# Patient Record
Sex: Male | Born: 1970 | Race: Black or African American | Hispanic: No | Smoking: Never smoker
Health system: Southern US, Community
[De-identification: ages and names within clinical notes are randomized; demographics above are authoritative.]

## PROBLEM LIST (undated history)

## (undated) DIAGNOSIS — I10 Essential (primary) hypertension: Secondary | ICD-10-CM

## (undated) DIAGNOSIS — H538 Other visual disturbances: Secondary | ICD-10-CM

## (undated) DIAGNOSIS — R42 Dizziness and giddiness: Secondary | ICD-10-CM

## (undated) HISTORY — DX: Other visual disturbances: H53.8

## (undated) HISTORY — DX: Dizziness and giddiness: R42

---

## 2017-11-16 ENCOUNTER — Inpatient Hospital Stay (HOSPITAL_COMMUNITY)
Admission: EM | Admit: 2017-11-16 | Discharge: 2017-12-04 | DRG: 193 | Disposition: A | Discharge status: Home / Self Care | Payer: Self-pay | Attending: Family Medicine | Admitting: Family Medicine

## 2017-11-16 ENCOUNTER — Other Ambulatory Visit: Payer: Self-pay

## 2017-11-16 ENCOUNTER — Emergency Department (HOSPITAL_COMMUNITY): Payer: Self-pay

## 2017-11-16 ENCOUNTER — Encounter (HOSPITAL_COMMUNITY): Payer: Self-pay

## 2017-11-16 DIAGNOSIS — J9601 Acute respiratory failure with hypoxia: Secondary | ICD-10-CM

## 2017-11-16 DIAGNOSIS — J9690 Respiratory failure, unspecified, unspecified whether with hypoxia or hypercapnia: Secondary | ICD-10-CM

## 2017-11-16 DIAGNOSIS — I480 Paroxysmal atrial fibrillation: Secondary | ICD-10-CM | POA: Diagnosis present

## 2017-11-16 DIAGNOSIS — J90 Pleural effusion, not elsewhere classified: Secondary | ICD-10-CM

## 2017-11-16 DIAGNOSIS — R030 Elevated blood-pressure reading, without diagnosis of hypertension: Secondary | ICD-10-CM

## 2017-11-16 DIAGNOSIS — R748 Abnormal levels of other serum enzymes: Secondary | ICD-10-CM

## 2017-11-16 DIAGNOSIS — Z8249 Family history of ischemic heart disease and other diseases of the circulatory system: Secondary | ICD-10-CM

## 2017-11-16 DIAGNOSIS — I10 Essential (primary) hypertension: Secondary | ICD-10-CM

## 2017-11-16 DIAGNOSIS — R0600 Dyspnea, unspecified: Secondary | ICD-10-CM

## 2017-11-16 DIAGNOSIS — R079 Chest pain, unspecified: Secondary | ICD-10-CM

## 2017-11-16 DIAGNOSIS — Z9889 Other specified postprocedural states: Secondary | ICD-10-CM

## 2017-11-16 DIAGNOSIS — J189 Pneumonia, unspecified organism: Secondary | ICD-10-CM | POA: Diagnosis present

## 2017-11-16 DIAGNOSIS — F419 Anxiety disorder, unspecified: Secondary | ICD-10-CM | POA: Diagnosis present

## 2017-11-16 DIAGNOSIS — R740 Nonspecific elevation of levels of transaminase and lactic acid dehydrogenase [LDH]: Secondary | ICD-10-CM | POA: Diagnosis present

## 2017-11-16 DIAGNOSIS — R0602 Shortness of breath: Secondary | ICD-10-CM

## 2017-11-16 DIAGNOSIS — A1889 Tuberculosis of other sites: Secondary | ICD-10-CM

## 2017-11-16 DIAGNOSIS — I4892 Unspecified atrial flutter: Secondary | ICD-10-CM | POA: Diagnosis present

## 2017-11-16 DIAGNOSIS — I313 Pericardial effusion (noninflammatory): Secondary | ICD-10-CM | POA: Diagnosis present

## 2017-11-16 DIAGNOSIS — J180 Bronchopneumonia, unspecified organism: Principal | ICD-10-CM | POA: Diagnosis present

## 2017-11-16 DIAGNOSIS — A159 Respiratory tuberculosis unspecified: Secondary | ICD-10-CM | POA: Diagnosis present

## 2017-11-16 DIAGNOSIS — J45909 Unspecified asthma, uncomplicated: Secondary | ICD-10-CM | POA: Diagnosis present

## 2017-11-16 HISTORY — DX: Essential (primary) hypertension: I10

## 2017-11-16 LAB — HEPATIC FUNCTION PANEL
ALT: 33 U/L (ref 17–63)
AST: 20 U/L (ref 15–41)
Albumin: 2.5 g/dL — ABNORMAL LOW (ref 3.5–5.0)
Alkaline Phosphatase: 138 U/L — ABNORMAL HIGH (ref 38–126)
BILIRUBIN DIRECT: 0.2 mg/dL (ref 0.1–0.5)
Indirect Bilirubin: 0.6 mg/dL (ref 0.3–0.9)
Total Bilirubin: 0.8 mg/dL (ref 0.3–1.2)
Total Protein: 7.1 g/dL (ref 6.5–8.1)

## 2017-11-16 LAB — BASIC METABOLIC PANEL
ANION GAP: 11 (ref 5–15)
BUN: 10 mg/dL (ref 6–20)
CHLORIDE: 103 mmol/L (ref 101–111)
CO2: 23 mmol/L (ref 22–32)
Calcium: 8.5 mg/dL — ABNORMAL LOW (ref 8.9–10.3)
Creatinine, Ser: 1.17 mg/dL (ref 0.61–1.24)
Glucose, Bld: 108 mg/dL — ABNORMAL HIGH (ref 65–99)
POTASSIUM: 3.9 mmol/L (ref 3.5–5.1)
SODIUM: 137 mmol/L (ref 135–145)

## 2017-11-16 LAB — BRAIN NATRIURETIC PEPTIDE: B NATRIURETIC PEPTIDE 5: 99.3 pg/mL (ref 0.0–100.0)

## 2017-11-16 LAB — I-STAT TROPONIN, ED: Troponin i, poc: 0 ng/mL (ref 0.00–0.08)

## 2017-11-16 LAB — CBC
HEMATOCRIT: 36 % — AB (ref 39.0–52.0)
HEMOGLOBIN: 11.7 g/dL — AB (ref 13.0–17.0)
MCH: 25.6 pg — ABNORMAL LOW (ref 26.0–34.0)
MCHC: 32.5 g/dL (ref 30.0–36.0)
MCV: 78.8 fL (ref 78.0–100.0)
Platelets: 457 10*3/uL — ABNORMAL HIGH (ref 150–400)
RBC: 4.57 MIL/uL (ref 4.22–5.81)
RDW: 13.1 % (ref 11.5–15.5)
WBC: 12.8 10*3/uL — AB (ref 4.0–10.5)

## 2017-11-16 LAB — I-STAT CG4 LACTIC ACID, ED
Lactic Acid, Venous: 0.64 mmol/L (ref 0.5–1.9)
Lactic Acid, Venous: 0.91 mmol/L (ref 0.5–1.9)

## 2017-11-16 LAB — TSH: TSH: 1.481 u[IU]/mL (ref 0.350–4.500)

## 2017-11-16 MED ORDER — LEVOFLOXACIN IN D5W 750 MG/150ML IV SOLN
750.0000 mg | Freq: Once | INTRAVENOUS | Status: AC
  Administered 2017-11-16: 750 mg via INTRAVENOUS
  Filled 2017-11-16: qty 150

## 2017-11-16 MED ORDER — HYDRALAZINE HCL 20 MG/ML IJ SOLN
10.0000 mg | Freq: Once | INTRAMUSCULAR | Status: AC
  Administered 2017-11-16: 10 mg via INTRAVENOUS
  Filled 2017-11-16: qty 1

## 2017-11-16 MED ORDER — SODIUM CHLORIDE 0.9 % IV SOLN: 250.0000 mL | INTRAVENOUS | Status: DC | PRN

## 2017-11-16 MED ORDER — IOPAMIDOL (ISOVUE-300) INJECTION 61%
INTRAVENOUS | Status: AC
  Administered 2017-11-16: 100 mL
  Filled 2017-11-16: qty 100

## 2017-11-16 MED ORDER — ACETAMINOPHEN 325 MG PO TABS: 650.0000 mg | ORAL_TABLET | Freq: Four times a day (QID) | ORAL | Status: DC | PRN

## 2017-11-16 MED ORDER — ONDANSETRON HCL 4 MG/2ML IJ SOLN: 4.0000 mg | Freq: Four times a day (QID) | INTRAMUSCULAR | Status: DC | PRN

## 2017-11-16 MED ORDER — SODIUM CHLORIDE 0.9 % IV BOLUS (SEPSIS): 1000.0000 mL | Freq: Once | INTRAVENOUS | Status: DC

## 2017-11-16 MED ORDER — SODIUM CHLORIDE 0.9% FLUSH: 3.0000 mL | INTRAVENOUS | Status: DC | PRN

## 2017-11-16 MED ORDER — ACETAMINOPHEN 650 MG RE SUPP: 650.0000 mg | Freq: Four times a day (QID) | RECTAL | Status: DC | PRN

## 2017-11-16 MED ORDER — HYDROCODONE-ACETAMINOPHEN 5-325 MG PO TABS
1.0000 | ORAL_TABLET | ORAL | Status: DC | PRN
  Administered 2017-11-16 – 2017-11-17 (×2): 2 via ORAL
  Administered 2017-11-18: 1 via ORAL
  Administered 2017-11-18: 2 via ORAL
  Administered 2017-11-19: 1 via ORAL
  Administered 2017-11-19: 2 via ORAL
  Administered 2017-11-20 (×3): 1 via ORAL
  Administered 2017-11-21 (×3): 2 via ORAL
  Administered 2017-11-22: 1 via ORAL
  Administered 2017-11-22 – 2017-11-24 (×3): 2 via ORAL
  Filled 2017-11-16: qty 1
  Filled 2017-11-16 (×4): qty 2
  Filled 2017-11-16: qty 1
  Filled 2017-11-16 (×3): qty 2
  Filled 2017-11-16: qty 1
  Filled 2017-11-16 (×2): qty 2
  Filled 2017-11-16: qty 1
  Filled 2017-11-16 (×4): qty 2

## 2017-11-16 MED ORDER — ONDANSETRON HCL 4 MG PO TABS: 4.0000 mg | ORAL_TABLET | Freq: Four times a day (QID) | ORAL | Status: DC | PRN

## 2017-11-16 MED ORDER — SODIUM CHLORIDE 0.9% FLUSH
3.0000 mL | Freq: Two times a day (BID) | INTRAVENOUS | Status: DC
  Administered 2017-11-16 – 2017-12-03 (×32): 3 mL via INTRAVENOUS

## 2017-11-16 MED ORDER — MORPHINE SULFATE (PF) 4 MG/ML IV SOLN
4.0000 mg | Freq: Once | INTRAVENOUS | Status: AC
  Administered 2017-11-16: 4 mg via INTRAVENOUS
  Filled 2017-11-16: qty 1

## 2017-11-16 MED ORDER — LEVOFLOXACIN IN D5W 750 MG/150ML IV SOLN
750.0000 mg | INTRAVENOUS | Status: DC
  Administered 2017-11-17: 750 mg via INTRAVENOUS
  Filled 2017-11-16: qty 150

## 2017-11-16 NOTE — ED Triage Notes (Addendum)
Pt. Reports having chest pain and sob for 3 weeks.  The pain is in the center of his chest into his back.  Sharp pain, Pt. Also reports having cough and cold symptoms.  Pt. Is taking Goody Powders which relieves the pain. Pt. Is alert and oriented X4.Marland Kitchen. ECG and labs completed in Triage.  Pt. Also reports having pain with movement.

## 2017-11-16 NOTE — ED Notes (Signed)
Pt care assumed.  Verbal report obtained.  Pt is resting and appears comfortable.  Pt is requesting for something to eat.  Made pt aware that he is only ordered to have full liquid diet.  He requested water.  Pt reports R upper cp which he reports was the reason he came to the ED today.  Pt is A&Ox 4.

## 2017-11-16 NOTE — ED Notes (Signed)
PA at the bedside.

## 2017-11-16 NOTE — ED Notes (Signed)
Pt returned from CT °

## 2017-11-16 NOTE — H&P (Signed)
History and Physical    Scott Rowe RUE:[id number] DOB: 04/27/1971 DOA: 11/16/2017  PCP: Patient, No Pcp Per  Patient coming from: Home  Chief Complaint: Shortness of breath and cough  HPI: Scott Rowe is a 47 y.o. male with medical history significant of  hypertension comes in with 3 weeks of worsening shortness of breath and cough.  Patient reports that he has been coughing which has been productive and having a difficulty breathing with chest pain for 3 weeks now.  The pain is worse with deep inspiration.  He denies any fevers.  He has been having chills.  He denies any lower extremity edema.  He does not have a history of any congestive heart failure.  He does have hypertension.  He denies any nausea vomiting or diarrhea.  Patient found to have pneumonia and a left sided pleural effusion.  He is hypoxic with exertion.  He is referred for admission for community acquired pneumonia.  He has not seen a physician for this and is not been on antibiotics in over a year.  He has not had any recent hospitalizations.  Review of Systems: As per HPI otherwise 10 point review of systems negative.   Past Medical History:  Diagnosis Date  . Hypertension     History reviewed. No pertinent surgical history.   reports that  has never smoked. he has never used smokeless tobacco. He reports that he does not drink alcohol or use drugs.  No Known Allergies  No family history on file.  No premature coronary artery disease  Prior to Admission medications   Not on File  None  Physical Exam: Vitals:   11/16/17 1330 11/16/17 1345 11/16/17 1400 11/16/17 1445  BP: (!) 158/108 (!) 154/117 (!) 164/117 (!) 170/116  Pulse: (!) 108 (!) 106 (!) 108 (!) 102  Resp:      Temp:      TempSrc:      SpO2: 98% 98% 98% 96%  Weight:      Height:          Constitutional: NAD, calm, comfortable Vitals:   11/16/17 1330 11/16/17 1345 11/16/17 1400 11/16/17 1445  BP: (!) 158/108 (!) 154/117 (!) 164/117 (!) 170/116   Pulse: (!) 108 (!) 106 (!) 108 (!) 102  Resp:      Temp:      TempSrc:      SpO2: 98% 98% 98% 96%  Weight:      Height:       Eyes: PERRL, lids and conjunctivae normal ENMT: Mucous membranes are moist. Posterior pharynx clear of any exudate or lesions.Normal dentition.  Neck: normal, supple, no masses, no thyromegaly Respiratory: clear to auscultation  on the right decreased on the left to mid lung, no wheezing, no crackles. Normal respiratory effort. No accessory muscle use.  Cardiovascular: Regular rate and rhythm, no murmurs / rubs / gallops. No extremity edema. 2+ pedal pulses. No carotid bruits.  Abdomen: no tenderness, no masses palpated. No hepatosplenomegaly. Bowel sounds positive.  Musculoskeletal: no clubbing / cyanosis. No joint deformity upper and lower extremities. Good ROM, no contractures. Normal muscle tone.  Skin: no rashes, lesions, ulcers. No induration Neurologic: CN 2-12 grossly intact. Sensation intact, DTR normal. Strength 5/5 in all 4.  Psychiatric: Normal judgment and insight. Alert and oriented x 3. Normal mood.    Labs on Admission: I have personally reviewed following labs and imaging studies  CBC: Recent Labs  Lab 11/16/17 1108  WBC 12.8*  HGB 11.7*  HCT  36.0*  MCV 78.8  PLT 457*   Basic Metabolic Panel: Recent Labs  Lab 11/16/17 1108  NA 137  K 3.9  CL 103  CO2 23  GLUCOSE 108*  BUN 10  CREATININE 1.17  CALCIUM 8.5*   GFR: Estimated Creatinine Clearance: 86 mL/min (by C-G formula based on SCr of 1.17 mg/dL). Liver Function Tests: Recent Labs  Lab 11/16/17 1256  AST 20  ALT 33  ALKPHOS 138*  BILITOT 0.8  PROT 7.1  ALBUMIN 2.5*   No results for input(s): LIPASE, AMYLASE in the last 168 hours. No results for input(s): AMMONIA in the last 168 hours. Coagulation Profile: No results for input(s): INR, PROTIME in the last 168 hours. Cardiac Enzymes: No results for input(s): CKTOTAL, CKMB, CKMBINDEX, TROPONINI in the last 168  hours. BNP (last 3 results) No results for input(s): PROBNP in the last 8760 hours. HbA1C: No results for input(s): HGBA1C in the last 72 hours. CBG: No results for input(s): GLUCAP in the last 168 hours. Lipid Profile: No results for input(s): CHOL, HDL, LDLCALC, TRIG, CHOLHDL, LDLDIRECT in the last 72 hours. Thyroid Function Tests: No results for input(s): TSH, T4TOTAL, FREET4, T3FREE, THYROIDAB in the last 72 hours. Anemia Panel: No results for input(s): VITAMINB12, FOLATE, FERRITIN, TIBC, IRON, RETICCTPCT in the last 72 hours. Urine analysis: No results found for: COLORURINE, APPEARANCEUR, LABSPEC, PHURINE, GLUCOSEU, HGBUR, BILIRUBINUR, KETONESUR, PROTEINUR, UROBILINOGEN, NITRITE, LEUKOCYTESUR Sepsis Labs: !!!!!!!!!!!!!!!!!!!!!!!!!!!!!!!!!!!!!!!!!!!! @LABRCNTIP (procalcitonin:4,lacticidven:4) )No results found for this or any previous visit (from the past 240 hour(s)).   Radiological Exams on Admission: Dg Chest 2 View  Result Date: 11/16/2017 CLINICAL DATA:  Chest pain and shortness of breath for several weeks EXAM: CHEST  2 VIEW COMPARISON:  None. FINDINGS: Cardiac shadow is mildly enlarged. Small right-sided pleural effusion is seen. A large left-sided pleural effusion is noted with likely underlying infiltrate. No pneumothorax is seen. No bony abnormality is noted. IMPRESSION: Large left pleural effusion with likely underlying infiltrate. Minimal blunting of the right costophrenic angle is noted as well. Electronically Signed   By: Alcide Clever M.D.   On: 11/16/2017 11:55   Ct Chest W Contrast  Result Date: 11/16/2017 CLINICAL DATA:  RIGHT upper quadrant RIGHT-side chest pain with shortness of breath for 2 weeks, severe chest pain when lying flat, unable to raise RIGHT arm above head, shortness of breath for 2 weeks, history hypertension EXAM: CT CHEST, ABDOMEN, AND PELVIS WITH CONTRAST TECHNIQUE: Multidetector CT imaging of the chest, abdomen and pelvis was performed following the  standard protocol during bolus administration of intravenous contrast. Sagittal and coronal MPR images reconstructed from axial data set. Patient was unable to raise his RIGHT arm above his head, resulting in beam hardening artifacts at the chest and abdomen. CONTRAST:  ISOVUE-300 IOPAMIDOL (ISOVUE-300) INJECTION 61% IV. No oral contrast administered. COMPARISON:  None FINDINGS: CT CHEST FINDINGS Cardiovascular: Thoracic vascular structures grossly patent on nondedicated exam. Small pericardial effusion. Mediastinum/Nodes: Normal sized RIGHT cardiophrenic angle lymph nodes. Enlarged azygo-esophageal recess node 16 mm short axis image 32. Minimally enlarged RIGHT paratracheal node 11 mm short axis image 26. No definite esophageal abnormalities. Base of cervical region unremarkable. Lungs/Pleura: Large LEFT pleural effusion with subtotal atelectasis of LEFT lower lobe and partial atelectasis of LEFT upper lobe in lingula. Small RIGHT pleural effusion with minimal compressive atelectasis of adjacent posterior RIGHT lower lobe base. Scattered respiratory motion artifacts. No definite infiltrate or pneumothorax. Musculoskeletal: No acute osseous abnormalities. CT ABDOMEN PELVIS FINDINGS Hepatobiliary: Gallbladder and liver normal appearance  Pancreas: Normal appearance Spleen: Normal appearance Adrenals/Urinary Tract: Adrenal glands, kidneys, ureters, and bladder normal appearance Stomach/Bowel: Normal appearing retrocecal appendix. Distal stomach incompletely distended. Stomach and bowel loops otherwise normal appearance for technique Vascular/Lymphatic: Vascular structures normal appearance. No adenopathy. Reproductive: Minimal prostatic enlargement. Other: No free air, free fluid, hernia, or inflammatory process Musculoskeletal: No acute osseous abnormalities. IMPRESSION: Large LEFT pleural effusion with significant atelectasis of the LEFT lung. Small RIGHT pleural effusion with minimal atelectasis of the  adjacent RIGHT lower lobe. Nonspecific mildly enlarged mediastinal lymph nodes. No acute intra-abdominal or intrapelvic abnormalities. No acute osseous abnormalities. Electronically Signed   By: Ulyses Southward M.D.   On: 11/16/2017 13:53   Ct Abdomen Pelvis W Contrast  Result Date: 11/16/2017 CLINICAL DATA:  RIGHT upper quadrant RIGHT-side chest pain with shortness of breath for 2 weeks, severe chest pain when lying flat, unable to raise RIGHT arm above head, shortness of breath for 2 weeks, history hypertension EXAM: CT CHEST, ABDOMEN, AND PELVIS WITH CONTRAST TECHNIQUE: Multidetector CT imaging of the chest, abdomen and pelvis was performed following the standard protocol during bolus administration of intravenous contrast. Sagittal and coronal MPR images reconstructed from axial data set. Patient was unable to raise his RIGHT arm above his head, resulting in beam hardening artifacts at the chest and abdomen. CONTRAST:  ISOVUE-300 IOPAMIDOL (ISOVUE-300) INJECTION 61% IV. No oral contrast administered. COMPARISON:  None FINDINGS: CT CHEST FINDINGS Cardiovascular: Thoracic vascular structures grossly patent on nondedicated exam. Small pericardial effusion. Mediastinum/Nodes: Normal sized RIGHT cardiophrenic angle lymph nodes. Enlarged azygo-esophageal recess node 16 mm short axis image 32. Minimally enlarged RIGHT paratracheal node 11 mm short axis image 26. No definite esophageal abnormalities. Base of cervical region unremarkable. Lungs/Pleura: Large LEFT pleural effusion with subtotal atelectasis of LEFT lower lobe and partial atelectasis of LEFT upper lobe in lingula. Small RIGHT pleural effusion with minimal compressive atelectasis of adjacent posterior RIGHT lower lobe base. Scattered respiratory motion artifacts. No definite infiltrate or pneumothorax. Musculoskeletal: No acute osseous abnormalities. CT ABDOMEN PELVIS FINDINGS Hepatobiliary: Gallbladder and liver normal appearance Pancreas: Normal  appearance Spleen: Normal appearance Adrenals/Urinary Tract: Adrenal glands, kidneys, ureters, and bladder normal appearance Stomach/Bowel: Normal appearing retrocecal appendix. Distal stomach incompletely distended. Stomach and bowel loops otherwise normal appearance for technique Vascular/Lymphatic: Vascular structures normal appearance. No adenopathy. Reproductive: Minimal prostatic enlargement. Other: No free air, free fluid, hernia, or inflammatory process Musculoskeletal: No acute osseous abnormalities. IMPRESSION: Large LEFT pleural effusion with significant atelectasis of the LEFT lung. Small RIGHT pleural effusion with minimal atelectasis of the adjacent RIGHT lower lobe. Nonspecific mildly enlarged mediastinal lymph nodes. No acute intra-abdominal or intrapelvic abnormalities. No acute osseous abnormalities. Electronically Signed   By: Ulyses Southward M.D.   On: 11/16/2017 13:53    EKG: Independently reviewed.  Sinus tachycardia T wave inversion in lateral leads Case discussed with EDP Chest x-ray reviewed with bilateral pleural effusions larger on the right  Assessment/Plan 47 year old male with bilateral pneumonia with associated large left pleural effusion with a small right pleural effusion Principal Problem:   PNA (pneumonia)-placed on IV Levaquin.  Place on telemetry bed.  Check BNP.  Obtain blood and sputum cultures.  Supplemental oxygen as needed.  Depending on how patient responds to antibiotics he may need a thoracentesis.  He is currently stable.  Check HIV.  Active Problems:   Acute respiratory failure with hypoxia (HCC)-due to pneumonia.  Highly suspect this is pneumonia.  However cannot be certain not congestive heart failure will obtain a  cardiac echo to ascertain systolic function.   Hypertension-noted not on any home meds for this.  Monitor while in the hospital.  We will not start anything at this time.   Pleural effusion-as above    DVT prophylaxis: SCDs Code Status:  Full Family Communication: Significant other Disposition Plan: Per day team Consults called: None Admission status: Admission   Pauletta Pickney A MD [facility]  If 7PM-7AM, please contact night-coverage [email or web address] Password [facility]  11/16/2017, 3:15 PM

## 2017-11-16 NOTE — ED Provider Notes (Signed)
[facility] EMERGENCY DEPARTMENT Provider Note   CSN: [id number] Arrival date & time: 11/16/17  1057     History   Chief Complaint Chief Complaint  Patient presents with  . Chest Pain  . Shortness of Breath    HPI Scott Rowe is a 47 y.o. male.  The history is provided by the patient and medical records. No language interpreter was used.  Chest Pain   Associated symptoms include cough and shortness of breath. Pertinent negatives include no abdominal pain, no fever, no nausea, no palpitations and no vomiting.  Shortness of Breath  Associated symptoms include cough and chest pain. Pertinent negatives include no fever, no vomiting, no abdominal pain and no leg swelling.   Scott Rowe is a 47 y.o. male  with a PMH of HTN who presents to the Emergency Department complaining of progressively worsening shortness of breath x 3 weeks. Associated with chest pain and dry cough. He feels as if he is going to get mucus up, but cough is not productive. Chest pain worse with deep breaths. Shortness of breath gets worse when he lies down. He has not been able to sleep because shortness of breath gets severe when lying down. Denies fever / chills. No known sick contacts. He started taking emergen-c about two weeks ago. When he started this, he felt as if his stomach was getting tight. Over the last two weeks, he has felt as if his stomach is getting "hard like a rock" which is new for him. He denies abdominal pain, nausea, vomiting, diarrhea or constipation. He also has been taking Goody's powder for chest pain and felt like this helped with the pain.   Past Medical History:  Diagnosis Date  . Hypertension     There are no active problems to display for this patient.   History reviewed. No pertinent surgical history.     Home Medications    Prior to Admission medications   Not on File    Family History No family history on file.  Social History Social History    Tobacco Use  . Smoking status: Never Smoker  . Smokeless tobacco: Never Used  Substance Use Topics  . Alcohol use: No    Frequency: Never  . Drug use: No     Allergies   Patient has no known allergies.   Review of Systems Review of Systems  Constitutional: Negative for fever.  Respiratory: Positive for cough and shortness of breath.   Cardiovascular: Positive for chest pain. Negative for palpitations and leg swelling.  Gastrointestinal: Positive for abdominal distention. Negative for abdominal pain, blood in stool, constipation, diarrhea, nausea and vomiting.  All other systems reviewed and are negative.    Physical Exam Updated Vital Signs BP (!) 164/117   Pulse (!) 108   Temp 97.8 F (36.6 C) (Oral)   Resp 18   Ht 5\' 7"  (1.702 m)   Wt 91.6 kg (202 lb)   SpO2 98%   BMI 31.64 kg/m   Physical Exam  Constitutional: He is oriented to person, place, and time. He appears well-developed and well-nourished. No distress.  HENT:  Head: Normocephalic and atraumatic.  Cardiovascular: Normal heart sounds.  No murmur heard. Tachycardic but regular.  Pulmonary/Chest: Effort normal. No respiratory distress.  Breath sounds diminished at left base. No wheezing. Speaking in full sentences without difficulty on RA.  Abdominal: Soft. Bowel sounds are normal. There is no tenderness.  Musculoskeletal:  No lower extremity edema.  Neurological: He is  alert and oriented to person, place, and time.  Skin: Skin is warm and dry.  Nursing note and vitals reviewed.    ED Treatments / Results  Labs (all labs ordered are listed, but only abnormal results are displayed) Labs Reviewed  BASIC METABOLIC PANEL - Abnormal; Notable for the following components:      Result Value   Glucose, Bld 108 (*)    Calcium 8.5 (*)    All other components within normal limits  CBC - Abnormal; Notable for the following components:   WBC 12.8 (*)    Hemoglobin 11.7 (*)    HCT 36.0 (*)    MCH 25.6  (*)    Platelets 457 (*)    All other components within normal limits  HEPATIC FUNCTION PANEL - Abnormal; Notable for the following components:   Albumin 2.5 (*)    Alkaline Phosphatase 138 (*)    All other components within normal limits  CULTURE, BLOOD (ROUTINE X 2)  CULTURE, BLOOD (ROUTINE X 2)  BRAIN NATRIURETIC PEPTIDE  I-STAT TROPONIN, ED  I-STAT CG4 LACTIC ACID, ED  I-STAT CG4 LACTIC ACID, ED    EKG  EKG Interpretation  Date/Time:  Monday November 16 2017 11:02:40 EST Ventricular Rate:  113 PR Interval:  144 QRS Duration: 72 QT Interval:  312 QTC Calculation: 427 R Axis:   44 Text Interpretation:  Sinus tachycardia T wave abnormality, consider inferior ischemia Abnormal ECG No old tracing to compare Confirmed by Pricilla Loveless [phone]) on 11/16/2017 12:46:00 PM       Radiology Dg Chest 2 View  Result Date: 11/16/2017 CLINICAL DATA:  Chest pain and shortness of breath for several weeks EXAM: CHEST  2 VIEW COMPARISON:  None. FINDINGS: Cardiac shadow is mildly enlarged. Small right-sided pleural effusion is seen. A large left-sided pleural effusion is noted with likely underlying infiltrate. No pneumothorax is seen. No bony abnormality is noted. IMPRESSION: Large left pleural effusion with likely underlying infiltrate. Minimal blunting of the right costophrenic angle is noted as well. Electronically Signed   By: Alcide Clever M.D.   On: 11/16/2017 11:55   Ct Chest W Contrast  Result Date: 11/16/2017 CLINICAL DATA:  RIGHT upper quadrant RIGHT-side chest pain with shortness of breath for 2 weeks, severe chest pain when lying flat, unable to raise RIGHT arm above head, shortness of breath for 2 weeks, history hypertension EXAM: CT CHEST, ABDOMEN, AND PELVIS WITH CONTRAST TECHNIQUE: Multidetector CT imaging of the chest, abdomen and pelvis was performed following the standard protocol during bolus administration of intravenous contrast. Sagittal and coronal MPR images reconstructed  from axial data set. Patient was unable to raise his RIGHT arm above his head, resulting in beam hardening artifacts at the chest and abdomen. CONTRAST:  ISOVUE-300 IOPAMIDOL (ISOVUE-300) INJECTION 61% IV. No oral contrast administered. COMPARISON:  None FINDINGS: CT CHEST FINDINGS Cardiovascular: Thoracic vascular structures grossly patent on nondedicated exam. Small pericardial effusion. Mediastinum/Nodes: Normal sized RIGHT cardiophrenic angle lymph nodes. Enlarged azygo-esophageal recess node 16 mm short axis image 32. Minimally enlarged RIGHT paratracheal node 11 mm short axis image 26. No definite esophageal abnormalities. Base of cervical region unremarkable. Lungs/Pleura: Large LEFT pleural effusion with subtotal atelectasis of LEFT lower lobe and partial atelectasis of LEFT upper lobe in lingula. Small RIGHT pleural effusion with minimal compressive atelectasis of adjacent posterior RIGHT lower lobe base. Scattered respiratory motion artifacts. No definite infiltrate or pneumothorax. Musculoskeletal: No acute osseous abnormalities. CT ABDOMEN PELVIS FINDINGS Hepatobiliary: Gallbladder and liver normal appearance  Pancreas: Normal appearance Spleen: Normal appearance Adrenals/Urinary Tract: Adrenal glands, kidneys, ureters, and bladder normal appearance Stomach/Bowel: Normal appearing retrocecal appendix. Distal stomach incompletely distended. Stomach and bowel loops otherwise normal appearance for technique Vascular/Lymphatic: Vascular structures normal appearance. No adenopathy. Reproductive: Minimal prostatic enlargement. Other: No free air, free fluid, hernia, or inflammatory process Musculoskeletal: No acute osseous abnormalities. IMPRESSION: Large LEFT pleural effusion with significant atelectasis of the LEFT lung. Small RIGHT pleural effusion with minimal atelectasis of the adjacent RIGHT lower lobe. Nonspecific mildly enlarged mediastinal lymph nodes. No acute intra-abdominal or intrapelvic  abnormalities. No acute osseous abnormalities. Electronically Signed   By: Ulyses SouthwardMark  Boles M.D.   On: 11/16/2017 13:53   Ct Abdomen Pelvis W Contrast  Result Date: 11/16/2017 CLINICAL DATA:  RIGHT upper quadrant RIGHT-side chest pain with shortness of breath for 2 weeks, severe chest pain when lying flat, unable to raise RIGHT arm above head, shortness of breath for 2 weeks, history hypertension EXAM: CT CHEST, ABDOMEN, AND PELVIS WITH CONTRAST TECHNIQUE: Multidetector CT imaging of the chest, abdomen and pelvis was performed following the standard protocol during bolus administration of intravenous contrast. Sagittal and coronal MPR images reconstructed from axial data set. Patient was unable to raise his RIGHT arm above his head, resulting in beam hardening artifacts at the chest and abdomen. CONTRAST:  100mL ISOVUE-300 IOPAMIDOL (ISOVUE-300) INJECTION 61% IV. No oral contrast administered. COMPARISON:  None FINDINGS: CT CHEST FINDINGS Cardiovascular: Thoracic vascular structures grossly patent on nondedicated exam. Small pericardial effusion. Mediastinum/Nodes: Normal sized RIGHT cardiophrenic angle lymph nodes. Enlarged azygo-esophageal recess node 16 mm short axis image 32. Minimally enlarged RIGHT paratracheal node 11 mm short axis image 26. No definite esophageal abnormalities. Base of cervical region unremarkable. Lungs/Pleura: Large LEFT pleural effusion with subtotal atelectasis of LEFT lower lobe and partial atelectasis of LEFT upper lobe in lingula. Small RIGHT pleural effusion with minimal compressive atelectasis of adjacent posterior RIGHT lower lobe base. Scattered respiratory motion artifacts. No definite infiltrate or pneumothorax. Musculoskeletal: No acute osseous abnormalities. CT ABDOMEN PELVIS FINDINGS Hepatobiliary: Gallbladder and liver normal appearance Pancreas: Normal appearance Spleen: Normal appearance Adrenals/Urinary Tract: Adrenal glands, kidneys, ureters, and bladder normal appearance  Stomach/Bowel: Normal appearing retrocecal appendix. Distal stomach incompletely distended. Stomach and bowel loops otherwise normal appearance for technique Vascular/Lymphatic: Vascular structures normal appearance. No adenopathy. Reproductive: Minimal prostatic enlargement. Other: No free air, free fluid, hernia, or inflammatory process Musculoskeletal: No acute osseous abnormalities. IMPRESSION: Large LEFT pleural effusion with significant atelectasis of the LEFT lung. Small RIGHT pleural effusion with minimal atelectasis of the adjacent RIGHT lower lobe. Nonspecific mildly enlarged mediastinal lymph nodes. No acute intra-abdominal or intrapelvic abnormalities. No acute osseous abnormalities. Electronically Signed   By: Ulyses SouthwardMark  Boles M.D.   On: 11/16/2017 13:53    Procedures Procedures (including critical care time)  Medications Ordered in ED Medications  levofloxacin (LEVAQUIN) IVPB 750 mg (750 mg Intravenous New Bag/Given 11/16/17 1433)  iopamidol (ISOVUE-300) 61 % injection (100 mLs  Contrast Given 11/16/17 1330)  morphine 4 MG/ML injection 4 mg (4 mg Intravenous Given 11/16/17 1433)     Initial Impression / Assessment and Plan / ED Course  I have reviewed the triage vital signs and the nursing notes.  Pertinent labs & imaging results that were available during my care of the patient were reviewed by me and considered in my medical decision making (see chart for details).    Scott Rowe is a 47 y.o. male who presents to ED for progressively worsening shortness of breath  x 3 weeks. CXR shows large pleural effusion with likely underlying infiltrate and minimal blunting of the right costophrenic angle as well. He was also complaining of abdominal fullness over the last two weeks. CT chest/abd/pelvis obtained. No acute abdominal process noted. CT further clarifies large leuft pleural effusion with significant atelectasis and small right pleural effusion as well. Mild leukocytosis. Lactic normal. Did  obtain blood cultures and started on ABX. Hospitalist consulted who will admit.   Patient discussed with Dr. Criss Alvine who agrees with treatment plan.    Final Clinical Impressions(s) / ED Diagnoses   Final diagnoses:  Pleural effusion  Shortness of breath  Elevated blood pressure reading    ED Discharge Orders    None       Ward, Chase Picket, PA-C 11/16/17 1446    Pricilla Loveless, MD 11/17/17 [phone]

## 2017-11-16 NOTE — ED Notes (Signed)
Pt taken to CT.

## 2017-11-16 NOTE — ED Notes (Signed)
Attending hospitalist paged re pt's elevated BP.

## 2017-11-17 ENCOUNTER — Other Ambulatory Visit (HOSPITAL_COMMUNITY): Payer: Self-pay

## 2017-11-17 LAB — BASIC METABOLIC PANEL
Anion gap: 15 (ref 5–15)
BUN: 10 mg/dL (ref 6–20)
CHLORIDE: 101 mmol/L (ref 101–111)
CO2: 20 mmol/L — ABNORMAL LOW (ref 22–32)
Calcium: 8.5 mg/dL — ABNORMAL LOW (ref 8.9–10.3)
Creatinine, Ser: 0.92 mg/dL (ref 0.61–1.24)
GFR calc Af Amer: >60 mL/min (ref >60)
GFR calc non Af Amer: >60 mL/min (ref >60)
Glucose, Bld: 99 mg/dL (ref 65–99)
POTASSIUM: 4 mmol/L (ref 3.5–5.1)
SODIUM: 136 mmol/L (ref 135–145)

## 2017-11-17 LAB — CBC
HEMATOCRIT: 36.4 % — AB (ref 39.0–52.0)
Hemoglobin: 11.8 g/dL — ABNORMAL LOW (ref 13.0–17.0)
MCH: 25.4 pg — ABNORMAL LOW (ref 26.0–34.0)
MCHC: 32.4 g/dL (ref 30.0–36.0)
MCV: 78.3 fL (ref 78.0–100.0)
Platelets: 439 10*3/uL — ABNORMAL HIGH (ref 150–400)
RBC: 4.65 MIL/uL (ref 4.22–5.81)
RDW: 13.2 % (ref 11.5–15.5)
WBC: 14.1 10*3/uL — ABNORMAL HIGH (ref 4.0–10.5)

## 2017-11-17 LAB — HIV ANTIBODY (ROUTINE TESTING W REFLEX): HIV Screen 4th Generation wRfx: NONREACTIVE

## 2017-11-17 MED ORDER — LEVOFLOXACIN 500 MG PO TABS
500.0000 mg | ORAL_TABLET | Freq: Every day | ORAL | Status: DC
  Administered 2017-11-18 – 2017-11-21 (×4): 500 mg via ORAL
  Filled 2017-11-17 (×5): qty 1

## 2017-11-17 MED ORDER — METOPROLOL TARTRATE 5 MG/5ML IV SOLN
5.0000 mg | Freq: Once | INTRAVENOUS | Status: AC
  Administered 2017-11-17: 5 mg via INTRAVENOUS
  Filled 2017-11-17: qty 5

## 2017-11-17 MED ORDER — METOPROLOL TARTRATE 25 MG PO TABS
25.0000 mg | ORAL_TABLET | Freq: Two times a day (BID) | ORAL | Status: DC
  Administered 2017-11-17 – 2017-11-19 (×4): 25 mg via ORAL
  Filled 2017-11-17 (×4): qty 1

## 2017-11-17 MED ORDER — LABETALOL HCL 5 MG/ML IV SOLN
20.0000 mg | Freq: Once | INTRAVENOUS | Status: AC
  Administered 2017-11-17: 20 mg via INTRAVENOUS
  Filled 2017-11-17: qty 4

## 2017-11-17 MED ORDER — HYDRALAZINE HCL 20 MG/ML IJ SOLN
10.0000 mg | INTRAMUSCULAR | Status: DC | PRN
  Administered 2017-11-18 – 2017-11-19 (×3): 10 mg via INTRAVENOUS
  Filled 2017-11-17 (×4): qty 1

## 2017-11-17 NOTE — Progress Notes (Signed)
PROGRESS NOTE  Scott Rowe  WUJ:[id number] DOB: [birth year] DOA: 11/16/2017 PCP: None Brief Narrative: Joseh Sjogren is a 47 y.o. male with a history of HTN not on medications and asthma who presented to the ED with dyspnea, chest pain and productive cough. CT chest demonstrated L > R pleural effusions with compressive atelectasis vs. infiltrate with nonspecific enlarged LNs. He was hypoxic, started on levaquin and admitted with work up ordered to rule out CHF.   Assessment & Plan: Principal Problem:   PNA (pneumonia) Active Problems:   Acute respiratory failure with hypoxia (HCC)   Hypertension   Pleural effusion  Acute hypoxic respiratory failure: due to pneumonia, atelectasis, pleural effusions. BNP wnl.  - Continue levaquin, transition to po.  - Monitor blood and sputum cultures - If not improving/coming off oxygen, may need diagnostic and therapeutic thoracentesis.  - Echocardiogram ordered  HTN:  - Start metoprolol and monitor - IV hydralazine prn severe range HTN.   DVT prophylaxis: SCDs Code Status: Full Family Communication: None at bedside Disposition Plan: May DC home if stable on po abx.   Consultants:   None  Procedures:   Echocardiogram ordered  Antimicrobials:  Levaquin 1/21 >>    Subjective: Breathing better since admission, but still short of breath with minimal exertion which is new for him. Has been having cough, though less since admission. No current chest pain.   Objective: Vitals:   11/17/17 0455 11/17/17 0538 11/17/17 1205 11/17/17 1424  BP: (!) 146/104 (!) 155/109 (!) 157/103 (!) 157/99  Pulse: (!) 105 (!) 104  (!) 103  Resp: 18   19  Temp: 97.9 F (36.6 C)   98.9 F (37.2 C)  TempSrc: Oral   Oral  SpO2: 97%   99%  Weight:      Height:        Intake/Output Summary (Last 24 hours) at 11/17/2017 1539 Last data filed at 11/17/2017 1424 Gross per 24 hour  Intake 920 ml  Output 350 ml  Net 570 ml   Filed Weights   11/16/17 1110 11/16/17  2236  Weight: 91.6 kg (202 lb) 91.6 kg (201 lb 15.1 oz)    Gen: 47 y.o. male in no distress Pulm: Tachypneic on 2L. Diminished bilaterally, L > R.  CV: Regular tachycardia. No murmur, rub, or gallop. No JVD, no pedal edema. GI: Abdomen soft, non-tender, non-distended, with normoactive bowel sounds. No organomegaly or masses felt. Ext: Warm, no deformities Skin: No rashes, lesions no ulcers Neuro: Alert and oriented. No focal neurological deficits. Psych: Judgement and insight appear normal. Mood & affect appropriate.   Data Reviewed: I have personally reviewed following labs and imaging studies  CBC: Recent Labs  Lab 11/16/17 1108 11/17/17 0428  WBC 12.8* 14.1*  HGB 11.7* 11.8*  HCT 36.0* 36.4*  MCV 78.8 78.3  PLT 457* 439*   Basic Metabolic Panel: Recent Labs  Lab 11/16/17 1108 11/17/17 0428  NA 137 136  K 3.9 4.0  CL 103 101  CO2 23 20*  GLUCOSE 108* 99  BUN 10 10  CREATININE 1.17 0.92  CALCIUM 8.5* 8.5*   GFR: Estimated Creatinine Clearance: 111.4 mL/min (by C-G formula based on SCr of 0.92 mg/dL). Liver Function Tests: Recent Labs  Lab 11/16/17 1256  AST 20  ALT 33  ALKPHOS 138*  BILITOT 0.8  PROT 7.1  ALBUMIN 2.5*   No results for input(s): LIPASE, AMYLASE in the last 168 hours. No results for input(s): AMMONIA in the last 168 hours. Coagulation Profile:  No results for input(s): INR, PROTIME in the last 168 hours. Cardiac Enzymes: No results for input(s): CKTOTAL, CKMB, CKMBINDEX, TROPONINI in the last 168 hours. BNP (last 3 results) No results for input(s): PROBNP in the last 8760 hours. HbA1C: No results for input(s): HGBA1C in the last 72 hours. CBG: No results for input(s): GLUCAP in the last 168 hours. Lipid Profile: No results for input(s): CHOL, HDL, LDLCALC, TRIG, CHOLHDL, LDLDIRECT in the last 72 hours. Thyroid Function Tests: Recent Labs    11/16/17 1803  TSH 1.481   Anemia Panel: No results for input(s): VITAMINB12, FOLATE,  FERRITIN, TIBC, IRON, RETICCTPCT in the last 72 hours. Urine analysis: No results found for: COLORURINE, APPEARANCEUR, LABSPEC, PHURINE, GLUCOSEU, HGBUR, BILIRUBINUR, KETONESUR, PROTEINUR, UROBILINOGEN, NITRITE, LEUKOCYTESUR Recent Results (from the past 240 hour(s))  Culture, blood (routine x 2)     Status: None (Preliminary result)   Collection Time: 11/16/17  1:25 PM  Result Value Ref Range Status   Specimen Description BLOOD RIGHT ANTECUBITAL  Final   Special Requests   Final    BOTTLES DRAWN AEROBIC AND ANAEROBIC Blood Culture adequate volume   Culture NO GROWTH < 24 HOURS  Final   Report Status PENDING  Incomplete  Culture, blood (routine x 2)     Status: None (Preliminary result)   Collection Time: 11/16/17  3:09 PM  Result Value Ref Range Status   Specimen Description BLOOD LEFT ANTECUBITAL  Final   Special Requests   Final    BOTTLES DRAWN AEROBIC AND ANAEROBIC Blood Culture adequate volume   Culture NO GROWTH < 24 HOURS  Final   Report Status PENDING  Incomplete      Radiology Studies: Dg Chest 2 View  Result Date: 11/16/2017 CLINICAL DATA:  Chest pain and shortness of breath for several weeks EXAM: CHEST  2 VIEW COMPARISON:  None. FINDINGS: Cardiac shadow is mildly enlarged. Small right-sided pleural effusion is seen. A large left-sided pleural effusion is noted with likely underlying infiltrate. No pneumothorax is seen. No bony abnormality is noted. IMPRESSION: Large left pleural effusion with likely underlying infiltrate. Minimal blunting of the right costophrenic angle is noted as well. Electronically Signed   By: Alcide Clever M.D.   On: 11/16/2017 11:55   Ct Chest W Contrast  Result Date: 11/16/2017 CLINICAL DATA:  RIGHT upper quadrant RIGHT-side chest pain with shortness of breath for 2 weeks, severe chest pain when lying flat, unable to raise RIGHT arm above head, shortness of breath for 2 weeks, history hypertension EXAM: CT CHEST, ABDOMEN, AND PELVIS WITH CONTRAST  TECHNIQUE: Multidetector CT imaging of the chest, abdomen and pelvis was performed following the standard protocol during bolus administration of intravenous contrast. Sagittal and coronal MPR images reconstructed from axial data set. Patient was unable to raise his RIGHT arm above his head, resulting in beam hardening artifacts at the chest and abdomen. CONTRAST:  ISOVUE-300 IOPAMIDOL (ISOVUE-300) INJECTION 61% IV. No oral contrast administered. COMPARISON:  None FINDINGS: CT CHEST FINDINGS Cardiovascular: Thoracic vascular structures grossly patent on nondedicated exam. Small pericardial effusion. Mediastinum/Nodes: Normal sized RIGHT cardiophrenic angle lymph nodes. Enlarged azygo-esophageal recess node 16 mm short axis image 32. Minimally enlarged RIGHT paratracheal node 11 mm short axis image 26. No definite esophageal abnormalities. Base of cervical region unremarkable. Lungs/Pleura: Large LEFT pleural effusion with subtotal atelectasis of LEFT lower lobe and partial atelectasis of LEFT upper lobe in lingula. Small RIGHT pleural effusion with minimal compressive atelectasis of adjacent posterior RIGHT lower lobe base. Scattered  respiratory motion artifacts. No definite infiltrate or pneumothorax. Musculoskeletal: No acute osseous abnormalities. CT ABDOMEN PELVIS FINDINGS Hepatobiliary: Gallbladder and liver normal appearance Pancreas: Normal appearance Spleen: Normal appearance Adrenals/Urinary Tract: Adrenal glands, kidneys, ureters, and bladder normal appearance Stomach/Bowel: Normal appearing retrocecal appendix. Distal stomach incompletely distended. Stomach and bowel loops otherwise normal appearance for technique Vascular/Lymphatic: Vascular structures normal appearance. No adenopathy. Reproductive: Minimal prostatic enlargement. Other: No free air, free fluid, hernia, or inflammatory process Musculoskeletal: No acute osseous abnormalities. IMPRESSION: Large LEFT pleural effusion with significant  atelectasis of the LEFT lung. Small RIGHT pleural effusion with minimal atelectasis of the adjacent RIGHT lower lobe. Nonspecific mildly enlarged mediastinal lymph nodes. No acute intra-abdominal or intrapelvic abnormalities. No acute osseous abnormalities. Electronically Signed   By: Ulyses SouthwardMark  Boles M.D.   On: 11/16/2017 13:53   Ct Abdomen Pelvis W Contrast  Result Date: 11/16/2017 CLINICAL DATA:  RIGHT upper quadrant RIGHT-side chest pain with shortness of breath for 2 weeks, severe chest pain when lying flat, unable to raise RIGHT arm above head, shortness of breath for 2 weeks, history hypertension EXAM: CT CHEST, ABDOMEN, AND PELVIS WITH CONTRAST TECHNIQUE: Multidetector CT imaging of the chest, abdomen and pelvis was performed following the standard protocol during bolus administration of intravenous contrast. Sagittal and coronal MPR images reconstructed from axial data set. Patient was unable to raise his RIGHT arm above his head, resulting in beam hardening artifacts at the chest and abdomen. CONTRAST:  100mL ISOVUE-300 IOPAMIDOL (ISOVUE-300) INJECTION 61% IV. No oral contrast administered. COMPARISON:  None FINDINGS: CT CHEST FINDINGS Cardiovascular: Thoracic vascular structures grossly patent on nondedicated exam. Small pericardial effusion. Mediastinum/Nodes: Normal sized RIGHT cardiophrenic angle lymph nodes. Enlarged azygo-esophageal recess node 16 mm short axis image 32. Minimally enlarged RIGHT paratracheal node 11 mm short axis image 26. No definite esophageal abnormalities. Base of cervical region unremarkable. Lungs/Pleura: Large LEFT pleural effusion with subtotal atelectasis of LEFT lower lobe and partial atelectasis of LEFT upper lobe in lingula. Small RIGHT pleural effusion with minimal compressive atelectasis of adjacent posterior RIGHT lower lobe base. Scattered respiratory motion artifacts. No definite infiltrate or pneumothorax. Musculoskeletal: No acute osseous abnormalities. CT ABDOMEN  PELVIS FINDINGS Hepatobiliary: Gallbladder and liver normal appearance Pancreas: Normal appearance Spleen: Normal appearance Adrenals/Urinary Tract: Adrenal glands, kidneys, ureters, and bladder normal appearance Stomach/Bowel: Normal appearing retrocecal appendix. Distal stomach incompletely distended. Stomach and bowel loops otherwise normal appearance for technique Vascular/Lymphatic: Vascular structures normal appearance. No adenopathy. Reproductive: Minimal prostatic enlargement. Other: No free air, free fluid, hernia, or inflammatory process Musculoskeletal: No acute osseous abnormalities. IMPRESSION: Large LEFT pleural effusion with significant atelectasis of the LEFT lung. Small RIGHT pleural effusion with minimal atelectasis of the adjacent RIGHT lower lobe. Nonspecific mildly enlarged mediastinal lymph nodes. No acute intra-abdominal or intrapelvic abnormalities. No acute osseous abnormalities. Electronically Signed   By: Ulyses SouthwardMark  Boles M.D.   On: 11/16/2017 13:53    Scheduled Meds: . sodium chloride flush  3 mL Intravenous Q12H   Continuous Infusions: . sodium chloride    . levofloxacin (LEVAQUIN) IV 750 mg (11/17/17 1532)     LOS: 1 day   Time spent: 25 minutes.  [name] Geremiah Fussell, MD [facility] Pager [phone]  If 7PM-7AM, please contact night-coverage [email or web address] Password [email or web address] 11/17/2017, 3:39 PM

## 2017-11-17 NOTE — Plan of Care (Signed)
Progressing

## 2017-11-17 NOTE — Progress Notes (Signed)
Patient received from ED via bed. Vital signs are temp 98.1, pulse 119, BP 162/109, resp 20. Patient is alert and oriented. Skin assessment done with another nurse found intact. Patient given medicine for high BP per MD order. Patient is on telemetry. Patient given instruction about call bell, phone and unit routine.

## 2017-11-18 ENCOUNTER — Inpatient Hospital Stay (HOSPITAL_COMMUNITY): Payer: Self-pay

## 2017-11-18 ENCOUNTER — Encounter (HOSPITAL_COMMUNITY): Payer: Self-pay | Admitting: General Surgery

## 2017-11-18 DIAGNOSIS — R079 Chest pain, unspecified: Secondary | ICD-10-CM

## 2017-11-18 HISTORY — PX: IR THORACENTESIS ASP PLEURAL SPACE W/IMG GUIDE: IMG5380

## 2017-11-18 LAB — GRAM STAIN

## 2017-11-18 LAB — BODY FLUID CELL COUNT WITH DIFFERENTIAL
Lymphs, Fluid: 69 %
Monocyte-Macrophage-Serous Fluid: 21 % — ABNORMAL LOW (ref 50–90)
NEUTROPHIL FLUID: 10 % (ref 0–25)
Total Nucleated Cell Count, Fluid: 645 cu mm (ref 0–1000)

## 2017-11-18 LAB — CBC
HCT: 38.2 % — ABNORMAL LOW (ref 39.0–52.0)
Hemoglobin: 12.3 g/dL — ABNORMAL LOW (ref 13.0–17.0)
MCH: 25.1 pg — ABNORMAL LOW (ref 26.0–34.0)
MCHC: 32.2 g/dL (ref 30.0–36.0)
MCV: 78 fL (ref 78.0–100.0)
PLATELETS: 455 10*3/uL — AB (ref 150–400)
RBC: 4.9 MIL/uL (ref 4.22–5.81)
RDW: 13 % (ref 11.5–15.5)
WBC: 18.2 10*3/uL — AB (ref 4.0–10.5)

## 2017-11-18 LAB — ECHOCARDIOGRAM COMPLETE
Height: 68 in
Weight: 3231.06 oz

## 2017-11-18 LAB — PROTEIN, PLEURAL OR PERITONEAL FLUID: TOTAL PROTEIN, FLUID: 4.8 g/dL

## 2017-11-18 LAB — LACTATE DEHYDROGENASE, PLEURAL OR PERITONEAL FLUID: LD FL: 237 U/L — AB (ref 3–23)

## 2017-11-18 LAB — LACTATE DEHYDROGENASE: LDH: 141 U/L (ref 98–192)

## 2017-11-18 LAB — TROPONIN I: Troponin I: <0.03 ng/mL (ref <0.03)

## 2017-11-18 MED ORDER — MENTHOL 3 MG MT LOZG: 1.0000 | LOZENGE | OROMUCOSAL | Status: DC | PRN

## 2017-11-18 MED ORDER — IOPAMIDOL (ISOVUE-370) INJECTION 76%
INTRAVENOUS | Status: AC
  Administered 2017-11-18: 50 mL
  Filled 2017-11-18: qty 50

## 2017-11-18 MED ORDER — LIDOCAINE HCL (PF) 1 % IJ SOLN
INTRAMUSCULAR | Status: DC | PRN
  Administered 2017-11-18: 10 mL

## 2017-11-18 MED ORDER — LIDOCAINE 2% (20 MG/ML) 5 ML SYRINGE
INTRAMUSCULAR | Status: AC
  Filled 2017-11-18: qty 10

## 2017-11-18 NOTE — Progress Notes (Signed)
PROGRESS NOTE  Scott Rowe  ZOX:[id number] DOB: 24-Sep-1971 DOA: 11/16/2017 PCP: None Brief Narrative: Scott Rowe is a 47 y.o. male with a history of HTN not on medications and asthma who presented to the ED with dyspnea, chest pain and productive cough. CT chest demonstrated L > R pleural effusions with compressive atelectasis vs. infiltrate with nonspecific enlarged LNs. He was hypoxic, started on levaquin and admitted with work up ordered to rule out CHF.   Assessment & Plan: Principal Problem:   PNA (pneumonia) Active Problems:   Acute respiratory failure with hypoxia (HCC)   Hypertension   Pleural effusion  Acute hypoxic respiratory failure: due to pneumonia, atelectasis, pleural effusions. BNP wnl. Echo with EF 65-70%, normal wall motion and diastolic function. Normal RV. IVC normal caliber.  - Continue levaquin - Monitor blood and sputum cultures - Thoracentesis 1/23, will repeat CT chest with angio now that fluid is evacuated and to r/o PE.  HTN:  - Started metoprolol, BPs improved. - Continue IV hydralazine prn severe range HTN.   DVT prophylaxis: SCDs Code Status: Full Family Communication: None at bedside Disposition Plan: Uncertain, pending further work up.   Consultants:   None  Procedures:   Echocardiogram 11/18/2017: - Left ventricle: The cavity size was normal. Wall thickness was   increased in a pattern of mild LVH. Systolic function was   vigorous. The estimated ejection fraction was in the range of 65%   to 70%. Wall motion was normal; there were no regional wall   motion abnormalities. Left ventricular diastolic function   parameters were normal. - Pericardium, extracardiac: A trivial pericardial effusion was   identified posterior to the heart. There was no evidence of   hemodynamic compromise.  U/S-guided left thoracentesis 1.4L amber fluid 11/18/2017  Antimicrobials:  Levaquin 1/21 >>    Subjective: Breathing actually worse today. Chest pain is  , but still short of breath with minimal exertion which is new for him. Has been having cough, though less since admission. No current chest pain.   Objective: Vitals:   11/18/17 0827 11/18/17 0850 11/18/17 1100 11/18/17 1332  BP:  (!) 141/84 127/79 119/81  Pulse: (!) 119 (!) 121  (!) 106  Resp:  20  19  Temp:  98.4 F (36.9 C)  98.8 F (37.1 C)  TempSrc:  Oral  Oral  SpO2:  92%  96%  Weight:      Height:        Intake/Output Summary (Last 24 hours) at 11/18/2017 1638 Last data filed at 11/18/2017 1302 Gross per 24 hour  Intake 940 ml  Output 400 ml  Net 540 ml   Filed Weights   11/16/17 1110 11/16/17 2236  Weight: 91.6 kg (202 lb) 91.6 kg (201 lb 15.1 oz)    Gen: 47 y.o. male in no distress Pulm: Tachypneic, slightly labored on 2L. Diminished bilaterally CV: Regular tachycardia. No murmur, rub, or gallop. No JVD, no pedal edema. GI: Abdomen soft, non-tender, non-distended, with normoactive bowel sounds. No organomegaly or masses felt. Ext: Warm, no deformities Skin: No rashes, lesions no ulcers Neuro: Alert and oriented. No focal neurological deficits. Psych: Judgement and insight appear normal. Mood & affect appropriate.   Data Reviewed: I have personally reviewed following labs and imaging studies  CBC: Recent Labs  Lab 11/16/17 1108 11/17/17 0428 11/18/17 0247  WBC 12.8* 14.1* 18.2*  HGB 11.7* 11.8* 12.3*  HCT 36.0* 36.4* 38.2*  MCV 78.8 78.3 78.0  PLT 457* 439* 455*   Basic  Metabolic Panel: Recent Labs  Lab 11/16/17 1108 11/17/17 0428  NA 137 136  K 3.9 4.0  CL 103 101  CO2 23 20*  GLUCOSE 108* 99  BUN 10 10  CREATININE 1.17 0.92  CALCIUM 8.5* 8.5*   GFR: Estimated Creatinine Clearance: 111.4 mL/min (by C-G formula based on SCr of 0.92 mg/dL). Liver Function Tests: Recent Labs  Lab 11/16/17 1256  AST 20  ALT 33  ALKPHOS 138*  BILITOT 0.8  PROT 7.1  ALBUMIN 2.5*   No results for input(s): LIPASE, AMYLASE in the last 168 hours. No  results for input(s): AMMONIA in the last 168 hours. Coagulation Profile: No results for input(s): INR, PROTIME in the last 168 hours. Cardiac Enzymes: No results for input(s): CKTOTAL, CKMB, CKMBINDEX, TROPONINI in the last 168 hours. BNP (last 3 results) No results for input(s): PROBNP in the last 8760 hours. HbA1C: No results for input(s): HGBA1C in the last 72 hours. CBG: No results for input(s): GLUCAP in the last 168 hours. Lipid Profile: No results for input(s): CHOL, HDL, LDLCALC, TRIG, CHOLHDL, LDLDIRECT in the last 72 hours. Thyroid Function Tests: Recent Labs    11/16/17 1803  TSH 1.481   Anemia Panel: No results for input(s): VITAMINB12, FOLATE, FERRITIN, TIBC, IRON, RETICCTPCT in the last 72 hours. Urine analysis: No results found for: COLORURINE, APPEARANCEUR, LABSPEC, PHURINE, GLUCOSEU, HGBUR, BILIRUBINUR, KETONESUR, PROTEINUR, UROBILINOGEN, NITRITE, LEUKOCYTESUR Recent Results (from the past 240 hour(s))  Culture, blood (routine x 2)     Status: None (Preliminary result)   Collection Time: 11/16/17  1:25 PM  Result Value Ref Range Status   Specimen Description BLOOD RIGHT ANTECUBITAL  Final   Special Requests   Final    BOTTLES DRAWN AEROBIC AND ANAEROBIC Blood Culture adequate volume   Culture NO GROWTH 2 DAYS  Final   Report Status PENDING  Incomplete  Culture, blood (routine x 2)     Status: None (Preliminary result)   Collection Time: 11/16/17  3:09 PM  Result Value Ref Range Status   Specimen Description BLOOD LEFT ANTECUBITAL  Final   Special Requests   Final    BOTTLES DRAWN AEROBIC AND ANAEROBIC Blood Culture adequate volume   Culture NO GROWTH 2 DAYS  Final   Report Status PENDING  Incomplete  Gram stain     Status: None   Collection Time: 11/18/17 12:48 PM  Result Value Ref Range Status   Specimen Description PLEURAL LEFT  Final   Special Requests NONE  Final   Gram Stain   Final    FEW WBC PRESENT, PREDOMINANTLY MONONUCLEAR NO ORGANISMS SEEN     Report Status 11/18/2017 FINAL  Final      Radiology Studies: Dg Chest 1 View  Result Date: 11/18/2017 CLINICAL DATA:  Post left-sided thoracentesis. History of hypertension. EXAM: CHEST 1 VIEW COMPARISON:  11/16/2017; chest CT-11/16/2010 FINDINGS: Interval reduction/near resolution of residual trace left-sided effusion post thoracentesis. No pneumothorax. Unchanged trace right-sided pleural effusion. Improved aeration of the left lower lung with persistent left mid and basilar heterogeneous/consolidative opacities. Pulmonary vasculature remains indistinct with cephalization of flow. No acute osseus abnormalities. IMPRESSION: 1. Interval reduction/near resolution of left-sided effusion post thoracentesis. No pneumothorax. 2. Improved aeration of left lower lung with similar findings of suspected pulmonary edema and bibasilar atelectasis. Electronically Signed   By: Simonne Come M.D.   On: 11/18/2017 12:58   Ir Thoracentesis Asp Pleural Space W/img Guide  Result Date: 11/18/2017 INDICATION: Pneumonia with large left pleural effusion. Request is  made for diagnostic and therapeutic thoracentesis. EXAM: ULTRASOUND GUIDED DIAGNOSTIC AND THERAPEUTIC THORACENTESIS MEDICATIONS: 2% lidocaine COMPLICATIONS: None immediate. PROCEDURE: An ultrasound guided thoracentesis was thoroughly discussed with the patient and questions answered. The benefits, risks, alternatives and complications were also discussed. The patient understands and wishes to proceed with the procedure. Written consent was obtained. Ultrasound was performed to localize and mark an adequate pocket of fluid in the left chest. The area was then prepped and draped in the normal sterile fashion. 2% lidocaine was used for local anesthesia. Under ultrasound guidance a Safe-T-Centesis catheter was introduced. Thoracentesis was performed. The catheter was removed and a dressing applied. FINDINGS: A total of approximately 1.4 L of amber fluid was removed.  Samples were sent to the laboratory as requested by the clinical team. IMPRESSION: Successful ultrasound guided left thoracentesis yielding 1.4 L of pleural fluid. Read by: Barnetta ChapelKelly Osborne, PA-C Electronically Signed   By: Malachy MoanHeath  McCullough M.D.   On: 11/18/2017 14:05    Scheduled Meds: . levofloxacin  500 mg Oral Q1500  . lidocaine      . metoprolol tartrate  25 mg Oral BID  . sodium chloride flush  3 mL Intravenous Q12H   Continuous Infusions: . sodium chloride       LOS: 2 days   Time spent: 25 minutes.  [name] Grunz, MD [facility] Pager [phone]  If 7PM-7AM, please contact night-coverage [email or web address] Password [email or web address] 11/18/2017, 4:37 PM

## 2017-11-18 NOTE — Procedures (Addendum)
Ultrasound-guided diagnostic and therapeutic left thoracentesis performed yielding 1.4 liters of amber colored fluid. No immediate complications. Follow-up chest x-ray pending. Procedure terminated slightly early secondary to excessive coughing.     Letha CapeKelly E Kishawn Pickar 12:37 PM 11/18/2017

## 2017-11-18 NOTE — Progress Notes (Signed)
  Echocardiogram 2D Echocardiogram has been performed.  Roosvelt MaserLane, Hilmer Aliberti F 11/18/2017, 3:15 PM

## 2017-11-19 DIAGNOSIS — J90 Pleural effusion, not elsewhere classified: Secondary | ICD-10-CM

## 2017-11-19 DIAGNOSIS — J189 Pneumonia, unspecified organism: Secondary | ICD-10-CM

## 2017-11-19 LAB — BASIC METABOLIC PANEL
ANION GAP: 10 (ref 5–15)
BUN: 14 mg/dL (ref 6–20)
CHLORIDE: 103 mmol/L (ref 101–111)
CO2: 23 mmol/L (ref 22–32)
CREATININE: 0.99 mg/dL (ref 0.61–1.24)
Calcium: 8.3 mg/dL — ABNORMAL LOW (ref 8.9–10.3)
GFR calc non Af Amer: >60 mL/min (ref >60)
GLUCOSE: 101 mg/dL — AB (ref 65–99)
Potassium: 3.9 mmol/L (ref 3.5–5.1)
Sodium: 136 mmol/L (ref 135–145)

## 2017-11-19 LAB — CBC
HEMATOCRIT: 35.7 % — AB (ref 39.0–52.0)
HEMOGLOBIN: 11.3 g/dL — AB (ref 13.0–17.0)
MCH: 24.8 pg — ABNORMAL LOW (ref 26.0–34.0)
MCHC: 31.7 g/dL (ref 30.0–36.0)
MCV: 78.3 fL (ref 78.0–100.0)
Platelets: 413 10*3/uL — ABNORMAL HIGH (ref 150–400)
RBC: 4.56 MIL/uL (ref 4.22–5.81)
RDW: 13.6 % (ref 11.5–15.5)
WBC: 15.1 10*3/uL — ABNORMAL HIGH (ref 4.0–10.5)

## 2017-11-19 LAB — SEDIMENTATION RATE: Sed Rate: 70 mm/hr — ABNORMAL HIGH (ref 0–16)

## 2017-11-19 MED ORDER — METOPROLOL TARTRATE 50 MG PO TABS
50.0000 mg | ORAL_TABLET | Freq: Two times a day (BID) | ORAL | Status: DC
  Administered 2017-11-19 – 2017-11-21 (×4): 50 mg via ORAL
  Filled 2017-11-19 (×5): qty 1

## 2017-11-19 MED ORDER — METOPROLOL TARTRATE 50 MG PO TABS
50.0000 mg | ORAL_TABLET | Freq: Two times a day (BID) | ORAL | Status: DC
Start: 2017-11-19 — End: 2017-11-19

## 2017-11-19 NOTE — Progress Notes (Signed)
Patient arrived from 5W to 4E room 16.  Telemetry monitor applied and CCMD notified.  Patient oriented to unit and room to include call light and phone.  Will continue to monitor.

## 2017-11-19 NOTE — Progress Notes (Signed)
SATURATION QUALIFICATIONS: (This note is used to comply with regulatory documentation for home oxygen)  Patient Saturations on Room Air at Rest = 100%  Patient Saturations on Room Air while Ambulating = 99%  Patient Saturations on 0 Liters of oxygen while Ambulating =  Please briefly explain why patient needs home oxygen: O2 not required.

## 2017-11-19 NOTE — Care Management Note (Signed)
Case Management Note  Patient Details  Name: Donnie Mesabdul Kowalke MRN: [id number] Date of Birth: 10/26/1971  Subjective/Objective:       Admitted with PNA/bilateral pleural effusions. From home alone. Independent with ADL's PTA, no DME usage. Pt without insurance/ no PCP. Pt will probably benefit from Match Letter and a [facility] clinic appointment @ d/c, CM to f/u in obtaining for pt.          Action/Plan: Transition to home when medically stable. CM will continue to follow for transitional care needs  Expected Discharge Date:                  Expected Discharge Plan:  Home/Self Care  In-House Referral:     Discharge planning Services  CM Consult  Post Acute Care Choice:    Choice offered to:     DME Arranged:    DME Agency:     HH Arranged:    HH Agency:     Status of Service:  In process, will continue to follow  If discussed at Long Length of Stay Meetings, dates discussed:    Additional Comments:  Epifanio LeschesCole, Jennah Satchell Hudson, RN 11/19/2017, 2:43 PM

## 2017-11-19 NOTE — Progress Notes (Signed)
Order for airborne precautions on pt. There is not a negative pressure room on this unit. Secretary requested transfer to an airborne room. Airborne sign placed on pt's room door and door kept closed as much as possible. Will continue to assess.

## 2017-11-19 NOTE — Progress Notes (Signed)
Pt's HR rises into the 150's when he is up and ambulating. MD Jarvis NewcomerGrunz made aware. Metoprolol dose changed. Will continue to assess.

## 2017-11-19 NOTE — Progress Notes (Addendum)
PROGRESS NOTE  Scott Rowe  ZOX:[id number] DOB: [birth year] DOA: 11/16/2017 PCP: None Brief Narrative: Scott Rowe is a 47 y.o. male with a history of HTN not on medications and asthma who presented to the ED with dyspnea, chest pain and productive cough. CT chest demonstrated L > R pleural effusions with compressive atelectasis vs. infiltrate with nonspecific enlarged LNs. He was hypoxic, started on levaquin and supplemental oxygen. Left thoracentesis of 1.4L on 1/23 showed exudate and repeat CTA ruled out PE. No definite mass noted. Cultures have remained negative to date.   Assessment & Plan: Principal Problem:   PNA (pneumonia) Active Problems:   Acute respiratory failure with hypoxia (HCC)   Hypertension   Pleural effusion  Acute hypoxic respiratory failure: due to pneumonia, atelectasis, pleural effusions. BNP wnl. Echo with EF 65-70%, normal wall motion and diastolic function. Normal RV. IVC normal caliber.  - Wean oxygen as able today, pt still very symptomatically short of breath.  - Continue levaquin - Monitor blood and sputum cultures - Thoracentesis 1/23, will follow up cytology. D/w pulm, Dr. Molli Knock, who recommends bronch if cytology negative.   Atrial flutter: Noted after initial filing of progress note. Discussed with Dr. Sharyn Lull who will consult on the patient. Echocardiogram shows normal atria. TSH 1.481.  - Suspect with low CHADSVASc, could get by with heparin  - Pt was started on metoprolol during this hospitalization. Rate control agents per cardiology.   HTN:  - Started metoprolol, will increase given ongoing elevated BPs.  - Continue IV hydralazine prn severe range HTN.   DVT prophylaxis: SCDs Code Status: Full Family Communication: None at bedside Disposition Plan: Uncertain, pending further work up.   Consultants:   Pulmonology, Dr. Molli Knock, by phone ONLY on 1/24.   Procedures:   Echocardiogram 11/18/2017: - Left ventricle: The cavity size was normal. Wall  thickness was   increased in a pattern of mild LVH. Systolic function was   vigorous. The estimated ejection fraction was in the range of 65%   to 70%. Wall motion was normal; there were no regional wall   motion abnormalities. Left ventricular diastolic function   parameters were normal. - Pericardium, extracardiac: A trivial pericardial effusion was   identified posterior to the heart. There was no evidence of   hemodynamic compromise.  U/S-guided left thoracentesis 1.4L amber fluid 11/18/2017  Antimicrobials:  Levaquin 1/21 >>    Subjective: Still very short of breath with upper left and right chest pain worse with laying on those sides. Dyspnea but not chest pain is worse with exertion.   Objective: Vitals:   11/19/17 0549 11/19/17 0653 11/19/17 0906 11/19/17 1114  BP: (!) 152/101 (!) 142/89 (!) 141/102   Pulse: (!) 109 (!) 108 (!) 116   Resp: 18     Temp: 98.6 F (37 C)     TempSrc: Oral     SpO2: 98%   99%  Weight:      Height:        Intake/Output Summary (Last 24 hours) at 11/19/2017 1253 Last data filed at 11/19/2017 1108 Gross per 24 hour  Intake 460 ml  Output 600 ml  Net -140 ml   Filed Weights   11/16/17 1110 11/16/17 2236  Weight: 91.6 kg (202 lb) 91.6 kg (201 lb 15.1 oz)    Gen: 47 y.o. male in no distress Pulm: Tachypneic, decreased at bases.  CV: Regular tachycardia. No murmur, rub, or gallop. No JVD, no pedal edema. GI: Abdomen soft, non-tender, non-distended, with normoactive  bowel sounds. No organomegaly or masses felt. Ext: Warm, no deformities Skin: No rashes, lesions no ulcers Neuro: Alert and oriented. No focal neurological deficits. Psych: Judgement and insight appear normal. Mood & affect appropriate.   Data Reviewed: I have personally reviewed following labs and imaging studies  CBC: Recent Labs  Lab 11/16/17 1108 11/17/17 0428 11/18/17 0247 11/19/17 0503  WBC 12.8* 14.1* 18.2* 15.1*  HGB 11.7* 11.8* 12.3* 11.3*  HCT 36.0* 36.4*  38.2* 35.7*  MCV 78.8 78.3 78.0 78.3  PLT 457* 439* 455* 413*   Basic Metabolic Panel: Recent Labs  Lab 11/16/17 1108 11/17/17 0428 11/19/17 0503  NA 137 136 136  K 3.9 4.0 3.9  CL 103 101 103  CO2 23 20* 23  GLUCOSE 108* 99 101*  BUN 10 10 14   CREATININE 1.17 0.92 0.99  CALCIUM 8.5* 8.5* 8.3*   GFR: Estimated Creatinine Clearance: 103.6 mL/min (by C-G formula based on SCr of 0.99 mg/dL). Liver Function Tests: Recent Labs  Lab 11/16/17 1256  AST 20  ALT 33  ALKPHOS 138*  BILITOT 0.8  PROT 7.1  ALBUMIN 2.5*   No results for input(s): LIPASE, AMYLASE in the last 168 hours. No results for input(s): AMMONIA in the last 168 hours. Coagulation Profile: No results for input(s): INR, PROTIME in the last 168 hours. Cardiac Enzymes: Recent Labs  Lab 11/18/17 1409  TROPONINI <0.03   BNP (last 3 results) No results for input(s): PROBNP in the last 8760 hours. HbA1C: No results for input(s): HGBA1C in the last 72 hours. CBG: No results for input(s): GLUCAP in the last 168 hours. Lipid Profile: No results for input(s): CHOL, HDL, LDLCALC, TRIG, CHOLHDL, LDLDIRECT in the last 72 hours. Thyroid Function Tests: Recent Labs    11/16/17 1803  TSH 1.481   Anemia Panel: No results for input(s): VITAMINB12, FOLATE, FERRITIN, TIBC, IRON, RETICCTPCT in the last 72 hours. Urine analysis: No results found for: COLORURINE, APPEARANCEUR, LABSPEC, PHURINE, GLUCOSEU, HGBUR, BILIRUBINUR, KETONESUR, PROTEINUR, UROBILINOGEN, NITRITE, LEUKOCYTESUR Recent Results (from the past 240 hour(s))  Culture, blood (routine x 2)     Status: None (Preliminary result)   Collection Time: 11/16/17  1:25 PM  Result Value Ref Range Status   Specimen Description BLOOD RIGHT ANTECUBITAL  Final   Special Requests   Final    BOTTLES DRAWN AEROBIC AND ANAEROBIC Blood Culture adequate volume   Culture NO GROWTH 3 DAYS  Final   Report Status PENDING  Incomplete  Culture, blood (routine x 2)     Status:  None (Preliminary result)   Collection Time: 11/16/17  3:09 PM  Result Value Ref Range Status   Specimen Description BLOOD LEFT ANTECUBITAL  Final   Special Requests   Final    BOTTLES DRAWN AEROBIC AND ANAEROBIC Blood Culture adequate volume   Culture NO GROWTH 3 DAYS  Final   Report Status PENDING  Incomplete  Gram stain     Status: None   Collection Time: 11/18/17 12:48 PM  Result Value Ref Range Status   Specimen Description PLEURAL LEFT  Final   Special Requests NONE  Final   Gram Stain   Final    FEW WBC PRESENT, PREDOMINANTLY MONONUCLEAR NO ORGANISMS SEEN    Report Status 11/18/2017 FINAL  Final  Culture, body fluid-bottle     Status: None (Preliminary result)   Collection Time: 11/18/17 12:48 PM  Result Value Ref Range Status   Specimen Description PLEURAL LEFT  Final   Special Requests NONE  Final  Culture NO GROWTH < 24 HOURS  Final   Report Status PENDING  Incomplete      Radiology Studies: Dg Chest 1 View  Result Date: 11/18/2017 CLINICAL DATA:  Post left-sided thoracentesis. History of hypertension. EXAM: CHEST 1 VIEW COMPARISON:  11/16/2017; chest CT-11/16/2010 FINDINGS: Interval reduction/near resolution of residual trace left-sided effusion post thoracentesis. No pneumothorax. Unchanged trace right-sided pleural effusion. Improved aeration of the left lower lung with persistent left mid and basilar heterogeneous/consolidative opacities. Pulmonary vasculature remains indistinct with cephalization of flow. No acute osseus abnormalities. IMPRESSION: 1. Interval reduction/near resolution of left-sided effusion post thoracentesis. No pneumothorax. 2. Improved aeration of left lower lung with similar findings of suspected pulmonary edema and bibasilar atelectasis. Electronically Signed   By: Simonne ComeJohn  Watts M.D.   On: 11/18/2017 12:58   Ct Angio Chest Pe W Or Wo Contrast  Result Date: 11/18/2017 CLINICAL DATA:  Shortness of breath.  Recent thoracentesis. EXAM: CT ANGIOGRAPHY  CHEST WITH CONTRAST TECHNIQUE: Multidetector CT imaging of the chest was performed using the standard protocol during bolus administration of intravenous contrast. Multiplanar CT image reconstructions and MIPs were obtained to evaluate the vascular anatomy. CONTRAST:  50mL ISOVUE-370 IOPAMIDOL (ISOVUE-370) INJECTION 76% COMPARISON:  Chest radiograph 11/18/2017 FINDINGS: Cardiovascular: Contrast injection is sufficient to demonstrate satisfactory opacification of the pulmonary arteries to the segmental level. There is no pulmonary embolus. The main pulmonary artery is within normal limits for size. There is a normal 3-vessel arch branching pattern without evidence of acute aortic syndrome. There is noaortic atherosclerosis. Heart size is enlarged. Small pericardial effusion is unchanged. Mediastinum/Nodes: No mediastinal, hilar or axillary lymphadenopathy. The visualized thyroid and thoracic esophageal course are unremarkable. Lungs/Pleura: Ground-glass opacities throughout the lingula and left lower lobe with markedly decreased size of pleural effusion, which is now small. Unchanged medium-sized right pleural effusion. Mild right basilar atelectasis. No pneumothorax. Upper Abdomen: Contrast bolus timing is not optimized for evaluation of the abdominal organs. Within this limitation, the visualized organs of the upper abdomen are normal. Musculoskeletal: No chest wall abnormality. No acute or significant osseous findings. Review of the MIP images confirms the above findings. IMPRESSION: 1. No pulmonary embolus. 2. Greatly decreased size of left pleural effusion following thoracentesis with multifocal ground-glass opacity throughout the lingula and left lower lobe likely indicating re-expansion pulmonary edema. 3. No pneumothorax. 4. Small pericardial effusion, unchanged. Electronically Signed   By: Deatra RobinsonKevin  Herman M.D.   On: 11/18/2017 18:48   Ir Thoracentesis Asp Pleural Space W/img Guide  Result Date:  11/18/2017 INDICATION: Pneumonia with large left pleural effusion. Request is made for diagnostic and therapeutic thoracentesis. EXAM: ULTRASOUND GUIDED DIAGNOSTIC AND THERAPEUTIC THORACENTESIS MEDICATIONS: 2% lidocaine COMPLICATIONS: None immediate. PROCEDURE: An ultrasound guided thoracentesis was thoroughly discussed with the patient and questions answered. The benefits, risks, alternatives and complications were also discussed. The patient understands and wishes to proceed with the procedure. Written consent was obtained. Ultrasound was performed to localize and mark an adequate pocket of fluid in the left chest. The area was then prepped and draped in the normal sterile fashion. 2% lidocaine was used for local anesthesia. Under ultrasound guidance a Safe-T-Centesis catheter was introduced. Thoracentesis was performed. The catheter was removed and a dressing applied. FINDINGS: A total of approximately 1.4 L of amber fluid was removed. Samples were sent to the laboratory as requested by the clinical team. IMPRESSION: Successful ultrasound guided left thoracentesis yielding 1.4 L of pleural fluid. Read by: Barnetta ChapelKelly Osborne, PA-C Electronically Signed   By: Malachy MoanHeath  McCullough  M.D.   On: 11/18/2017 14:05    Scheduled Meds: . levofloxacin  500 mg Oral Q1500  . metoprolol tartrate  25 mg Oral BID  . sodium chloride flush  3 mL Intravenous Q12H   Continuous Infusions: . sodium chloride       LOS: 3 days   Time spent: 25 minutes.  Hazeline Junker, MD [facility] Pager [phone]  If 7PM-7AM, please contact night-coverage [email or web address] Password [facility] 11/19/2017, 12:53 PM

## 2017-11-19 NOTE — Consult Note (Signed)
Name: Scott Rowe MRN: [id number] DOB: 05-05-1971    ADMISSION DATE:  11/16/2017 CONSULTATION DATE:  11/19/17  REFERRING MD :  Dr. Bonner Puna / [facility]   CHIEF COMPLAINT:  Chest pain, SOB x3 weeks   HISTORY OF PRESENT ILLNESS: 47 y/o M admitted 1/21 with complaints of chest pain and shortness of breath x 4 weeks.    He reports he began having progressive shortness of breath that started about 4 weeks ago.  He was unable to lie flat or stand up straight.  He had to lean over for comfort.  He states he later developed chest pain that went through into his back and also had an associated dry cough.  He began using [employer] which helped with the pain but stated he "googled" it and read it can be bad for your kidneys.  He denied known fevers, chills, n/v.  He states he never has smoked.  Is originally from [place].  Has never had a PPD.  He denies falls / chest injury.  He denies night sweats, weight loss, hemoptysis, TB exposures (known).    ER evaluation revealed a CXR which demonstrated a large left pleural effusion.  The patient was admitted for evaluation of pleural effusion.  He was treated empirically for PNA with levaquin. HIV assessed and negative. ECHO 1/23 showed mild LVH, LVEF 65-70%, normal wall motion, trivial pericardial effusion.  The patient underwent left thoracentesis on 1/24 with 1.4L of amber pleural fluid removed.  Post thora, he was taken for a CTA of the chest which showed significant reduction of effusion, no mass, mild non-specific mediastinal lymphadenopathy and GGO on the left thought to be mild re-expansion pulmonary edema.     PCCM consulted for evaluation.   PAST MEDICAL HISTORY :   has a past medical history of Hypertension.   has a past surgical history that includes IR THORACENTESIS ASP PLEURAL SPACE W/IMG GUIDE (11/18/2017).  Prior to Admission medications   Medication Sig Start Date End Date Taking? Authorizing Provider  acetaminophen (TYLENOL) 500 MG tablet Take  500 mg by mouth every 6 (six) hours as needed for mild pain.   Yes [provider]  Aspirin-Acetaminophen-Caffeine (GOODY HEADACHE PO) Take 1 packet by mouth every 6 (six) hours as needed (pain).   Yes [provider]  vitamin C (ASCORBIC ACID) 500 MG tablet Take 500 mg by mouth daily.   Yes [provider]    No Known Allergies  FAMILY HISTORY:  family history is not on file.  SOCIAL HISTORY:  reports that  has never smoked. he has never used smokeless tobacco. He reports that he does not drink alcohol or use drugs.  REVIEW OF SYSTEMS:  POSITIVES IN BOLD Constitutional: Negative for fever, chills, weight loss, malaise/fatigue and diaphoresis.  HENT: Negative for hearing loss, ear pain, nosebleeds, congestion, sore throat, neck pain, tinnitus and ear discharge.   Eyes: Negative for blurred vision, double vision, photophobia, pain, discharge and redness.  Respiratory: Negative for cough, hemoptysis, sputum production, shortness of breath, wheezing and stridor.   Cardiovascular: Negative for chest pain, palpitations, orthopnea, claudication, leg swelling and PND.  Gastrointestinal: Negative for heartburn, nausea, vomiting, abdominal pain, diarrhea, constipation, blood in stool and melena.  Genitourinary: Negative for dysuria, urgency, frequency, hematuria and flank pain.  Musculoskeletal: Negative for myalgias, back pain, joint pain and falls.  Skin: Negative for itching and rash.  Neurological: Negative for dizziness, tingling, tremors, sensory change, speech change, focal weakness, seizures, loss of consciousness, weakness and  headaches.  Endo/Heme/Allergies: Negative for environmental allergies and polydipsia. Does not bruise/bleed easily.  SUBJECTIVE:   VITAL SIGNS: Temp:  [98.6 F (37 C)-99.6 F (37.6 C)] 98.6 F (37 C) (01/24 0549) Pulse Rate:  [106-116] 116 (01/24 0906) Resp:  [18-19] 18 (01/24 0549) BP: (119-152)/(81-102) 141/102 (01/24 0906) SpO2:   [96 %-99 %] 99 % (01/24 1114)  PHYSICAL EXAMINATION: General: young adult male in NAD, lying in bed HEENT: MM pink/moist, no jvd PSY: calm /pleasant  Neuro: AAOx4, speech clear  CV: s1s2 rrr, no m/r/g PULM: even/non-labored, lungs bilaterally clear anterior, diminished lower posterior  CH:ENID, non-tender, bsx4 active  Extremities: warm/dry, no edema  Skin: no rashes or lesions  Recent Labs  Lab 11/16/17 1108 11/17/17 0428 11/19/17 0503  NA 137 136 136  K 3.9 4.0 3.9  CL 103 101 103  CO2 23 20* 23  BUN 10 10 14   CREATININE 1.17 0.92 0.99  GLUCOSE 108* 99 101*    Recent Labs  Lab 11/17/17 0428 11/18/17 0247 11/19/17 0503  HGB 11.8* 12.3* 11.3*  HCT 36.4* 38.2* 35.7*  WBC 14.1* 18.2* 15.1*  PLT 439* 455* 413*    Dg Chest 1 View  Result Date: 11/18/2017 CLINICAL DATA:  Post left-sided thoracentesis. History of hypertension. EXAM: CHEST 1 VIEW COMPARISON:  11/16/2017; chest CT-11/16/2010 FINDINGS: Interval reduction/near resolution of residual trace left-sided effusion post thoracentesis. No pneumothorax. Unchanged trace right-sided pleural effusion. Improved aeration of the left lower lung with persistent left mid and basilar heterogeneous/consolidative opacities. Pulmonary vasculature remains indistinct with cephalization of flow. No acute osseus abnormalities. IMPRESSION: 1. Interval reduction/near resolution of left-sided effusion post thoracentesis. No pneumothorax. 2. Improved aeration of left lower lung with similar findings of suspected pulmonary edema and bibasilar atelectasis. Electronically Signed   By: Sandi Mariscal M.D.   On: 11/18/2017 12:58   Ct Angio Chest Pe W Or Wo Contrast  Result Date: 11/18/2017 CLINICAL DATA:  Shortness of breath.  Recent thoracentesis. EXAM: CT ANGIOGRAPHY CHEST WITH CONTRAST TECHNIQUE: Multidetector CT imaging of the chest was performed using the standard protocol during bolus administration of intravenous contrast. Multiplanar CT image  reconstructions and MIPs were obtained to evaluate the vascular anatomy. CONTRAST:  23m ISOVUE-370 IOPAMIDOL (ISOVUE-370) INJECTION 76% COMPARISON:  Chest radiograph 11/18/2017 FINDINGS: Cardiovascular: Contrast injection is sufficient to demonstrate satisfactory opacification of the pulmonary arteries to the segmental level. There is no pulmonary embolus. The main pulmonary artery is within normal limits for size. There is a normal 3-vessel arch branching pattern without evidence of acute aortic syndrome. There is noaortic atherosclerosis. Heart size is enlarged. Small pericardial effusion is unchanged. Mediastinum/Nodes: No mediastinal, hilar or axillary lymphadenopathy. The visualized thyroid and thoracic esophageal course are unremarkable. Lungs/Pleura: Ground-glass opacities throughout the lingula and left lower lobe with markedly decreased size of pleural effusion, which is now small. Unchanged medium-sized right pleural effusion. Mild right basilar atelectasis. No pneumothorax. Upper Abdomen: Contrast bolus timing is not optimized for evaluation of the abdominal organs. Within this limitation, the visualized organs of the upper abdomen are normal. Musculoskeletal: No chest wall abnormality. No acute or significant osseous findings. Review of the MIP images confirms the above findings. IMPRESSION: 1. No pulmonary embolus. 2. Greatly decreased size of left pleural effusion following thoracentesis with multifocal ground-glass opacity throughout the lingula and left lower lobe likely indicating re-expansion pulmonary edema. 3. No pneumothorax. 4. Small pericardial effusion, unchanged. Electronically Signed   By: KUlyses JarredM.D.   On: 11/18/2017 18:48   Ir Thoracentesis  Asp Pleural Space W/img Guide  Result Date: 11/18/2017 INDICATION: Pneumonia with large left pleural effusion. Request is made for diagnostic and therapeutic thoracentesis. EXAM: ULTRASOUND GUIDED DIAGNOSTIC AND THERAPEUTIC THORACENTESIS  MEDICATIONS: 2% lidocaine COMPLICATIONS: None immediate. PROCEDURE: An ultrasound guided thoracentesis was thoroughly discussed with the patient and questions answered. The benefits, risks, alternatives and complications were also discussed. The patient understands and wishes to proceed with the procedure. Written consent was obtained. Ultrasound was performed to localize and mark an adequate pocket of fluid in the left chest. The area was then prepped and draped in the normal sterile fashion. 2% lidocaine was used for local anesthesia. Under ultrasound guidance a Safe-T-Centesis catheter was introduced. Thoracentesis was performed. The catheter was removed and a dressing applied. FINDINGS: A total of approximately 1.4 L of amber fluid was removed. Samples were sent to the laboratory as requested by the clinical team. IMPRESSION: Successful ultrasound guided left thoracentesis yielding 1.4 L of pleural fluid. Read by: Saverio Danker, PA-C Electronically Signed   By: Jacqulynn Cadet M.D.   On: 11/18/2017 14:05    SIGNIFICANT EVENTS  1/21  Admit  1/23  Thora with 1.4L removed  1/24  PCCM consulted  STUDIES 1/21  CT Chest/ABD/Pelvis >> large left pleural effusion with significant atelectasis of the left lung, small right pleural effusion with minimal atelectasis, mildly enlarged mediastinal lymph nodes, no acute abdominal process.  1/23  CTA Chest >> negative for PE, decreased effusion on left, multifocal ground-glass opacity throughout the lingula, LLL likely indicating re-expansion pulmonary edema, no PTX, small pericardial effusion 1/23 Pleural Fluid >> LDH 237, T.Protein 4.8, WBC 645  ANTIBIOTICS Levaquin 1/21 >>   CULTURES  BCx2 1/21 >>  Pleural GS 1/23 >> Pleural Culture 1/23 >>   ASSESSMENT / PLAN:  Discussion: 47 y/o M admitted with cough & SOB.  Found to have a large left pleural effusion s/p thora 1/23 with 1.4L amber fluid removed.  DDx included parapneumonic process / empyema,  autoimmune process, TB, and malignancy.  ECHO as above.  Mild, non-specific LAN on CT, ? Re-expansion pulmonary edema.  Left > Right Exudative Pleural Effusion - exudatives/p thora with 1.4L removed 1/23  Possible PNA   Plan: Await pleural studies  Follow CXR  Continue empiric antibiotics for possible PNA Treat pleuritic pain > PRN norco Assess pleural fluid for adenosine deaminase  Assess ESR, RF, ANA, ANCA Airborne precautions  Assess quantiferon gold   Small Pericardial Effusion - no evidence of tamponade physiology   Plan: Monitor, no ST elevation on EKG   HTN   Plan: Per primary MD  Noe Gens, NP-C [facility] Pgr: [phone] or if no answer [phone] 11/19/2017, 11:58 AM  Attending Note:  47 year old male from [place] who presents with pleural effusion s/p thora that is exudative with mostly lymphs.  On exam, crackles on the left.  I reviewed chest CT myself, infiltrate noted on the left.  Discussed with PCCM-NP.  Pleural effusion:  DDx of cancer vs TB vs auto-immune  - ESR  - CRP  - CXR as needed  - ANA  - ADA  - F/U on cytology  Hypoxemia:  - Titrate O2 for sat of 88-92%  Pulmonary infiltrate:  - Auto-immune work up  - F/U imaging  PCCM will continue to follow  Patient seen and examined, agree with above note.  I dictated the care and orders written for this patient under my direction.  Rush Farmer, M.D. [facility]Critical  Care Medicine. Pager: [phone]. After hours pager: [phone].

## 2017-11-19 NOTE — Progress Notes (Signed)
Report called to Candise BowensJen, RN on 4E. All questions answered. Pt transferred to 4E16.

## 2017-11-19 NOTE — Consult Note (Signed)
Reason for Consult: Paroxysmal A. fib flutter with RVR Referring Physician: [facility] hospitalist  Scott Rowe is an 47 y.o. male.  HPI: Patient is 47 year old [place] male with past medical history significant for hypertension was admitted on 11/16/2017 because of progressive increasing shortness of breath associated with pleuritic chest pain for last 3-4 weeks and was noted to have large left pleural effusion subsequently required thoracentesis with improvement in his symptoms patient today had episode of A. fib/flutter with rapid ventricular response spontaneously converted back into sinus rhythm. Lopressor dose has been increased. Patient had CT of the chest which showed no evidence of pulmonary embolism which showed mild mediastinal lymphadenopathy. Patient states he has traveled to [place] last year state there for 2 months. Patient denies any alcohol abuse. Denies any palpitations. States overall feeling better since admission. Patient denies any fever or night sweats weight loss.  Past Medical History:  Diagnosis Date  . Hypertension     Past Surgical History:  Procedure Laterality Date  . IR THORACENTESIS ASP PLEURAL SPACE W/IMG GUIDE  11/18/2017    No family history on file.  Social History:  reports that  has never smoked. he has never used smokeless tobacco. He reports that he does not drink alcohol or use drugs.  Allergies: No Known Allergies  Medications: I have reviewed the patient's current medications.  Results for orders placed or performed during the hospital encounter of 11/16/17 (from the past 48 hour(s))  CBC     Status: Abnormal   Collection Time: 11/18/17  2:47 AM  Result Value Ref Range   WBC 18.2 (H) 4.0 - 10.5 K/uL   RBC 4.90 4.22 - 5.81 MIL/uL   Hemoglobin 12.3 (L) 13.0 - 17.0 g/dL   HCT 38.2 (L) 39.0 - 52.0 %   MCV 78.0 78.0 - 100.0 fL   MCH 25.1 (L) 26.0 - 34.0 pg   MCHC 32.2 30.0 - 36.0 g/dL   RDW 13.0 11.5 - 15.5 %   Platelets 455 (H) 150 - 400 K/uL   Lactate dehydrogenase (pleural or peritoneal fluid)     Status: Abnormal   Collection Time: 11/18/17 12:48 PM  Result Value Ref Range   LD, Fluid 237 (H) 3 - 23 U/L    Comment: (NOTE) Results should be evaluated in conjunction with serum values    Fluid Type-FLDH Pleural, L   Body fluid cell count with differential     Status: Abnormal   Collection Time: 11/18/17 12:48 PM  Result Value Ref Range   Fluid Type-FCT Pleural, L    Color, Fluid AMBER (A) YELLOW   Appearance, Fluid CLOUDY (A) CLEAR   WBC, Fluid 645 0 - 1,000 cu mm   Neutrophil Count, Fluid 10 0 - 25 %   Lymphs, Fluid 69 %   Monocyte-Macrophage-Serous Fluid 21 (L) 50 - 90 %   Other Cells, Fluid MESOTHELIAL CELLS SEEN %  Gram stain     Status: None   Collection Time: 11/18/17 12:48 PM  Result Value Ref Range   Specimen Description PLEURAL LEFT    Special Requests NONE    Gram Stain      FEW WBC PRESENT, PREDOMINANTLY MONONUCLEAR NO ORGANISMS SEEN    Report Status 11/18/2017 FINAL   Protein, pleural or peritoneal fluid     Status: None   Collection Time: 11/18/17 12:48 PM  Result Value Ref Range   Total protein, fluid 4.8 g/dL    Comment: (NOTE) No normal range established for this test Results should be  evaluated in conjunction with serum values    Fluid Type-FTP Pleural, L   Culture, body fluid-bottle     Status: None (Preliminary result)   Collection Time: 11/18/17 12:48 PM  Result Value Ref Range   Specimen Description PLEURAL LEFT    Special Requests NONE    Culture NO GROWTH < 24 HOURS    Report Status PENDING   Lactate dehydrogenase     Status: None   Collection Time: 11/18/17  2:09 PM  Result Value Ref Range   LDH 141 98 - 192 U/L  Troponin I     Status: None   Collection Time: 11/18/17  2:09 PM  Result Value Ref Range   Troponin I <0.03 <0.03 ng/mL  CBC     Status: Abnormal   Collection Time: 11/19/17  5:03 AM  Result Value Ref Range   WBC 15.1 (H) 4.0 - 10.5 K/uL   RBC 4.56 4.22 - 5.81  MIL/uL   Hemoglobin 11.3 (L) 13.0 - 17.0 g/dL   HCT 35.7 (L) 39.0 - 52.0 %   MCV 78.3 78.0 - 100.0 fL   MCH 24.8 (L) 26.0 - 34.0 pg   MCHC 31.7 30.0 - 36.0 g/dL   RDW 13.6 11.5 - 15.5 %   Platelets 413 (H) 150 - 400 K/uL  Basic metabolic panel     Status: Abnormal   Collection Time: 11/19/17  5:03 AM  Result Value Ref Range   Sodium 136 135 - 145 mmol/L   Potassium 3.9 3.5 - 5.1 mmol/L   Chloride 103 101 - 111 mmol/L   CO2 23 22 - 32 mmol/L   Glucose, Bld 101 (H) 65 - 99 mg/dL   BUN 14 6 - 20 mg/dL   Creatinine, Ser 0.99 0.61 - 1.24 mg/dL   Calcium 8.3 (L) 8.9 - 10.3 mg/dL   GFR calc non Af Amer >60 >60 mL/min   GFR calc Af Amer >60 >60 mL/min    Comment: (NOTE) The eGFR has been calculated using the CKD EPI equation. This calculation has not been validated in all clinical situations. eGFR's persistently <60 mL/min signify possible Chronic Kidney Disease.    Anion gap 10 5 - 15  Sedimentation rate     Status: Abnormal   Collection Time: 11/19/17  2:52 PM  Result Value Ref Range   Sed Rate 70 (H) 0 - 16 mm/hr    Dg Chest 1 View  Result Date: 11/18/2017 CLINICAL DATA:  Post left-sided thoracentesis. History of hypertension. EXAM: CHEST 1 VIEW COMPARISON:  11/16/2017; chest CT-11/16/2010 FINDINGS: Interval reduction/near resolution of residual trace left-sided effusion post thoracentesis. No pneumothorax. Unchanged trace right-sided pleural effusion. Improved aeration of the left lower lung with persistent left mid and basilar heterogeneous/consolidative opacities. Pulmonary vasculature remains indistinct with cephalization of flow. No acute osseus abnormalities. IMPRESSION: 1. Interval reduction/near resolution of left-sided effusion post thoracentesis. No pneumothorax. 2. Improved aeration of left lower lung with similar findings of suspected pulmonary edema and bibasilar atelectasis. Electronically Signed   By: Sandi Mariscal M.D.   On: 11/18/2017 12:58   Ct Angio Chest Pe W Or Wo  Contrast  Result Date: 11/18/2017 CLINICAL DATA:  Shortness of breath.  Recent thoracentesis. EXAM: CT ANGIOGRAPHY CHEST WITH CONTRAST TECHNIQUE: Multidetector CT imaging of the chest was performed using the standard protocol during bolus administration of intravenous contrast. Multiplanar CT image reconstructions and MIPs were obtained to evaluate the vascular anatomy. CONTRAST:  81m ISOVUE-370 IOPAMIDOL (ISOVUE-370) INJECTION 76% COMPARISON:  Chest radiograph  11/18/2017 FINDINGS: Cardiovascular: Contrast injection is sufficient to demonstrate satisfactory opacification of the pulmonary arteries to the segmental level. There is no pulmonary embolus. The main pulmonary artery is within normal limits for size. There is a normal 3-vessel arch branching pattern without evidence of acute aortic syndrome. There is noaortic atherosclerosis. Heart size is enlarged. Small pericardial effusion is unchanged. Mediastinum/Nodes: No mediastinal, hilar or axillary lymphadenopathy. The visualized thyroid and thoracic esophageal course are unremarkable. Lungs/Pleura: Ground-glass opacities throughout the lingula and left lower lobe with markedly decreased size of pleural effusion, which is now small. Unchanged medium-sized right pleural effusion. Mild right basilar atelectasis. No pneumothorax. Upper Abdomen: Contrast bolus timing is not optimized for evaluation of the abdominal organs. Within this limitation, the visualized organs of the upper abdomen are normal. Musculoskeletal: No chest wall abnormality. No acute or significant osseous findings. Review of the MIP images confirms the above findings. IMPRESSION: 1. No pulmonary embolus. 2. Greatly decreased size of left pleural effusion following thoracentesis with multifocal ground-glass opacity throughout the lingula and left lower lobe likely indicating re-expansion pulmonary edema. 3. No pneumothorax. 4. Small pericardial effusion, unchanged. Electronically Signed   By:  Ulyses Jarred M.D.   On: 11/18/2017 18:48   Ir Thoracentesis Asp Pleural Space W/img Guide  Result Date: 11/18/2017 INDICATION: Pneumonia with large left pleural effusion. Request is made for diagnostic and therapeutic thoracentesis. EXAM: ULTRASOUND GUIDED DIAGNOSTIC AND THERAPEUTIC THORACENTESIS MEDICATIONS: 2% lidocaine COMPLICATIONS: None immediate. PROCEDURE: An ultrasound guided thoracentesis was thoroughly discussed with the patient and questions answered. The benefits, risks, alternatives and complications were also discussed. The patient understands and wishes to proceed with the procedure. Written consent was obtained. Ultrasound was performed to localize and mark an adequate pocket of fluid in the left chest. The area was then prepped and draped in the normal sterile fashion. 2% lidocaine was used for local anesthesia. Under ultrasound guidance a Safe-T-Centesis catheter was introduced. Thoracentesis was performed. The catheter was removed and a dressing applied. FINDINGS: A total of approximately 1.4 L of amber fluid was removed. Samples were sent to the laboratory as requested by the clinical team. IMPRESSION: Successful ultrasound guided left thoracentesis yielding 1.4 L of pleural fluid. Read by: Saverio Danker, PA-C Electronically Signed   By: Jacqulynn Cadet M.D.   On: 11/18/2017 14:05    Review of Systems  Constitutional: Negative for chills, fever and weight loss.  Eyes: Positive for blurred vision.  Respiratory: Positive for cough and shortness of breath.   Cardiovascular: Positive for chest pain.  Gastrointestinal: Negative for abdominal pain and vomiting.  Neurological: Negative for dizziness.   Blood pressure (!) 138/92, pulse (!) 119, temperature 99.1 F (37.3 C), temperature source Oral, resp. rate (!) 21, height _0  (1.727 m), weight 91.6 kg (201 lb 15.1 oz), SpO2 94 %. Physical Exam  Constitutional: He is oriented to person, place, and time.  HENT:  Head:  Normocephalic and atraumatic.  Eyes: Conjunctivae are normal. Pupils are equal, round, and reactive to light. Left eye exhibits no discharge. No scleral icterus.  Neck: Neck supple. No JVD present. No thyromegaly present.  Cardiovascular:  Tachycardic S1 and S2 soft no pericardial rub appreciated  Respiratory:  Decreased breath sound at bases left more than right  GI: Soft. Bowel sounds are normal. He exhibits no distension. There is no tenderness. There is no rebound.  Musculoskeletal: He exhibits no edema, tenderness or deformity.  Neurological: He is alert and oriented to person, place, and time.  Assessment/Plan: Left PNA with exudative effusion/mediastinal lymphadenopathy rule out DVT Status post paroxysmal A. fib flutter chadsvasc score of 1 secondary to above no evidence of pericarditis. Hypertension Plan Agree with increasing Lopressor to 50 mg twice daily and starting on aspirin Will consider short-term amiodarone if continues to have recurrent episodes of A. fib/flutter with RVR Check all cultures Check TSH Charolette Forward 11/19/2017, 6:48 PM

## 2017-11-19 NOTE — Progress Notes (Signed)
Pt's HR is going between-flutter, a-fib, and NSR. HR stays in the 120's-150's. MD Jarvis NewcomerGrunz made aware. Will continue to assess.

## 2017-11-20 LAB — CBC
HEMATOCRIT: 36.9 % — AB (ref 39.0–52.0)
HEMOGLOBIN: 12 g/dL — AB (ref 13.0–17.0)
MCH: 25.2 pg — AB (ref 26.0–34.0)
MCHC: 32.5 g/dL (ref 30.0–36.0)
MCV: 77.5 fL — AB (ref 78.0–100.0)
Platelets: 398 10*3/uL (ref 150–400)
RBC: 4.76 MIL/uL (ref 4.22–5.81)
RDW: 13.1 % (ref 11.5–15.5)
WBC: 15 10*3/uL — ABNORMAL HIGH (ref 4.0–10.5)

## 2017-11-20 LAB — MPO/PR-3 (ANCA) ANTIBODIES
ANCA Proteinase 3: <3.5 U/mL (ref 0.0–3.5)
Myeloperoxidase Abs: <9 U/mL (ref 0.0–9.0)

## 2017-11-20 LAB — ANTINUCLEAR ANTIBODIES, IFA: ANA Ab, IFA: NEGATIVE

## 2017-11-20 LAB — ANCA TITERS

## 2017-11-20 LAB — RHEUMATOID FACTOR: Rhuematoid fact SerPl-aCnc: 15.8 IU/mL — ABNORMAL HIGH (ref 0.0–13.9)

## 2017-11-20 MED ORDER — ASPIRIN EC 81 MG PO TBEC
81.0000 mg | DELAYED_RELEASE_TABLET | Freq: Every day | ORAL | Status: DC
  Administered 2017-11-20 – 2017-12-04 (×15): 81 mg via ORAL
  Filled 2017-11-20 (×15): qty 1

## 2017-11-20 MED ORDER — ASPIRIN 81 MG PO CHEW: 81.0000 mg | CHEWABLE_TABLET | Freq: Every day | ORAL | Status: DC

## 2017-11-20 NOTE — Plan of Care (Signed)
  Progressing Pain Managment: General experience of comfort will improve 11/20/2017 2331 - Progressing by Peggye Pitt, RN Safety: Ability to remain free from injury will improve 11/20/2017 2331 - Progressing by Peggye Pitt, RN   Completed/Met Education: Knowledge of General Education information will improve 11/20/2017 2331 - Completed/Met by Peggye Pitt, RN Activity: Risk for activity intolerance will decrease 11/20/2017 2331 - Completed/Met by Peggye Pitt, RN Nutrition: Adequate nutrition will be maintained 11/20/2017 2331 - Completed/Met by Peggye Pitt, RN Skin Integrity: Risk for impaired skin integrity will decrease 11/20/2017 2331 - Completed/Met by Peggye Pitt, RN

## 2017-11-20 NOTE — Progress Notes (Signed)
Subjective:  Denies any chest pain.  States breathing is improved markedly.  No further episodes of A. Fib flutter on the monitor.  Objective:  Vital Signs in the last 24 hours: Temp:  [98.3 F (36.8 C)-99.9 F (37.7 C)] 98.3 F (36.8 C) (01/25 1237) Pulse Rate:  [110-119] 114 (01/25 0902) Resp:  [17] 17 (01/25 0648) BP: (127-151)/(92-112) 135/97 (01/25 1237) SpO2:  [94 %-100 %] 96 % (01/25 1237)  Intake/Output from previous day: 01/24 0701 - 01/25 0700 In: 220 [P.O.:220] Out: 600 [Urine:600] Intake/Output from this shift: Total I/O In: 3 [I.V.:3] Out: -   Physical Exam: Neck: no adenopathy, no carotid bruit, no JVD and supple, symmetrical, trachea midline Lungs: decreased breath sounds at bases Heart: regular rate and rhythm, S1, S2 normal and soft systolic murmur noted. no pericardial rub Abdomen: soft, non-tender; bowel sounds normal; no masses,  no organomegaly Extremities: extremities normal, atraumatic, no cyanosis or edema  Lab Results: Recent Labs    11/19/17 0503 11/20/17 0424  WBC 15.1* 15.0*  HGB 11.3* 12.0*  PLT 413* 398   Recent Labs    11/19/17 0503  NA 136  K 3.9  CL 103  CO2 23  GLUCOSE 101*  BUN 14  CREATININE 0.99   Recent Labs    11/18/17 1409  TROPONINI <0.03   Hepatic Function Panel No results for input(s): PROT, ALBUMIN, AST, ALT, ALKPHOS, BILITOT, BILIDIR, IBILI in the last 72 hours. No results for input(s): CHOL in the last 72 hours. No results for input(s): PROTIME in the last 72 hours.  Imaging: Imaging results have been reviewed and Ct Angio Chest Pe W Or Wo Contrast  Result Date: 11/18/2017 CLINICAL DATA:  Shortness of breath.  Recent thoracentesis. EXAM: CT ANGIOGRAPHY CHEST WITH CONTRAST TECHNIQUE: Multidetector CT imaging of the chest was performed using the standard protocol during bolus administration of intravenous contrast. Multiplanar CT image reconstructions and MIPs were obtained to evaluate the vascular anatomy.  CONTRAST:  50mL ISOVUE-370 IOPAMIDOL (ISOVUE-370) INJECTION 76% COMPARISON:  Chest radiograph 11/18/2017 FINDINGS: Cardiovascular: Contrast injection is sufficient to demonstrate satisfactory opacification of the pulmonary arteries to the segmental level. There is no pulmonary embolus. The main pulmonary artery is within normal limits for size. There is a normal 3-vessel arch branching pattern without evidence of acute aortic syndrome. There is noaortic atherosclerosis. Heart size is enlarged. Small pericardial effusion is unchanged. Mediastinum/Nodes: No mediastinal, hilar or axillary lymphadenopathy. The visualized thyroid and thoracic esophageal course are unremarkable. Lungs/Pleura: Ground-glass opacities throughout the lingula and left lower lobe with markedly decreased size of pleural effusion, which is now small. Unchanged medium-sized right pleural effusion. Mild right basilar atelectasis. No pneumothorax. Upper Abdomen: Contrast bolus timing is not optimized for evaluation of the abdominal organs. Within this limitation, the visualized organs of the upper abdomen are normal. Musculoskeletal: No chest wall abnormality. No acute or significant osseous findings. Review of the MIP images confirms the above findings. IMPRESSION: 1. No pulmonary embolus. 2. Greatly decreased size of left pleural effusion following thoracentesis with multifocal ground-glass opacity throughout the lingula and left lower lobe likely indicating re-expansion pulmonary edema. 3. No pneumothorax. 4. Small pericardial effusion, unchanged. Electronically Signed   By: Deatra Robinson M.D.   On: 11/18/2017 18:48    Cardiac Studies:  Assessment/Plan:  Left PNA with exudative effusion/mediastinal lymphadenopathy rule out TB Status post paroxysmal A. fib flutter chadsvasc score of 1 secondary to above no evidence of pericarditis. Hypertension PLAN Continue present management. No active cardiac issues at  present, I will sign off   Please call if needed   LOS: 4 days    Rinaldo CloudHarwani, Shalik Sanfilippo 11/20/2017, 4:11 PM

## 2017-11-20 NOTE — Progress Notes (Signed)
PROGRESS NOTE  Scott Rowe  DXA:[id number] DOB: [birth year] DOA: 11/16/2017 PCP: None Brief Narrative: Scott Rowe is a 47 y.o. male with a history of HTN not on medications and asthma who presented to the ED with dyspnea, chest pain and productive cough. CT chest demonstrated L > R pleural effusions with compressive atelectasis vs. infiltrate with nonspecific enlarged LNs. He was hypoxic, started on levaquin and supplemental oxygen. Left thoracentesis of 1.4L on 1/23 showed exudate and repeat CTA ruled out PE. No definite mass noted. Cultures have remained negative to date. Cytology from effusion was negative.   Assessment & Plan: Principal Problem:   PNA (pneumonia) Active Problems:   Acute respiratory failure with hypoxia (HCC)   Hypertension   Pleural effusion  Acute hypoxic respiratory failure: due to pneumonia, atelectasis, pleural effusions. BNP wnl. Echo with EF 65-70%, normal wall motion and diastolic function. Normal RV. IVC normal caliber.  - Improving.   - Continue levaquin - Monitor blood and sputum cultures  Exudative pleural effusions: L > R, left thoracentesis 1/23, negative cytology. WBC count not very consistent with parapneumonic effusion. Autoimmune work up pending (RF weakly positive at 15.8, ANA neg, ESR grossly elevated at 70), quantiferon pending (empiric airborne precautions).   Paroxysmal atrial flutter: Echocardiogram shows normal atria. TSH 1.481.  - Start ASA. - Increased metoprolol to 71m BID, rate improving. Would tolerate increased dosing given ongoing HTN.  - Cardiology consulted.   HTN:  - Started metoprolol.  - Continue IV hydralazine prn severe range HTN.   Pericardial effusion: Small, not hemodynamically significant. No rub.   DVT prophylaxis: SCDs Code Status: Full Family Communication: None at bedside Disposition Plan: Uncertain, pending further work up.   Consultants:   Pulmonology  Cardiology  Procedures:   Echocardiogram  11/18/2017: - Left ventricle: The cavity size was normal. Wall thickness was   increased in a pattern of mild LVH. Systolic function was   vigorous. The estimated ejection fraction was in the range of 65%   to 70%. Wall motion was normal; there were no regional wall   motion abnormalities. Left ventricular diastolic function   parameters were normal. - Pericardium, extracardiac: A trivial pericardial effusion was   identified posterior to the heart. There was no evidence of   hemodynamic compromise.  U/S-guided left thoracentesis 1.4L amber fluid 11/18/2017  Antimicrobials:  Levaquin 1/21 >>    Subjective: Having upper chest pains when laying on either side. Dyspnea improved, still having cough. Growing more anxious about diagnostic uncertainty.   Objective: Vitals:   11/19/17 2149 11/20/17 0648 11/20/17 0902 11/20/17 1237  BP: (!) 127/99 (!) 151/92 (!) 145/112 (!) 135/97  Pulse: (!) 110 (!) 114 (!) 114   Resp: 17 17    Temp: 99.9 F (37.7 C) 99.7 F (37.6 C)  98.3 F (36.8 C)  TempSrc: Oral Oral  Oral  SpO2: 96% 100%  96%  Weight:      Height:        Intake/Output Summary (Last 24 hours) at 11/20/2017 1429 Last data filed at 11/20/2017 [id number] per 24 hour  Intake 3 ml  Output -  Net 3 ml   Filed Weights   11/16/17 1110 11/16/17 2236  Weight: 91.6 kg (202 lb) 91.6 kg (201 lb 15.1 oz)    Gen: 47y.o. male in no distress Pulm: Tachypneic, left crackles, no wheezes.  CV: Regular tachycardia. No murmur, rub, or gallop. No JVD, no pedal edema. GI: Abdomen soft, non-tender, non-distended, with normoactive bowel sounds.  No organomegaly or masses felt. Ext: Warm, no deformities Skin: No rashes, lesions no ulcers Neuro: Alert and oriented. No focal neurological deficits. Psych: Judgement and insight appear normal. Mood & affect appropriate.   Data Reviewed: I have personally reviewed following labs and imaging studies  CBC: Recent Labs  Lab 11/16/17 1108  11/17/17 0428 11/18/17 0247 11/19/17 0503 11/20/17 0424  WBC 12.8* 14.1* 18.2* 15.1* 15.0*  HGB 11.7* 11.8* 12.3* 11.3* 12.0*  HCT 36.0* 36.4* 38.2* 35.7* 36.9*  MCV 78.8 78.3 78.0 78.3 77.5*  PLT 457* 439* 455* 413* 215   Basic Metabolic Panel: Recent Labs  Lab 11/16/17 1108 11/17/17 0428 11/19/17 0503  NA 137 136 136  K 3.9 4.0 3.9  CL 103 101 103  CO2 23 20* 23  GLUCOSE 108* 99 101*  BUN 10 10 14   CREATININE 1.17 0.92 0.99  CALCIUM 8.5* 8.5* 8.3*   GFR: Estimated Creatinine Clearance: 103.6 mL/min (by C-G formula based on SCr of 0.99 mg/dL). Liver Function Tests: Recent Labs  Lab 11/16/17 1256  AST 20  ALT 33  ALKPHOS 138*  BILITOT 0.8  PROT 7.1  ALBUMIN 2.5*   No results for input(s): LIPASE, AMYLASE in the last 168 hours. No results for input(s): AMMONIA in the last 168 hours. Coagulation Profile: No results for input(s): INR, PROTIME in the last 168 hours. Cardiac Enzymes: Recent Labs  Lab 11/18/17 1409  TROPONINI <0.03   BNP (last 3 results) No results for input(s): PROBNP in the last 8760 hours. HbA1C: No results for input(s): HGBA1C in the last 72 hours. CBG: No results for input(s): GLUCAP in the last 168 hours. Lipid Profile: No results for input(s): CHOL, HDL, LDLCALC, TRIG, CHOLHDL, LDLDIRECT in the last 72 hours. Thyroid Function Tests: No results for input(s): TSH, T4TOTAL, FREET4, T3FREE, THYROIDAB in the last 72 hours. Anemia Panel: No results for input(s): VITAMINB12, FOLATE, FERRITIN, TIBC, IRON, RETICCTPCT in the last 72 hours. Urine analysis: No results found for: COLORURINE, APPEARANCEUR, LABSPEC, [place], GLUCOSEU, [place], [place], [place], [place], [place], NITRITE, LEUKOCYTESUR Recent Results (from the past 240 hour(s))  Culture, blood (routine x 2)     Status: None (Preliminary result)   Collection Time: 11/16/17  1:25 PM  Result Value Ref Range Status   Specimen Description BLOOD RIGHT ANTECUBITAL  Final    Special Requests   Final    BOTTLES DRAWN AEROBIC AND ANAEROBIC Blood Culture adequate volume   Culture NO GROWTH 4 DAYS  Final   Report Status PENDING  Incomplete  Culture, blood (routine x 2)     Status: None (Preliminary result)   Collection Time: 11/16/17  3:09 PM  Result Value Ref Range Status   Specimen Description BLOOD LEFT ANTECUBITAL  Final   Special Requests   Final    BOTTLES DRAWN AEROBIC AND ANAEROBIC Blood Culture adequate volume   Culture NO GROWTH 4 DAYS  Final   Report Status PENDING  Incomplete  Gram stain     Status: None   Collection Time: 11/18/17 12:48 PM  Result Value Ref Range Status   Specimen Description PLEURAL LEFT  Final   Special Requests NONE  Final   Gram Stain   Final    FEW WBC PRESENT, PREDOMINANTLY MONONUCLEAR NO ORGANISMS SEEN    Report Status 11/18/2017 FINAL  Final  Culture, body fluid-bottle     Status: None (Preliminary result)   Collection Time: 11/18/17 12:48 PM  Result Value Ref Range Status   Specimen Description PLEURAL LEFT  Final  Special Requests NONE  Final   Culture NO GROWTH 2 DAYS  Final   Report Status PENDING  Incomplete      Radiology Studies: Ct Angio Chest Pe W Or Wo Contrast  Result Date: 11/18/2017 CLINICAL DATA:  Shortness of breath.  Recent thoracentesis. EXAM: CT ANGIOGRAPHY CHEST WITH CONTRAST TECHNIQUE: Multidetector CT imaging of the chest was performed using the standard protocol during bolus administration of intravenous contrast. Multiplanar CT image reconstructions and MIPs were obtained to evaluate the vascular anatomy. CONTRAST:  36m ISOVUE-370 IOPAMIDOL (ISOVUE-370) INJECTION 76% COMPARISON:  Chest radiograph 11/18/2017 FINDINGS: Cardiovascular: Contrast injection is sufficient to demonstrate satisfactory opacification of the pulmonary arteries to the segmental level. There is no pulmonary embolus. The main pulmonary artery is within normal limits for size. There is a normal 3-vessel arch branching pattern  without evidence of acute aortic syndrome. There is noaortic atherosclerosis. Heart size is enlarged. Small pericardial effusion is unchanged. Mediastinum/Nodes: No mediastinal, hilar or axillary lymphadenopathy. The visualized thyroid and thoracic esophageal course are unremarkable. Lungs/Pleura: Ground-glass opacities throughout the lingula and left lower lobe with markedly decreased size of pleural effusion, which is now small. Unchanged medium-sized right pleural effusion. Mild right basilar atelectasis. No pneumothorax. Upper Abdomen: Contrast bolus timing is not optimized for evaluation of the abdominal organs. Within this limitation, the visualized organs of the upper abdomen are normal. Musculoskeletal: No chest wall abnormality. No acute or significant osseous findings. Review of the MIP images confirms the above findings. IMPRESSION: 1. No pulmonary embolus. 2. Greatly decreased size of left pleural effusion following thoracentesis with multifocal ground-glass opacity throughout the lingula and left lower lobe likely indicating re-expansion pulmonary edema. 3. No pneumothorax. 4. Small pericardial effusion, unchanged. Electronically Signed   By: KUlyses JarredM.D.   On: 11/18/2017 18:48    Scheduled Meds: . aspirin EC  81 mg Oral Daily  . levofloxacin  500 mg Oral Q1500  . metoprolol tartrate  50 mg Oral BID  . sodium chloride flush  3 mL Intravenous Q12H   Continuous Infusions: . sodium chloride       LOS: 4 days   Time spent: 25 minutes.  RVance Gather MD [facility] Pager [phone] If 7PM-7AM, please contact night-coverage [email or web address] Password [email or web address]1/25/2019, 2:29 PM

## 2017-11-21 ENCOUNTER — Inpatient Hospital Stay (HOSPITAL_COMMUNITY): Payer: Self-pay

## 2017-11-21 LAB — CULTURE, BLOOD (ROUTINE X 2)
CULTURE: NO GROWTH
Culture: NO GROWTH
Special Requests: ADEQUATE
Special Requests: ADEQUATE

## 2017-11-21 LAB — CBC
HEMATOCRIT: 35.8 % — AB (ref 39.0–52.0)
HEMOGLOBIN: 11.7 g/dL — AB (ref 13.0–17.0)
MCH: 25.3 pg — AB (ref 26.0–34.0)
MCHC: 32.7 g/dL (ref 30.0–36.0)
MCV: 77.3 fL — AB (ref 78.0–100.0)
Platelets: 424 10*3/uL — ABNORMAL HIGH (ref 150–400)
RBC: 4.63 MIL/uL (ref 4.22–5.81)
RDW: 13.1 % (ref 11.5–15.5)
WBC: 14.2 10*3/uL — ABNORMAL HIGH (ref 4.0–10.5)

## 2017-11-21 MED ORDER — METOPROLOL TARTRATE 5 MG/5ML IV SOLN
5.0000 mg | Freq: Once | INTRAVENOUS | Status: AC | PRN
  Administered 2017-11-21: 5 mg via INTRAVENOUS
  Filled 2017-11-21: qty 5

## 2017-11-21 MED ORDER — DILTIAZEM HCL ER COATED BEADS 120 MG PO CP24
120.0000 mg | ORAL_CAPSULE | Freq: Every day | ORAL | Status: DC
  Administered 2017-11-21 – 2017-11-22 (×2): 120 mg via ORAL
  Filled 2017-11-21 (×2): qty 1

## 2017-11-21 MED ORDER — METOPROLOL TARTRATE 100 MG PO TABS
100.0000 mg | ORAL_TABLET | Freq: Two times a day (BID) | ORAL | Status: DC
  Administered 2017-11-21 – 2017-12-04 (×26): 100 mg via ORAL
  Filled 2017-11-21 (×27): qty 1

## 2017-11-21 MED ORDER — METOPROLOL TARTRATE 50 MG PO TABS
50.0000 mg | ORAL_TABLET | Freq: Once | ORAL | Status: AC
  Administered 2017-11-21: 50 mg via ORAL
  Filled 2017-11-21: qty 1

## 2017-11-21 NOTE — Progress Notes (Signed)
PROGRESS NOTE  Scott Rowe  PYP:[id number] DOB: 1970-11-01 DOA: 11/16/2017 PCP: None Brief Narrative: Scott Rowe is a 47 y.o. male with a history of HTN not on medications and asthma who presented to the ED with dyspnea, chest pain and productive cough. CT chest demonstrated L > R pleural effusions with compressive atelectasis vs. infiltrate with nonspecific enlarged LNs. He was hypoxic, started on levaquin and supplemental oxygen. Left thoracentesis of 1.4L on 1/23 showed exudate and repeat CTA ruled out PE. No definite mass noted. Cultures have remained negative to date. Cytology from effusion was negative.   Assessment & Plan: Principal Problem:   PNA (pneumonia) Active Problems:   Acute respiratory failure with hypoxia (HCC)   Hypertension   Pleural effusion  Acute hypoxic respiratory failure: due to pneumonia, atelectasis, pleural effusions. BNP wnl. Echo with EF 65-70%, normal wall motion and diastolic function. Normal RV. IVC normal caliber.   - Continue levaquin x7 days. With ongoing leukocytosis. Awaiting pulm input.  - Monitor blood and sputum cultures  Exudative pleural effusions: L > R, left thoracentesis 1/23, negative cytology. WBC count not very consistent with parapneumonic effusion. Autoimmune work up (RF weakly positive at 15.8, ANA neg, ESR grossly elevated at 70), quantiferon pending (empiric airborne precautions).  - Repeat CXR shows continued effusion and infiltrate.  Paroxysmal atrial flutter: Echocardiogram shows normal atria. TSH 1.481.  - Started ASA for CHA2DS2-VASc of 1 (HTN). - Increase metoprolol to 154m po BID, give 518mnow. Discussed elevated rate with cardiology, who will also start diltiazem.   HTN:  - Started metoprolol, diltiazem.  - Continue IV hydralazine prn severe range HTN.   Pericardial effusion: Small, not hemodynamically significant. No rub.   DVT prophylaxis: SCDs Code Status: Full Family Communication: None at bedside Disposition Plan:  Uncertain, pending further work up.   Consultants:   Pulmonology  Cardiology  Procedures:   Echocardiogram 11/18/2017: - Left ventricle: The cavity size was normal. Wall thickness was   increased in a pattern of mild LVH. Systolic function was   vigorous. The estimated ejection fraction was in the range of 65%   to 70%. Wall motion was normal; there were no regional wall   motion abnormalities. Left ventricular diastolic function   parameters were normal. - Pericardium, extracardiac: A trivial pericardial effusion was   identified posterior to the heart. There was no evidence of   hemodynamic compromise.  U/S-guided left thoracentesis 1.4L amber fluid 11/18/2017  Antimicrobials:  Levaquin 1/21 >>    Subjective: Does not feel he is improving, very nervous about what's going on with him. Worried about elevated heart rate. Atypical chest pain continues when he's trying to sleep.   Objective: Vitals:   11/20/17 1237 11/20/17 1900 11/21/17 0404 11/21/17 0852  BP: (!) 135/97 (!) 124/99 (!) 157/105 132/89  Pulse:  (!) 112 (!) 132 (!) 120  Resp:  (!) 23 16   Temp: 98.3 F (36.8 C) 98.6 F (37 C) 99 F (37.2 C)   TempSrc: Oral Oral Oral   SpO2: 96% 95% 96% 98%  Weight:      Height:       Gen: [age].o. male in no distress Pulm: Tachypneic on supplemental oxygen, decreased at left base CV: Regular tachycardia up to 160 transiently at rest. No murmur, rub, or gallop. No JVD, no pedal edema. GI: Abdomen soft, non-tender, non-distended, with normoactive bowel sounds. No organomegaly or masses felt. Ext: Warm, no deformities Skin: No rashes, lesions no ulcers Neuro: Alert and oriented.  No focal neurological deficits. Psych: Judgement and insight appear normal. Mood & affect appropriate.   CBC: Recent Labs  Lab 11/17/17 0428 11/18/17 0247 11/19/17 0503 11/20/17 0424 11/21/17 0359  WBC 14.1* 18.2* 15.1* 15.0* 14.2*  HGB 11.8* 12.3* 11.3* 12.0* 11.7*  HCT 36.4* 38.2* 35.7*  36.9* 35.8*  MCV 78.3 78.0 78.3 77.5* 77.3*  PLT 439* 455* 413* 398 943*   Basic Metabolic Panel: Recent Labs  Lab 11/16/17 1108 11/17/17 0428 11/19/17 0503  NA 137 136 136  K 3.9 4.0 3.9  CL 103 101 103  CO2 23 20* 23  GLUCOSE 108* 99 101*  BUN 10 10 14   CREATININE 1.17 0.92 0.99  CALCIUM 8.5* 8.5* 8.3*   GFR: Estimated Creatinine Clearance: 103.6 mL/min (by C-G formula based on SCr of 0.99 mg/dL). Liver Function Tests: Recent Labs  Lab 11/16/17 1256  AST 20  ALT 33  ALKPHOS 138*  BILITOT 0.8  PROT 7.1  ALBUMIN 2.5*   Cardiac Enzymes: Recent Labs  Lab 11/18/17 1409  TROPONINI <0.03    Recent Results (from the past 240 hour(s))  Culture, blood (routine x 2)     Status: None   Collection Time: 11/16/17  1:25 PM  Result Value Ref Range Status   Specimen Description BLOOD RIGHT ANTECUBITAL  Final   Special Requests   Final    BOTTLES DRAWN AEROBIC AND ANAEROBIC Blood Culture adequate volume   Culture NO GROWTH 5 DAYS  Final   Report Status 11/21/2017 FINAL  Final  Culture, blood (routine x 2)     Status: None   Collection Time: 11/16/17  3:09 PM  Result Value Ref Range Status   Specimen Description BLOOD LEFT ANTECUBITAL  Final   Special Requests   Final    BOTTLES DRAWN AEROBIC AND ANAEROBIC Blood Culture adequate volume   Culture NO GROWTH 5 DAYS  Final   Report Status 11/21/2017 FINAL  Final  Gram stain     Status: None   Collection Time: 11/18/17 12:48 PM  Result Value Ref Range Status   Specimen Description PLEURAL LEFT  Final   Special Requests NONE  Final   Gram Stain   Final    FEW WBC PRESENT, PREDOMINANTLY MONONUCLEAR NO ORGANISMS SEEN    Report Status 11/18/2017 FINAL  Final  Culture, body fluid-bottle     Status: None (Preliminary result)   Collection Time: 11/18/17 12:48 PM  Result Value Ref Range Status   Specimen Description PLEURAL LEFT  Final   Special Requests NONE  Final   Culture NO GROWTH 3 DAYS  Final   Report Status PENDING   Incomplete      Radiology Studies: Dg Chest Portable 1 View  Result Date: 11/21/2017 CLINICAL DATA:  Pleural effusion. EXAM: PORTABLE CHEST 1 VIEW COMPARISON:  11/18/2017 FINDINGS: Heart size upper limits of normal. Left mid and lower lung bronchopneumonia remains evident. There are pleural effusions, larger on the left than the right. Right lung appears otherwise clear. IMPRESSION: Persistent pneumonia in the left mid and lower lung. Pleural effusions left larger than right. Electronically Signed   By: Nelson Chimes M.D.   On: 11/21/2017 15:17    Time spent: 25 minutes.  Vance Gather, MD [facility] Pager [phone]  If 7PM-7AM, please contact night-coverage [email or web address] Password [email or web address] 11/21/2017, 4:20 PM

## 2017-11-21 NOTE — Progress Notes (Signed)
Notified from CCMD, patient went from sinus to flutter beats back to sinus. Heart rate up to 160's and return to 120s. Notified on call [facility]. Order given for 5 mg metoprolol. Notified Elink to review. Elink will call back after reviewing. Will continue to monitor.

## 2017-11-21 NOTE — Consult Note (Signed)
Ref: Patient, No Pcp Per   Subjective:  Recurrent tachycardia per nurse. Curently in sinus rhythm with heart rate 100-120 bpm. Positive mild cough. T max 99 degree F. Mild LVH with normal LV systolic and valvular function per echocardiogram.  Objective:  Vital Signs in the last 24 hours: Temp:  [98.6 F (37 C)-99 F (37.2 C)] 99 F (37.2 C) (01/26 0404) Pulse Rate:  [112-132] 120 (01/26 0852) Cardiac Rhythm: Sinus tachycardia (01/26 0701) Resp:  [16-23] 16 (01/26 0404) BP: (124-157)/(89-105) 132/89 (01/26 0852) SpO2:  [95 %-98 %] 98 % (01/26 0852)  Physical Exam: BP Readings from Last 1 Encounters:  11/21/17 132/89     Wt Readings from Last 1 Encounters:  11/16/17 91.6 kg (201 lb 15.1 oz)    Weight change:  Body mass index is 30.71 kg/m. HEENT: [place]/AT, Eyes-Brown, PERL, EOMI, Conjunctiva-Pink, Sclera-Non-icteric Neck: No JVD, No bruit, Trachea midline. Lungs:  Clearing, Bilateral. Cardiac:  Regular tachycardia, normal S1 and S2, no S3. II/VI systolic murmur. Abdomen:  Soft, non-tender. BS present. Extremities:  No edema present. No cyanosis. No clubbing. CNS: AxOx3, Cranial nerves grossly intact, moves all 4 extremities.  Skin: Warm and dry.   Intake/Output from previous day: 01/25 0701 - 01/26 0700 In: 3 [I.V.:3] Out: -     Lab Results: BMET    Component Value Date/Time   NA 136 11/19/2017 0503   NA 136 11/17/2017 0428   NA 137 11/16/2017 1108   K 3.9 11/19/2017 0503   K 4.0 11/17/2017 0428   K 3.9 11/16/2017 1108   CL 103 11/19/2017 0503   CL 101 11/17/2017 0428   CL 103 11/16/2017 1108   CO2 23 11/19/2017 0503   CO2 20 (L) 11/17/2017 0428   CO2 23 11/16/2017 1108   GLUCOSE 101 (H) 11/19/2017 0503   GLUCOSE 99 11/17/2017 0428   GLUCOSE 108 (H) 11/16/2017 1108   BUN 14 11/19/2017 0503   BUN 10 11/17/2017 0428   BUN 10 11/16/2017 1108   CREATININE 0.99 11/19/2017 0503   CREATININE 0.92 11/17/2017 0428   CREATININE 1.17 11/16/2017 1108   CALCIUM 8.3  (L) 11/19/2017 0503   CALCIUM 8.5 (L) 11/17/2017 0428   CALCIUM 8.5 (L) 11/16/2017 1108   GFRNONAA >60 11/19/2017 0503   GFRNONAA >60 11/17/2017 0428   GFRNONAA >60 11/16/2017 1108   GFRAA >60 11/19/2017 0503   GFRAA >60 11/17/2017 0428   GFRAA >60 11/16/2017 1108   CBC    Component Value Date/Time   WBC 14.2 (H) 11/21/2017 0359   RBC 4.63 11/21/2017 0359   HGB 11.7 (L) 11/21/2017 0359   HCT 35.8 (L) 11/21/2017 0359   PLT 424 (H) 11/21/2017 0359   MCV 77.3 (L) 11/21/2017 0359   MCH 25.3 (L) 11/21/2017 0359   MCHC 32.7 11/21/2017 0359   RDW 13.1 11/21/2017 0359   HEPATIC Function Panel Recent Labs    11/16/17 1256  PROT 7.1   HEMOGLOBIN A1C No components found for: HGA1C,  MPG CARDIAC ENZYMES Lab Results  Component Value Date   TROPONINI <0.03 11/18/2017   BNP No results for input(s): PROBNP in the last 8760 hours. TSH Recent Labs    11/16/17 1803  TSH 1.481   CHOLESTEROL No results for input(s): CHOL in the last 8760 hours.  Scheduled Meds: . aspirin EC  81 mg Oral Daily  . diltiazem  120 mg Oral Daily  . levofloxacin  500 mg Oral Q1500  . metoprolol tartrate  100 mg Oral BID  .  sodium chloride flush  3 mL Intravenous Q12H   Continuous Infusions: . sodium chloride     PRN Meds:.sodium chloride, acetaminophen **OR** acetaminophen, hydrALAZINE, HYDROcodone-acetaminophen, lidocaine (PF), menthol-cetylpyridinium, ondansetron **OR** ondansetron (ZOFRAN) IV, sodium chloride flush  Assessment/Plan: Left sided pneumonia Pleural effusion- improving Paroxysmal atrial fibrillation/flutter Hypertension  Add small dose of diltiazem to improve heart rate. Increase activity as tolerated.   LOS: 5 days    Orpah Cobb  MD  11/21/2017, 1:51 PM

## 2017-11-22 LAB — CBC
HCT: 34.9 % — ABNORMAL LOW (ref 39.0–52.0)
HEMOGLOBIN: 11.4 g/dL — AB (ref 13.0–17.0)
MCH: 25.2 pg — AB (ref 26.0–34.0)
MCHC: 32.7 g/dL (ref 30.0–36.0)
MCV: 77 fL — ABNORMAL LOW (ref 78.0–100.0)
Platelets: 427 10*3/uL — ABNORMAL HIGH (ref 150–400)
RBC: 4.53 MIL/uL (ref 4.22–5.81)
RDW: 13.1 % (ref 11.5–15.5)
WBC: 14.2 10*3/uL — ABNORMAL HIGH (ref 4.0–10.5)

## 2017-11-22 MED ORDER — DILTIAZEM HCL ER COATED BEADS 120 MG PO CP24
120.0000 mg | ORAL_CAPSULE | Freq: Two times a day (BID) | ORAL | Status: DC
  Administered 2017-11-22 – 2017-12-04 (×24): 120 mg via ORAL
  Filled 2017-11-22 (×24): qty 1

## 2017-11-22 NOTE — Progress Notes (Signed)
PROGRESS NOTE  Scott Rowe  YDX:[id number] DOB: 11-Sep-1971 DOA: 11/16/2017 PCP: None Brief Narrative: Scott Rowe is a 47 y.o. male with a history of HTN not on medications and asthma who presented to the ED with dyspnea, chest pain and productive cough. CT chest demonstrated L > R pleural effusions with compressive atelectasis vs. infiltrate with nonspecific enlarged LNs. He was hypoxic, started on levaquin and supplemental oxygen. Left thoracentesis of 1.4L on 1/23 showed exudate and repeat CTA ruled out PE. No definite mass noted. Cultures have remained negative to date. Cytology from effusion was negative.   Assessment & Plan: Principal Problem:   PNA (pneumonia) Active Problems:   Acute respiratory failure with hypoxia (HCC)   Hypertension   Pleural effusion  Acute hypoxic respiratory failure: due to pneumonia, atelectasis, pleural effusions. BNP wnl. Echo with EF 65-70%, normal wall motion and diastolic function. Normal RV. IVC normal caliber.   - Continue levaquin x7 days. With ongoing leukocytosis. Awaiting pulm input, will repeat XR in AM. - Blood cultures neg, pleural fluid Cx and gram stain negative.   Exudative pleural effusions: L > R, left thoracentesis 1/23, negative cytology. WBC count not very consistent with parapneumonic effusion. Autoimmune work up (RF weakly positive at 15.8, ANA neg, ESR grossly elevated at 70), quantiferon pending (empiric airborne precautions).  - Repeat CXR shows continued effusion and infiltrate. Will recheck XR and repeat thoracentesis if needed.   Paroxysmal atrial flutter: Echocardiogram shows normal atria. TSH 1.481.  - Started ASA for CHA2DS2-VASc of 1 (HTN). - Increased metoprolol to 143m po BID, cards increasing diltiazem.   HTN:  - Started metoprolol, diltiazem.  - Continue IV hydralazine prn severe range HTN.   Pericardial effusion: Small, not hemodynamically significant. No rub.   DVT prophylaxis: SCDs Code Status: Full Family  Communication: None at bedside Disposition Plan: Uncertain, pending further work up.   Consultants:   Pulmonology  Cardiology  Procedures:   Echocardiogram 11/18/2017: - Left ventricle: The cavity size was normal. Wall thickness was   increased in a pattern of mild LVH. Systolic function was   vigorous. The estimated ejection fraction was in the range of 65%   to 70%. Wall motion was normal; there were no regional wall   motion abnormalities. Left ventricular diastolic function   parameters were normal. - Pericardium, extracardiac: A trivial pericardial effusion was   identified posterior to the heart. There was no evidence of   hemodynamic compromise.  U/S-guided left thoracentesis 1.4L amber fluid 11/18/2017  Antimicrobials:  Levaquin 1/21 >>    Subjective: Dyspneic when up and walking around, feels migrating pain in upper chest, between shoulder blades, lower lateral left chest intermittently not associated with exertion. Feels anxious about TB test, fluid reaccumulation.    Objective: Vitals:   11/21/17 1756 11/21/17 2003 11/22/17 0346 11/22/17 0749  BP: 110/75 (!) 139/102 121/90 109/72  Pulse: (!) 102 (!) 104    Resp: (!) 24 (!) 21  15  Temp: 97.8 F (36.6 C) 98.9 F (37.2 C) 98.4 F (36.9 C) 98.1 F (36.7 C)  TempSrc: Oral Oral Oral Oral  SpO2: 96% 97% 97%   Weight:      Height:       Gen: 47y.o. male in no distress laying in bed, calm. Pulm: Tachypneic at rest on supplemental oxygen, decreased at both bases. CV: Regular tachycardia improved, still 110bpm at rest intermittently. No murmur, rub, or gallop. No JVD, no pedal edema. GI: Abdomen soft, non-tender, non-distended, with normoactive bowel  sounds. No organomegaly or masses felt. Ext: Warm, no deformities Skin: No rashes or wounds. Neuro: Alert and oriented. No focal neurological deficits. Psych: Judgement and insight appear normal. Mood & affect appropriate.   CBC: Recent Labs  Lab 11/18/17 0247  11/19/17 0503 11/20/17 0424 11/21/17 0359 11/22/17 0311  WBC 18.2* 15.1* 15.0* 14.2* 14.2*  HGB 12.3* 11.3* 12.0* 11.7* 11.4*  HCT 38.2* 35.7* 36.9* 35.8* 34.9*  MCV 78.0 78.3 77.5* 77.3* 77.0*  PLT 455* 413* 398 424* 235*   Basic Metabolic Panel: Recent Labs  Lab 11/16/17 1108 11/17/17 0428 11/19/17 0503  NA 137 136 136  K 3.9 4.0 3.9  CL 103 101 103  CO2 23 20* 23  GLUCOSE 108* 99 101*  BUN _0 CREATININE 1.17 0.92 0.99  CALCIUM 8.5* 8.5* 8.3*   GFR: Estimated Creatinine Clearance: 103.6 mL/min (by C-G formula based on SCr of 0.99 mg/dL). Liver Function Tests: Recent Labs  Lab 11/16/17 1256  AST 20  ALT 33  ALKPHOS 138*  BILITOT 0.8  PROT 7.1  ALBUMIN 2.5*   Cardiac Enzymes: Recent Labs  Lab 11/18/17 1409  TROPONINI <0.03    Recent Results (from the past 240 hour(s))  Culture, blood (routine x 2)     Status: None   Collection Time: 11/16/17  1:25 PM  Result Value Ref Range Status   Specimen Description BLOOD RIGHT ANTECUBITAL  Final   Special Requests   Final    BOTTLES DRAWN AEROBIC AND ANAEROBIC Blood Culture adequate volume   Culture NO GROWTH 5 DAYS  Final   Report Status 11/21/2017 FINAL  Final  Culture, blood (routine x 2)     Status: None   Collection Time: 11/16/17  3:09 PM  Result Value Ref Range Status   Specimen Description BLOOD LEFT ANTECUBITAL  Final   Special Requests   Final    BOTTLES DRAWN AEROBIC AND ANAEROBIC Blood Culture adequate volume   Culture NO GROWTH 5 DAYS  Final   Report Status 11/21/2017 FINAL  Final  Gram stain     Status: None   Collection Time: 11/18/17 12:48 PM  Result Value Ref Range Status   Specimen Description PLEURAL LEFT  Final   Special Requests NONE  Final   Gram Stain   Final    FEW WBC PRESENT, PREDOMINANTLY MONONUCLEAR NO ORGANISMS SEEN    Report Status 11/18/2017 FINAL  Final  Culture, body fluid-bottle     Status: None (Preliminary result)   Collection Time: 11/18/17 12:48 PM  Result  Value Ref Range Status   Specimen Description PLEURAL LEFT  Final   Special Requests NONE  Final   Culture NO GROWTH 4 DAYS  Final   Report Status PENDING  Incomplete      Radiology Studies: Dg Chest Portable 1 View  Result Date: 11/21/2017 CLINICAL DATA:  Pleural effusion. EXAM: PORTABLE CHEST 1 VIEW COMPARISON:  11/18/2017 FINDINGS: Heart size upper limits of normal. Left mid and lower lung bronchopneumonia remains evident. There are pleural effusions, larger on the left than the right. Right lung appears otherwise clear. IMPRESSION: Persistent pneumonia in the left mid and lower lung. Pleural effusions left larger than right. Electronically Signed   By: Nelson Chimes M.D.   On: 11/21/2017 15:17    Time spent: 25 minutes.  Vance Gather, MD [facility] Pager [phone]  If 7PM-7AM, please contact night-coverage [email or web address] Password [email or web address] 11/22/2017, 1:52 PM

## 2017-11-22 NOTE — Consult Note (Signed)
Ref: Patient, No Pcp Per   Subjective:  Sinus tachycardia at times. Awaiting TB test. Cultures are negative so far. T max  98.9 degree F  Objective:  Vital Signs in the last 24 hours: Temp:  [97.8 F (36.6 C)-98.9 F (37.2 C)] 98.1 F (36.7 C) (01/27 0749) Pulse Rate:  [102-104] 104 (01/26 2003) Cardiac Rhythm: Normal sinus rhythm (01/27 0700) Resp:  [15-24] 15 (01/27 0749) BP: (109-139)/(72-102) 109/72 (01/27 0749) SpO2:  [96 %-97 %] 97 % (01/27 0346)  Physical Exam: BP Readings from Last 1 Encounters:  11/22/17 109/72     Wt Readings from Last 1 Encounters:  11/16/17 91.6 kg (201 lb 15.1 oz)    Weight change:  Body mass index is 30.71 kg/m. HEENT: [place]/AT, Eyes-Brown, PERL, EOMI, Conjunctiva-Pink, Sclera-Non-icteric Neck: No JVD, No bruit, Trachea midline. Lungs:  Clearing, Bilateral. Cardiac:  Regular rhythm, normal S1 and S2, no S3. II/VI systolic murmur. Abdomen:  Soft, non-tender. BS present. Extremities:  No edema present. No cyanosis. No clubbing. CNS: AxOx3, Cranial nerves grossly intact, moves all 4 extremities.  Skin: Warm and dry.   Intake/Output from previous day: 01/26 0701 - 01/27 0700 In: 720 [P.O.:720] Out: -     Lab Results: BMET    Component Value Date/Time   NA 136 11/19/2017 0503   NA 136 11/17/2017 0428   NA 137 11/16/2017 1108   K 3.9 11/19/2017 0503   K 4.0 11/17/2017 0428   K 3.9 11/16/2017 1108   CL 103 11/19/2017 0503   CL 101 11/17/2017 0428   CL 103 11/16/2017 1108   CO2 23 11/19/2017 0503   CO2 20 (L) 11/17/2017 0428   CO2 23 11/16/2017 1108   GLUCOSE 101 (H) 11/19/2017 0503   GLUCOSE 99 11/17/2017 0428   GLUCOSE 108 (H) 11/16/2017 1108   BUN 14 11/19/2017 0503   BUN 10 11/17/2017 0428   BUN 10 11/16/2017 1108   CREATININE 0.99 11/19/2017 0503   CREATININE 0.92 11/17/2017 0428   CREATININE 1.17 11/16/2017 1108   CALCIUM 8.3 (L) 11/19/2017 0503   CALCIUM 8.5 (L) 11/17/2017 0428   CALCIUM 8.5 (L) 11/16/2017 1108   GFRNONAA >60 11/19/2017 0503   GFRNONAA >60 11/17/2017 0428   GFRNONAA >60 11/16/2017 1108   GFRAA >60 11/19/2017 0503   GFRAA >60 11/17/2017 0428   GFRAA >60 11/16/2017 1108   CBC    Component Value Date/Time   WBC 14.2 (H) 11/22/2017 0311   RBC 4.53 11/22/2017 0311   HGB 11.4 (L) 11/22/2017 0311   HCT 34.9 (L) 11/22/2017 0311   PLT 427 (H) 11/22/2017 0311   MCV 77.0 (L) 11/22/2017 0311   MCH 25.2 (L) 11/22/2017 0311   MCHC 32.7 11/22/2017 0311   RDW 13.1 11/22/2017 0311   HEPATIC Function Panel Recent Labs    11/16/17 1256  PROT 7.1   HEMOGLOBIN A1C No components found for: HGA1C,  MPG CARDIAC ENZYMES Lab Results  Component Value Date   TROPONINI <0.03 11/18/2017   BNP No results for input(s): PROBNP in the last 8760 hours. TSH Recent Labs    11/16/17 1803  TSH 1.481   CHOLESTEROL No results for input(s): CHOL in the last 8760 hours.  Scheduled Meds: . aspirin EC  81 mg Oral Daily  . diltiazem  120 mg Oral BID  . levofloxacin  500 mg Oral Q1500  . metoprolol tartrate  100 mg Oral BID  . sodium chloride flush  3 mL Intravenous Q12H   Continuous Infusions: .  sodium chloride     PRN Meds:.sodium chloride, acetaminophen **OR** acetaminophen, hydrALAZINE, HYDROcodone-acetaminophen, lidocaine (PF), menthol-cetylpyridinium, ondansetron **OR** ondansetron (ZOFRAN) IV, sodium chloride flush  Assessment/Plan: Left lung pneumonia Bilateral pleural effusion Paroxysma; atrial fibrillation/flutter Hypertension Sinus tachycardia  Increase diltiazem to 120 mg. Twice daily. Increase activity as tolerated.    LOS: 6 days    Orpah Cobb  MD  11/22/2017, 12:15 PM

## 2017-11-23 ENCOUNTER — Encounter (HOSPITAL_COMMUNITY): Payer: Self-pay | Admitting: Radiology

## 2017-11-23 ENCOUNTER — Inpatient Hospital Stay (HOSPITAL_COMMUNITY): Payer: Self-pay

## 2017-11-23 DIAGNOSIS — J9601 Acute respiratory failure with hypoxia: Secondary | ICD-10-CM

## 2017-11-23 DIAGNOSIS — Z9889 Other specified postprocedural states: Secondary | ICD-10-CM

## 2017-11-23 DIAGNOSIS — R0602 Shortness of breath: Secondary | ICD-10-CM

## 2017-11-23 DIAGNOSIS — I10 Essential (primary) hypertension: Secondary | ICD-10-CM

## 2017-11-23 LAB — CULTURE, BODY FLUID W GRAM STAIN -BOTTLE: Culture: NO GROWTH

## 2017-11-23 LAB — RETICULOCYTES
RBC.: 4.81 MIL/uL (ref 4.22–5.81)
RETIC COUNT ABSOLUTE: 38.5 10*3/uL (ref 19.0–186.0)
Retic Ct Pct: 0.8 % (ref 0.4–3.1)

## 2017-11-23 LAB — CBC
HCT: 36.9 % — ABNORMAL LOW (ref 39.0–52.0)
Hemoglobin: 12.1 g/dL — ABNORMAL LOW (ref 13.0–17.0)
MCH: 25.2 pg — AB (ref 26.0–34.0)
MCHC: 32.8 g/dL (ref 30.0–36.0)
MCV: 76.7 fL — ABNORMAL LOW (ref 78.0–100.0)
PLATELETS: 485 10*3/uL — AB (ref 150–400)
RBC: 4.81 MIL/uL (ref 4.22–5.81)
RDW: 13.1 % (ref 11.5–15.5)
WBC: 15.2 10*3/uL — ABNORMAL HIGH (ref 4.0–10.5)

## 2017-11-23 LAB — IRON AND TIBC
IRON: 13 ug/dL — AB (ref 45–182)
Saturation Ratios: 7 % — ABNORMAL LOW (ref 17.9–39.5)
TIBC: 179 ug/dL — AB (ref 250–450)
UIBC: 166 ug/dL

## 2017-11-23 LAB — FERRITIN: Ferritin: 546 ng/mL — ABNORMAL HIGH (ref 24–336)

## 2017-11-23 LAB — CULTURE, BODY FLUID-BOTTLE

## 2017-11-23 LAB — STREP PNEUMONIAE URINARY ANTIGEN: Strep Pneumo Urinary Antigen: NEGATIVE

## 2017-11-23 MED ORDER — DEXTROSE 5 % IV SOLN
1.0000 g | Freq: Every day | INTRAVENOUS | Status: DC
  Administered 2017-11-23 – 2017-11-27 (×5): 1 g via INTRAVENOUS
  Filled 2017-11-23 (×7): qty 10

## 2017-11-23 MED ORDER — TUBERCULIN PPD 5 UNIT/0.1ML ID SOLN
5.0000 [IU] | Freq: Once | INTRADERMAL | Status: AC
  Administered 2017-11-24: 5 [IU] via INTRADERMAL
  Filled 2017-11-23 (×2): qty 0.1

## 2017-11-23 MED ORDER — ACETAMINOPHEN 325 MG PO TABS
650.0000 mg | ORAL_TABLET | Freq: Four times a day (QID) | ORAL | Status: DC | PRN
  Administered 2017-11-30 – 2017-12-04 (×2): 650 mg via ORAL
  Filled 2017-11-23 (×2): qty 2

## 2017-11-23 MED ORDER — ACETAMINOPHEN 325 MG PO TABS: 650.0000 mg | ORAL_TABLET | Freq: Four times a day (QID) | ORAL | Status: DC | PRN

## 2017-11-23 MED ORDER — ACETAMINOPHEN 650 MG RE SUPP: 650.0000 mg | Freq: Four times a day (QID) | RECTAL | Status: DC | PRN

## 2017-11-23 MED ORDER — IOPAMIDOL (ISOVUE-300) INJECTION 61%
INTRAVENOUS | Status: AC
  Administered 2017-11-23: 75 mL
  Filled 2017-11-23: qty 75

## 2017-11-23 MED ORDER — DEXTROSE 5 % IV SOLN
500.0000 mg | INTRAVENOUS | Status: DC
  Administered 2017-11-23 – 2017-11-24 (×2): 500 mg via INTRAVENOUS
  Filled 2017-11-23 (×3): qty 500

## 2017-11-23 NOTE — Progress Notes (Signed)
PROGRESS NOTE  Scott Rowe  BSW:[id number] DOB: 1971/06/18 DOA: 11/16/2017 PCP: None Brief Narrative: Scott Rowe is a 47 y.o. male with a history of HTN not on medications and asthma who presented to the ED with dyspnea, chest pain and productive cough. CT chest demonstrated L > R pleural effusions with compressive atelectasis vs. infiltrate with nonspecific enlarged LNs. He was hypoxic, started on levaquin and supplemental oxygen. Left thoracentesis of 1.4L on 1/23 showed exudate and repeat CTA ruled out PE. No definite mass noted. Cultures have remained negative to date. Cytology from effusion was negative.   Assessment & Plan: Principal Problem:   PNA (pneumonia) Active Problems:   Acute respiratory failure with hypoxia (HCC)   Hypertension   Pleural effusion   S/P thoracentesis  Acute hypoxic respiratory failure: due to pneumonia, atelectasis, pleural effusions. BNP wnl. Echo with EF 65-70%, normal wall motion and diastolic function. Normal RV. IVC normal caliber.   - Completed levaquin x7 days. With ongoing leukocytosis and fever, pulm started CTX/azithro 1/28 - Blood cultures neg, pleural fluid Cx and gram stain negative.   Exudative pleural effusions: L > R, left thoracentesis 1/23, negative cytology. WBC count not very consistent with parapneumonic effusion. Autoimmune work up (RF weakly positive at 15.8, ANA neg, ESR grossly elevated at 70), quantiferon pending (empiric airborne precautions).  - Reaccumulation suspected based on CXR and exam, repeating CT today.  - Verified with lab that [place] would result 1/29.  Paroxysmal atrial flutter: Echocardiogram shows normal atria. TSH 1.481.  - Started ASA for CHA2DS2-VASc of 1 (HTN). - Increased metoprolol to 112m po BID, cardiology has titrated diltiazem.   HTN:  - Started metoprolol, diltiazem.  - Continue IV hydralazine prn severe range HTN.   Pericardial effusion: Small, not hemodynamically significant. No rub.  - Follow  up CT.  DVT prophylaxis: SCDs Code Status: Full Family Communication: None at bedside Disposition Plan: Uncertain, pending further work up.   Consultants:   Pulmonology  Cardiology  Procedures:   Echocardiogram 11/18/2017: - Left ventricle: The cavity size was normal. Wall thickness was   increased in a pattern of mild LVH. Systolic function was   vigorous. The estimated ejection fraction was in the range of 65%   to 70%. Wall motion was normal; there were no regional wall   motion abnormalities. Left ventricular diastolic function   parameters were normal. - Pericardium, extracardiac: A trivial pericardial effusion was   identified posterior to the heart. There was no evidence of   hemodynamic compromise.  U/S-guided left thoracentesis 1.4L amber fluid 11/18/2017  Antimicrobials:  Levaquin 1/21 - 1/27  Ceftriaxone 1/28 >>   Azithromycin 1/28 >>     Subjective: Dyspnea worsening, had fever this AM. Chest pain is decreased but remains.   Objective: Vitals:   11/22/17 2018 11/23/17 0417 11/23/17 0732 11/23/17 0904  BP: (!) 126/91 (!) 131/94 (!) 133/99 (!) 116/91  Pulse: 68 98 (!) 114 94  Resp: (!) 24 19 18    Temp: 99.5 F (37.5 C) (!) 100.6 F (38.1 C) 100.3 F (37.9 C)   TempSrc: Oral Oral Oral   SpO2: 94% 95% 95%   Weight:      Height:       Gen: 47y.o. male in no distress walking in room HEENT: Eyes normal. Pulm: Nonlabored tachypnea with progressively diminished breath sounds at L > R base CV: Regular tachycardia. No murmur, rub, or gallop. No JVD, no pedal edema. GI: Abdomen soft, non-tender, non-distended, with normoactive bowel sounds.  No organomegaly or masses felt. Ext: Warm, no deformities/arthralgias Skin: No rashes or wounds. Neuro: Alert and oriented. No focal neurological deficits. Psych: Judgement and insight appear normal. Mood & affect appropriate.   CBC: Recent Labs  Lab 11/19/17 0503 11/20/17 0424 11/21/17 0359 11/22/17 0311  11/23/17 0833  WBC 15.1* 15.0* 14.2* 14.2* 15.2*  HGB 11.3* 12.0* 11.7* 11.4* 12.1*  HCT 35.7* 36.9* 35.8* 34.9* 36.9*  MCV 78.3 77.5* 77.3* 77.0* 76.7*  PLT 413* 398 424* 427* 546*   Basic Metabolic Panel: Recent Labs  Lab 11/17/17 0428 11/19/17 0503  NA 136 136  K 4.0 3.9  CL 101 103  CO2 20* 23  GLUCOSE 99 101*  BUN 10 14  CREATININE 0.92 0.99  CALCIUM 8.5* 8.3*   GFR: Estimated Creatinine Clearance: 103.6 mL/min (by C-G formula based on SCr of 0.99 mg/dL). Liver Function Tests: No results for input(s): AST, ALT, ALKPHOS, BILITOT, PROT, ALBUMIN in the last 168 hours. Cardiac Enzymes: Recent Labs  Lab 11/18/17 1409  TROPONINI <0.03    Recent Results (from the past 240 hour(s))  Culture, blood (routine x 2)     Status: None   Collection Time: 11/16/17  1:25 PM  Result Value Ref Range Status   Specimen Description BLOOD RIGHT ANTECUBITAL  Final   Special Requests   Final    BOTTLES DRAWN AEROBIC AND ANAEROBIC Blood Culture adequate volume   Culture NO GROWTH 5 DAYS  Final   Report Status 11/21/2017 FINAL  Final  Culture, blood (routine x 2)     Status: None   Collection Time: 11/16/17  3:09 PM  Result Value Ref Range Status   Specimen Description BLOOD LEFT ANTECUBITAL  Final   Special Requests   Final    BOTTLES DRAWN AEROBIC AND ANAEROBIC Blood Culture adequate volume   Culture NO GROWTH 5 DAYS  Final   Report Status 11/21/2017 FINAL  Final  Gram stain     Status: None   Collection Time: 11/18/17 12:48 PM  Result Value Ref Range Status   Specimen Description PLEURAL LEFT  Final   Special Requests NONE  Final   Gram Stain   Final    FEW WBC PRESENT, PREDOMINANTLY MONONUCLEAR NO ORGANISMS SEEN    Report Status 11/18/2017 FINAL  Final  Culture, body fluid-bottle     Status: None (Preliminary result)   Collection Time: 11/18/17 12:48 PM  Result Value Ref Range Status   Specimen Description PLEURAL LEFT  Final   Special Requests NONE  Final   Culture NO  GROWTH 4 DAYS  Final   Report Status PENDING  Incomplete      Radiology Studies: Dg Chest Port 1 View  Result Date: 11/23/2017 CLINICAL DATA:  47 year old male with shortness of breath. Status post ultrasound-guided thoracentesis on 11/18/2017 large left pleural effusion. Negative cytology and cultures of the left pleural fluid. EXAM: PORTABLE CHEST 1 VIEW COMPARISON:  11/21/2017 FINDINGS: Portable AP semi upright view at 0642 hrs. Stable lung volumes. Stable mediastinal contours. Visualized tracheal air column is within normal limits. No pneumothorax. Increased dense left lung base opacity since the post thoracentesis image on 11/18/2017. Stable obscuration of the left hemidiaphragm since 11/21/2017. The right lung remains clear. Paucity of bowel gas in the upper abdomen. IMPRESSION: 1. Increasing opacity at the left lung base since 11/18/2017 could reflect recurrent left pleural effusion and/or atelectasis versus consolidation. 2. Negative right lung. Electronically Signed   By: Genevie Ann M.D.   On: 11/23/2017 07:47  Time spent: 25 minutes.  Vance Gather, MD [facility] Pager [phone]  If 7PM-7AM, please contact night-coverage [email or web address] Password [email or web address] 11/23/2017, 2:01 PM

## 2017-11-23 NOTE — Plan of Care (Signed)
  Progressing Health Behavior/Discharge Planning: Ability to manage health-related needs will improve 11/23/2017 2126 - Progressing by Renelda MomWorley, Aireana Ryland L, RN Clinical Measurements: Ability to maintain clinical measurements within normal limits will improve 11/23/2017 2126 - Progressing by Renelda MomWorley, Dylon Correa L, RN Will remain free from infection 11/23/2017 2126 - Progressing by Renelda MomWorley, [place]ena Bellemare L, RN Diagnostic test results will improve 11/23/2017 2126 - Progressing by Renelda MomWorley, Aemilia Dedrick L, RN Respiratory complications will improve 11/23/2017 2126 - Progressing by Renelda MomWorley, Meryle Pugmire L, RN

## 2017-11-23 NOTE — Progress Notes (Signed)
Name: Scott Rowe MRN: [id number] DOB: 05-14-1971    ADMISSION DATE:  11/16/2017 CONSULTATION DATE: November 19, 2017  REFERRING MD : Hospitalist service  CHIEF COMPLAINT: Shortness of breath cough  BRIEF PATIENT DESCRIPTION: Large left-sided pleural effusion could be pleural parapneumonic workup for the fluid still pending adenosine deaminase is pending is on airborne precautions possibly TB. Had fever last night still coughing still short of breath  SIGNIFICANT EVENTS  Still coughing still short of breath had fever last night STUDIES:  Chest x-ray with reexamination of fluid were repeat CAT scan I did send the eminence of triglycerides at that the cytology has been negative.  HISTORY OF PRESENT ILLNESS: 47 y/o M admitted 1/21 with complaints of chest pain and shortness of breath x 4 weeks.    He reports he began having progressive shortness of breath that started about 4 weeks ago.  He was unable to lie flat or stand up straight.  He had to lean over for comfort.  He states he later developed chest pain that went through into his back and also had an associated dry cough.  He began using [employer] which helped with the pain but stated he "googled" it and read it can be bad for your kidneys.  He denied known fevers, chills, n/v.  He states he never has smoked.  Is originally from [place].  Has never had a PPD.  He denies falls / chest injury.  He denies night sweats, weight loss, hemoptysis, TB exposures (known).    ER evaluation revealed a CXR which demonstrated a large left pleural effusion.  The patient was admitted for evaluation of pleural effusion.  He was treated empirically for PNA with levaquin. HIV assessed and negative. ECHO 1/23 showed mild LVH, LVEF 65-70%, normal wall motion, trivial pericardial effusion.  The patient underwent left thoracentesis on 1/24 with 1.4L of amber pleural fluid removed.  Post thora, he was taken for a CTA of the chest which showed significant  reduction of effusion, no mass, mild non-specific mediastinal lymphadenopathy and GGO on the left thought to be mild re-expansion pulmonary edema.     PCCM consulted for evaluation.     PAST MEDICAL HISTORY :   has a past medical history of Hypertension.  has a past surgical history that includes IR THORACENTESIS ASP PLEURAL SPACE W/IMG GUIDE (11/18/2017). Prior to Admission medications   Medication Sig Start Date End Date Taking? Authorizing Provider  acetaminophen (TYLENOL) 500 MG tablet Take 500 mg by mouth every 6 (six) hours as needed for mild pain.   Yes [provider]  Aspirin-Acetaminophen-Caffeine (GOODY HEADACHE PO) Take 1 packet by mouth every 6 (six) hours as needed (pain).   Yes [provider]  vitamin C (ASCORBIC ACID) 500 MG tablet Take 500 mg by mouth daily.   Yes [provider]   No Known Allergies  FAMILY HISTORY:  family history is not on file. SOCIAL HISTORY:  reports that  has never smoked. he has never used smokeless tobacco. He reports that he does not drink alcohol or use drugs.  REVIEW OF SYSTEMS:   Constitutional: Negative for fever, chills, weight loss, malaise/fatigue and diaphoresis.  HENT: Negative for hearing loss, ear pain, nosebleeds, congestion, sore throat, neck pain, tinnitus and ear discharge.   Eyes: Negative for blurred vision, double vision, photophobia, pain, discharge and redness.  Respiratory: Negative for cough, hemoptysis, sputum production, shortness of breath, wheezing and stridor.   Cardiovascular: Negative for chest pain, palpitations, orthopnea, claudication,  leg swelling and PND.  Gastrointestinal: Negative for heartburn, nausea, vomiting, abdominal pain, diarrhea, constipation, blood in stool and melena.  Genitourinary: Negative for dysuria, urgency, frequency, hematuria and flank pain.  Musculoskeletal: Negative for myalgias, back pain, joint pain and falls.  Skin: Negative for itching and rash.    Neurological: Negative for dizziness, tingling, tremors, sensory change, speech change, focal weakness, seizures, loss of consciousness, weakness and headaches.  Endo/Heme/Allergies: Negative for environmental allergies and polydipsia. Does not bruise/bleed easily.  SUBJECTIVE:   VITAL SIGNS: Temp:  [99.5 F (37.5 C)-100.6 F (38.1 C)] 100.3 F (37.9 C) (01/28 0732) Pulse Rate:  [68-114] 94 (01/28 0904) Resp:  [18-28] 18 (01/28 0732) BP: (116-133)/(91-99) 116/91 (01/28 0904) SpO2:  [94 %-95 %] 95 % (01/28 0732)  PHYSICAL EXAMINATION: General: young adult male in NAD, lying in bed HEENT: MM pink/moist, no jvd PSY: calm /pleasant  Neuro: AAOx4, speech clear  CV: s1s2 rrr, no m/r/g PULM: even/non-labored, lungs bilaterally clear anterior, diminished lower posterior  UJ:WJXBGI:soft, non-tender, bsx4 active  Extremities: warm/dry, no edema  Skin: no rashes or lesions    Recent Labs  Lab 11/16/17 1108 11/17/17 0428 11/19/17 0503  NA 137 136 136  K 3.9 4.0 3.9  CL 103 101 103  CO2 23 20* 23  BUN 10 10 14   CREATININE 1.17 0.92 0.99  GLUCOSE 108* 99 101*   Recent Labs  Lab 11/21/17 0359 11/22/17 0311 11/23/17 0833  HGB 11.7* 11.4* 12.1*  HCT 35.8* 34.9* 36.9*  WBC 14.2* 14.2* 15.2*  PLT 424* 427* 485*   Dg Chest Port 1 View  Result Date: 11/23/2017 CLINICAL DATA:  47 year old male with shortness of breath. Status post ultrasound-guided thoracentesis on 11/18/2017 large left pleural effusion. Negative cytology and cultures of the left pleural fluid. EXAM: PORTABLE CHEST 1 VIEW COMPARISON:  11/21/2017 FINDINGS: Portable AP semi upright view at 0642 hrs. Stable lung volumes. Stable mediastinal contours. Visualized tracheal air column is within normal limits. No pneumothorax. Increased dense left lung base opacity since the post thoracentesis image on 11/18/2017. Stable obscuration of the left hemidiaphragm since 11/21/2017. The right lung remains clear. Paucity of bowel gas in the  upper abdomen. IMPRESSION: 1. Increasing opacity at the left lung base since 11/18/2017 could reflect recurrent left pleural effusion and/or atelectasis versus consolidation. 2. Negative right lung. Electronically Signed   By: Odessa FlemingH  Hall M.D.   On: 11/23/2017 07:47   Dg Chest Portable 1 View  Result Date: 11/21/2017 CLINICAL DATA:  Pleural effusion. EXAM: PORTABLE CHEST 1 VIEW COMPARISON:  11/18/2017 FINDINGS: Heart size upper limits of normal. Left mid and lower lung bronchopneumonia remains evident. There are pleural effusions, larger on the left than the right. Right lung appears otherwise clear. IMPRESSION: Persistent pneumonia in the left mid and lower lung. Pleural effusions left larger than right. Electronically Signed   By: Paulina FusiMark  Shogry M.D.   On: 11/21/2017 15:17    ASSESSMENT / PLAN:  --Pleural effusion large status post status has been negative except date of triglycerides did not seem to administer the study. --Reaccumulation of pleural effusion repeat CAT scan to further characterize the pleural space after the type.   --Fever/shortness of breath we will try to induce sputum for culture for AFB will place PPD start him on community-acquired pneumonia coverage also placed on azithromycin since the gentleman Streptococcus pneumonia antigen in the urine. --We will follow with you thank you for this consultation.    Pulmonary and Critical Care Medicine [facility] Pager: [phone]  [phone]  11/23/2017, 10:43 AM

## 2017-11-23 NOTE — Progress Notes (Signed)
Subjective: Patient complains of pleuritic chest pain denies any anginal chest pain. Denies any palpitations. No further episodes of A. fib flutter on the monitor heart rate better controlled.  Objective:  Vital Signs in the last 24 hours: Temp:  [99.5 F (37.5 C)-100.6 F (38.1 C)] 100.3 F (37.9 C) (01/28 0732) Pulse Rate:  [68-114] 94 (01/28 0904) Resp:  [18-28] 18 (01/28 0732) BP: (116-133)/(91-99) 116/91 (01/28 0904) SpO2:  [94 %-95 %] 95 % (01/28 0732)  Intake/Output from previous day: 01/27 0701 - 01/28 0700 In: 480 [P.O.:480] Out: 2 [Urine:2] Intake/Output from this shift: No intake/output data recorded.  Physical Exam: Neck: no adenopathy, no carotid bruit, no JVD and supple, symmetrical, trachea midline Lungs: Decreased breath sounds left lung field Heart: regular rate and rhythm, S1, S2 normal and Soft systolic murmur noted no pericardial rub Abdomen: soft, non-tender; bowel sounds normal; no masses,  no organomegaly Extremities: extremities normal, atraumatic, no cyanosis or edema  Lab Results: Recent Labs    11/22/17 0311 11/23/17 0833  WBC 14.2* 15.2*  HGB 11.4* 12.1*  PLT 427* 485*   No results for input(s): NA, K, CL, CO2, GLUCOSE, BUN, CREATININE in the last 72 hours. No results for input(s): TROPONINI in the last 72 hours.  Invalid input(s): CK, MB Hepatic Function Panel No results for input(s): PROT, ALBUMIN, AST, ALT, ALKPHOS, BILITOT, BILIDIR, IBILI in the last 72 hours. No results for input(s): CHOL in the last 72 hours. No results for input(s): PROTIME in the last 72 hours.  Imaging: Imaging results have been reviewed and Dg Chest Port 1 View  Result Date: 11/23/2017 CLINICAL DATA:  47 year old male with shortness of breath. Status post ultrasound-guided thoracentesis on 11/18/2017 large left pleural effusion. Negative cytology and cultures of the left pleural fluid. EXAM: PORTABLE CHEST 1 VIEW COMPARISON:  11/21/2017 FINDINGS: Portable AP semi  upright view at 0642 hrs. Stable lung volumes. Stable mediastinal contours. Visualized tracheal air column is within normal limits. No pneumothorax. Increased dense left lung base opacity since the post thoracentesis image on 11/18/2017. Stable obscuration of the left hemidiaphragm since 11/21/2017. The right lung remains clear. Paucity of bowel gas in the upper abdomen. IMPRESSION: 1. Increasing opacity at the left lung base since 11/18/2017 could reflect recurrent left pleural effusion and/or atelectasis versus consolidation. 2. Negative right lung. Electronically Signed   By: Odessa FlemingH  Hall M.D.   On: 11/23/2017 07:47    Cardiac Studies:  Assessment/Plan:  Left PNA with exudative effusion/mediastinal lymphadenopathy rule out TB Recurrent left pleural effusion.  Status post paroxysmal A. fib flutter chadsvascscore of 1 secondary to above no evidence of pericarditis. Hypertension  Plan Continue present management her PMDs/pulmonary Check cultures No active cardiac issues at present I will sign off please call if needed   LOS: 7 days    Rinaldo CloudHarwani, Scott Rowe 11/23/2017, 4:23 PM

## 2017-11-24 ENCOUNTER — Inpatient Hospital Stay (HOSPITAL_COMMUNITY): Payer: Self-pay

## 2017-11-24 ENCOUNTER — Encounter (HOSPITAL_COMMUNITY): Payer: Self-pay | Admitting: Student

## 2017-11-24 HISTORY — PX: IR THORACENTESIS ASP PLEURAL SPACE W/IMG GUIDE: IMG5380

## 2017-11-24 LAB — BODY FLUID CELL COUNT WITH DIFFERENTIAL
Lymphs, Fluid: 16 %
Monocyte-Macrophage-Serous Fluid: 15 % — ABNORMAL LOW (ref 50–90)
Neutrophil Count, Fluid: 69 % — ABNORMAL HIGH (ref 0–25)
Total Nucleated Cell Count, Fluid: 2850 cu mm — ABNORMAL HIGH (ref 0–1000)

## 2017-11-24 LAB — LACTATE DEHYDROGENASE, PLEURAL OR PERITONEAL FLUID: LD FL: 130 U/L — AB (ref 3–23)

## 2017-11-24 LAB — AMYLASE, PLEURAL OR PERITONEAL FLUID: Amylase, Fluid: 35 U/L

## 2017-11-24 LAB — GLUCOSE, PLEURAL OR PERITONEAL FLUID: GLUCOSE FL: 122 mg/dL

## 2017-11-24 MED ORDER — LIDOCAINE HCL 1 % IJ SOLN
INTRAMUSCULAR | Status: DC | PRN
  Administered 2017-11-24: 10 mL

## 2017-11-24 MED ORDER — LIDOCAINE 2% (20 MG/ML) 5 ML SYRINGE
INTRAMUSCULAR | Status: AC
  Filled 2017-11-24: qty 10

## 2017-11-24 NOTE — Progress Notes (Signed)
Name: Scott Rowe MRN: [id number] DOB: 04/12/1971    ADMISSION DATE:  11/16/2017 CONSULTATION DATE: November 19, 2017  REFERRING MD : Hospitalist service  CHIEF COMPLAINT: Shortness of breath cough  BRIEF PATIENT DESCRIPTION: Large left-sided pleural effusion could be pleural parapneumonic workup for the fluid still pending adenosine deaminase is on airborne precautions possibly TB. Had fever last night still coughing still short of breath  SIGNIFICANT EVENTS  T max 100.3 last 24 hours  STUDIES:  Chest x-ray with reexamination of fluid were repeat CAT scan I did send the eminence of triglycerides at that the cytology has been negative.  HISTORY OF PRESENT ILLNESS:  47 y/o M admitted 1/21 with complaints of chest pain and shortness of breath x 4 weeks.    He reports he began having progressive shortness of breath that started about 4 weeks ago.  He was unable to lie flat or stand up straight.  He had to lean over for comfort.  He states he later developed chest pain that went through into his back and also had an associated dry cough.  He began using [employer] which helped with the pain but stated he "googled" it and read it can be bad for your kidneys.  He denied known fevers, chills, n/v.  He states he never has smoked.  Is originally from [place].  Has never had a PPD.  He denies falls / chest injury.  He denies night sweats, weight loss, hemoptysis, TB exposures (known).    ER evaluation revealed a CXR which demonstrated a large left pleural effusion. The patient was admitted for evaluation of pleural effusion.  He was treated empirically for PNA with levaquin. HIV assessed and negative. ECHO 1/23 showed mild LVH, LVEF 65-70%, normal wall motion, trivial pericardial effusion.  The patient underwent left thoracentesis on 1/24 with 1.4L of amber pleural fluid removed.  Post thora, he was taken for a CTA of the chest which showed significant reduction of effusion, no mass, mild  non-specific mediastinal lymphadenopathy and GGO on the left thought to be mild re-expansion pulmonary edema.     PCCM consulted for evaluation.     SUBJECTIVE: Supine in bed, Awake and alert, on RA with SATS of  96%, in NAD  VITAL SIGNS: Temp:  [98.2 F (36.8 C)-98.7 F (37.1 C)] 98.2 F (36.8 C) (01/29 0702) Pulse Rate:  [91-101] 91 (01/29 0702) Resp:  [11-32] 19 (01/29 0702) BP: (119-131)/(88-97) 131/88 (01/29 0702) SpO2:  [92 %-99 %] 99 % (01/29 0702)  PHYSICAL EXAMINATION: General:  adult male in NAD, lying in bed, appropriate, on RA HEENT: MM pink/moist, no jvd PSY: calm /pleasant , wants to know plan for repeat Thora Neuro: A& O x 4, speech clear  CV: s1s2 rrr, no m/r/g PULM: even/non-labored, lungs bilaterally clear anterior, diminished per bases L>R  XL:KGMWGI:soft, non-tender, bsx4 active, obese Extremities: warm/dry, no edema  Skin: no rashes or lesions, warm dry and intact    Recent Labs  Lab 11/19/17 0503  NA 136  K 3.9  CL 103  CO2 23  BUN 14  CREATININE 0.99  GLUCOSE 101*   Recent Labs  Lab 11/21/17 0359 11/22/17 0311 11/23/17 0833  HGB 11.7* 11.4* 12.1*  HCT 35.8* 34.9* 36.9*  WBC 14.2* 14.2* 15.2*  PLT 424* 427* 485*   Ct Chest W Contrast  Result Date: 11/23/2017 CLINICAL DATA:  Pleural effusion follow up EXAM: CT CHEST WITH CONTRAST TECHNIQUE: Multidetector CT imaging of the chest was performed during intravenous contrast  administration. CONTRAST:  75 cc ISOVUE-300 IOPAMIDOL (ISOVUE-300) INJECTION 61% COMPARISON:  CT chest dated 11/18/2017. chest CT dated 11/16/2017. FINDINGS: Cardiovascular: No thoracic aortic aneurysm or evidence of aortic dissection. Cardiomegaly. Small pericardial effusion, similar to previous exam. Mediastinum/Nodes: Stable mildly prominent lymph node within the right lower paratracheal space, measuring 12 mm short axis dimension. Additional stable mildly prominent lymph node within the subcarinal space, also measuring  approximately 12 mm short axis dimension. Additional scattered smaller lymph nodes within the mediastinum. No new lymphadenopathy. Esophagus appears normal.  Trachea appears normal. Lungs/Pleura: Moderate-sized left pleural effusion, with adjacent compressive atelectasis, increased compared to the chest CT of 11/18/2017, less than the pleural effusion that was seen on 11/16/2017. Small right pleural effusion with adjacent compressive atelectasis. Lungs otherwise clear.  No pneumothorax. Upper Abdomen: No acute findings within the upper abdomen. Musculoskeletal: No acute or suspicious osseous finding. IMPRESSION: 1. Moderate-sized left pleural effusion, with adjacent compressive atelectasis. The pleural effusion has reaccumulated since chest CT of 11/18/2017 but is not as large as the pleural effusion seen on earlier chest CT of 11/16/2017. 2. Small right pleural effusion, similar to previous exams. 3. Small pericardial effusion, similar to previous exam. Stable cardiomegaly. 4. Stable mild lymphadenopathy within the mediastinum, as detailed above. Electronically Signed   By: Bary Richard M.D.   On: 11/23/2017 23:38   Dg Chest Port 1 View  Result Date: 11/23/2017 CLINICAL DATA:  47 year old male with shortness of breath. Status post ultrasound-guided thoracentesis on 11/18/2017 large left pleural effusion. Negative cytology and cultures of the left pleural fluid. EXAM: PORTABLE CHEST 1 VIEW COMPARISON:  11/21/2017 FINDINGS: Portable AP semi upright view at 0642 hrs. Stable lung volumes. Stable mediastinal contours. Visualized tracheal air column is within normal limits. No pneumothorax. Increased dense left lung base opacity since the post thoracentesis image on 11/18/2017. Stable obscuration of the left hemidiaphragm since 11/21/2017. The right lung remains clear. Paucity of bowel gas in the upper abdomen. IMPRESSION: 1. Increasing opacity at the left lung base since 11/18/2017 could reflect recurrent left  pleural effusion and/or atelectasis versus consolidation. 2. Negative right lung. Electronically Signed   By: Odessa Fleming M.D.   On: 11/23/2017 07:47   CT Chest 1/28 Moderate-sized left pleural effusion, adjacent compressive atelectasis. The pleural effusion has reaccumulated since chest CT of 11/18/2017 but is not as large as the pleural effusion  Small right pleural effusion, similar to previous exams. Small pericardial effusion, similar to previous exam. Stable cardiomegaly. Stable mild lymphadenopathy within the mediastinum, as detailed above.   ASSESSMENT / PLAN:  --Pleural effusion large >> status post thoracentesis cytology has been negative   --Reaccumulation of pleural effusion per CXR and CT 1/28 --Fever/shortness of breath  - Repeat CT Chest 1/28 Plan: - CT Chest repeated 1/28>> see above - IR for US guided thoracentesis ordered  1/29 - Fluid for cytology/ labs  including Triglycerides and adenosine deaminase to the fluid - sputum for culture / AFB - Follow Interferon Gold for result - PPD to RO TB - Continue  CAP coverage  - CXR 1/30 - Mobilize - IS Q 1 while awake - Aggressive pulmonary toilet - Titrate oxygen as needed to Maintain sats > 94% - PCCM will follow with you   Discussion: If PPD negative,Interferon Gold negative, may be able to follow and treat as outpatient per Dr. Charlesetta Ivory, AGACNP-BC Pulmonary and Critical Care Medicine [facility] Pager: [phone]  11/24/2017, 11:12 AM

## 2017-11-24 NOTE — Progress Notes (Signed)
Patient arrived to unit in wheelchair with 4E RN, patient asking about taking a shower and his test results. Patient anxious. Oriented to unit, safety measures in place.

## 2017-11-24 NOTE — Procedures (Signed)
PROCEDURE SUMMARY:  Successful US guided left diagnostic and therapeutic thoracentesis. Yielded 800 mL of cloudy, yellow fluid. Pt tolerated procedure well. No immediate complications.  Specimen was sent for labs. CXR ordered.  Hoyt KochKacie Sue-Ellen Florencio Hollibaugh PA-C 11/24/2017 2:22 PM

## 2017-11-24 NOTE — Progress Notes (Signed)
I have checked with the lab over the weekend regarding the quantiferon test sent on 1/24. I was told to expect results 1/28 or 1/29. After not seeing a result I called [facility] at [phone] [name]([name]) to inquire. I was told the assay was not actually set up until 1/27 for unclear reasons. The department for that test is [facility], which can be contacted directly at [phone] in the morning. They are no longer available this evening.   [name] [name], MD 11/24/2017 7:03 PM

## 2017-11-24 NOTE — Procedures (Addendum)
PROCEDURE SUMMARY:  Patient was brought to radiology department wearing only a blue mask.  Radiology staff was not informed patient was on airborne precautions before being transported.   Successful US guided left diagnostic and therapeutic thoracentesis. Yielded 800 mL of amber fluid. Pt tolerated procedure well. No immediate complications.  Specimen was sent for labs. CXR ordered.  Hoyt KochKacie Sue-Ellen Matthews PA-C 11/24/2017 4:04 PM

## 2017-11-24 NOTE — Progress Notes (Signed)
PROGRESS NOTE  Scott Rowe  AYT:[id number] DOB: Mar 16, 1971 DOA: 11/16/2017 PCP: None Brief Narrative: Scott Rowe is a 47 y.o. male with a history of HTN not on medications and asthma who presented to the ED with dyspnea, chest pain and productive cough. CT chest demonstrated L > R pleural effusions with compressive atelectasis vs. infiltrate with nonspecific enlarged LNs. He was hypoxic, started on levaquin and supplemental oxygen. Left thoracentesis of 1.4L on 1/23 showed exudate and repeat CTA ruled out PE. No definite mass noted. Cultures have remained negative to date. Cytology from effusion was negative. Thoracentesis repeated 1/29.   Assessment & Plan: Principal Problem:   PNA (pneumonia) Active Problems:   Acute respiratory failure with hypoxia (HCC)   Hypertension   Pleural effusion   S/P thoracentesis  Acute hypoxic respiratory failure: due to pneumonia, atelectasis, pleural effusions. BNP wnl. Echo with EF 65-70%, normal wall motion and diastolic function. Normal RV. IVC normal caliber.   - Completed levaquin x7 days. With ongoing leukocytosis and fever, pulm started CTX/azithro 1/28 - Follow up pulmonology recommendations. - Blood cultures neg, pleural fluid Cx and gram stain negative.   Exudative pleural effusions: L > R, left thoracentesis 1/23, negative cytology, repeated 1/29. WBC count not very consistent with parapneumonic effusion. Autoimmune work up (RF weakly positive at 15.8, ANA neg, ESR grossly elevated at 70), quantiferon pending (empiric airborne precautions).  - Follow up labs from repeat thoracentesis 1/29. - Verified with lab that [place] would result 1/29, will call pt with result as soon as this is available.   Paroxysmal atrial flutter: Echocardiogram shows normal atria. TSH 1.481.  - Started ASA for CHA2DS2-VASc of 1 (HTN). - Increased metoprolol to 111m po BID, cardiology has titrated diltiazem.   HTN:  - Started metoprolol, diltiazem with  improvement in rate and BP.  - Continue IV hydralazine prn severe range HTN.   Pericardial effusion: Small, not hemodynamically significant. No rub. Stable on serial CT's.  DVT prophylaxis: SCDs Code Status: Full Family Communication: None at bedside Disposition Plan: Uncertain, pending further work up.   Consultants:   Pulmonology  Cardiology  IR  Procedures:   Echocardiogram 11/18/2017: - Left ventricle: The cavity size was normal. Wall thickness was   increased in a pattern of mild LVH. Systolic function was   vigorous. The estimated ejection fraction was in the range of 65%   to 70%. Wall motion was normal; there were no regional wall   motion abnormalities. Left ventricular diastolic function   parameters were normal. - Pericardium, extracardiac: A trivial pericardial effusion was   identified posterior to the heart. There was no evidence of   hemodynamic compromise.  U/S-guided left thoracentesis 1.4L amber fluid 11/18/2017 U/S-guided left thoracentesis 800cc cloudy, yellow fluid 11/24/2017  Antimicrobials:  Levaquin 1/21 - 1/27  Ceftriaxone 1/28 >>   Azithromycin 1/28 >>     Subjective: Dyspnea stable from yesterday but still with anxiety about Dx. Chest pain is decreased but remains.   Objective: Vitals:   11/24/17 0702 11/24/17 1125 11/24/17 1300 11/24/17 1330  BP: 131/88  119/81 112/80  Pulse: 91 (!) 105    Resp: 19     Temp: 98.2 F (36.8 C) 98.7 F (37.1 C)    TempSrc: Oral Oral    SpO2: 99%     Weight:      Height:       Gen: 47y.o. male in no distress resting quietly HEENT: Eyes appear normal.  Pulm: Nonlabored with diminished left sided  breath sounds. No wheezing/crackles. CV: Regular mild tachycardia. No murmur, rub, or gallop. No JVD, no pedal edema. GI: Abdomen soft, non-tender, non-distended, with normoactive bowel sounds. No organomegaly or masses felt. Ext: Warm, no deformities/arthralgias Skin: No rashes or wounds. Bandaid c/d/i  over healing thora puncture site on left back. Neuro: Alert and oriented. No focal neurological deficits. Psych: Judgement and insight appear normal. Mood & affect appropriate.   CBC: Recent Labs  Lab 11/19/17 0503 11/20/17 0424 11/21/17 0359 11/22/17 0311 11/23/17 0833  WBC 15.1* 15.0* 14.2* 14.2* 15.2*  HGB 11.3* 12.0* 11.7* 11.4* 12.1*  HCT 35.7* 36.9* 35.8* 34.9* 36.9*  MCV 78.3 77.5* 77.3* 77.0* 76.7*  PLT 413* 398 424* 427* 409*   Basic Metabolic Panel: Recent Labs  Lab 11/19/17 0503  NA 136  K 3.9  CL 103  CO2 23  GLUCOSE 101*  BUN 14  CREATININE 0.99  CALCIUM 8.3*   GFR: Estimated Creatinine Clearance: 103.6 mL/min (by C-G formula based on SCr of 0.99 mg/dL). Liver Function Tests: No results for input(s): AST, ALT, ALKPHOS, BILITOT, PROT, ALBUMIN in the last 168 hours. Cardiac Enzymes: Recent Labs  Lab 11/18/17 1409  TROPONINI <0.03    Recent Results (from the past 240 hour(s))  Culture, blood (routine x 2)     Status: None   Collection Time: 11/16/17  1:25 PM  Result Value Ref Range Status   Specimen Description BLOOD RIGHT ANTECUBITAL  Final   Special Requests   Final    BOTTLES DRAWN AEROBIC AND ANAEROBIC Blood Culture adequate volume   Culture NO GROWTH 5 DAYS  Final   Report Status 11/21/2017 FINAL  Final  Culture, blood (routine x 2)     Status: None   Collection Time: 11/16/17  3:09 PM  Result Value Ref Range Status   Specimen Description BLOOD LEFT ANTECUBITAL  Final   Special Requests   Final    BOTTLES DRAWN AEROBIC AND ANAEROBIC Blood Culture adequate volume   Culture NO GROWTH 5 DAYS  Final   Report Status 11/21/2017 FINAL  Final  Gram stain     Status: None   Collection Time: 11/18/17 12:48 PM  Result Value Ref Range Status   Specimen Description PLEURAL LEFT  Final   Special Requests NONE  Final   Gram Stain   Final    FEW WBC PRESENT, PREDOMINANTLY MONONUCLEAR NO ORGANISMS SEEN    Report Status 11/18/2017 FINAL  Final    Culture, body fluid-bottle     Status: None   Collection Time: 11/18/17 12:48 PM  Result Value Ref Range Status   Specimen Description PLEURAL LEFT  Final   Special Requests NONE  Final   Culture NO GROWTH 5 DAYS  Final   Report Status 11/23/2017 FINAL  Final  Culture, expectorated sputum-assessment     Status: None (Preliminary result)   Collection Time: 11/23/17 10:40 AM  Result Value Ref Range Status   Specimen Description SPUTUM  Final   Special Requests Normal  Final   Sputum evaluation   Final    Sputum specimen not acceptable for testing.  Please recollect.   RESULT CALLED TO, READ BACK BY AND VERIFIED WITH: Chana Bode RN 11/24/17 0446 JDW    Report Status PENDING  Incomplete      Radiology Studies: Dg Chest 1 View  Result Date: 11/24/2017 CLINICAL DATA:  47 year old male with a history of recent left-sided thoracentesis EXAM: CHEST 1 VIEW COMPARISON:  Chest x-ray 11/23/2017, CT 11/23/2017 FINDINGS: Cardiomediastinal silhouette  unchanged. Improved aeration at the left base. Blunting of left costophrenic angle. No pneumothorax. No confluent airspace disease. IMPRESSION: Improved aeration at the left base with decreased pleural effusion and no evidence of pneumothorax. Electronically Signed   By: Corrie Mckusick D.O.   On: 11/24/2017 14:30   Ct Chest W Contrast  Result Date: 11/23/2017 CLINICAL DATA:  Pleural effusion follow up EXAM: CT CHEST WITH CONTRAST TECHNIQUE: Multidetector CT imaging of the chest was performed during intravenous contrast administration. CONTRAST:  75 cc ISOVUE-300 IOPAMIDOL (ISOVUE-300) INJECTION 61% COMPARISON:  CT chest dated 11/18/2017. chest CT dated 11/16/2017. FINDINGS: Cardiovascular: No thoracic aortic aneurysm or evidence of aortic dissection. Cardiomegaly. Small pericardial effusion, similar to previous exam. Mediastinum/Nodes: Stable mildly prominent lymph node within the right lower paratracheal space, measuring 12 mm short axis dimension. Additional  stable mildly prominent lymph node within the subcarinal space, also measuring approximately 12 mm short axis dimension. Additional scattered smaller lymph nodes within the mediastinum. No new lymphadenopathy. Esophagus appears normal.  Trachea appears normal. Lungs/Pleura: Moderate-sized left pleural effusion, with adjacent compressive atelectasis, increased compared to the chest CT of 11/18/2017, less than the pleural effusion that was seen on 11/16/2017. Small right pleural effusion with adjacent compressive atelectasis. Lungs otherwise clear.  No pneumothorax. Upper Abdomen: No acute findings within the upper abdomen. Musculoskeletal: No acute or suspicious osseous finding. IMPRESSION: 1. Moderate-sized left pleural effusion, with adjacent compressive atelectasis. The pleural effusion has reaccumulated since chest CT of 11/18/2017 but is not as large as the pleural effusion seen on earlier chest CT of 11/16/2017. 2. Small right pleural effusion, similar to previous exams. 3. Small pericardial effusion, similar to previous exam. Stable cardiomegaly. 4. Stable mild lymphadenopathy within the mediastinum, as detailed above. Electronically Signed   By: Franki Cabot M.D.   On: 11/23/2017 23:38   Dg Chest Port 1 View  Result Date: 11/23/2017 CLINICAL DATA:  47 year old male with shortness of breath. Status post ultrasound-guided thoracentesis on 11/18/2017 large left pleural effusion. Negative cytology and cultures of the left pleural fluid. EXAM: PORTABLE CHEST 1 VIEW COMPARISON:  11/21/2017 FINDINGS: Portable AP semi upright view at 0642 hrs. Stable lung volumes. Stable mediastinal contours. Visualized tracheal air column is within normal limits. No pneumothorax. Increased dense left lung base opacity since the post thoracentesis image on 11/18/2017. Stable obscuration of the left hemidiaphragm since 11/21/2017. The right lung remains clear. Paucity of bowel gas in the upper abdomen. IMPRESSION: 1. Increasing  opacity at the left lung base since 11/18/2017 could reflect recurrent left pleural effusion and/or atelectasis versus consolidation. 2. Negative right lung. Electronically Signed   By: Genevie Ann M.D.   On: 11/23/2017 07:47    Time spent: 25 minutes.  Vance Gather, MD [facility] Pager [phone]  If 7PM-7AM, please contact night-coverage [email or web address] Password [facility] 11/24/2017, 2:57 PM

## 2017-11-25 ENCOUNTER — Inpatient Hospital Stay (HOSPITAL_COMMUNITY): Payer: Self-pay

## 2017-11-25 LAB — PH, BODY FLUID: pH, Body Fluid: 7.5

## 2017-11-25 LAB — COMPREHENSIVE METABOLIC PANEL
ALBUMIN: 2.4 g/dL — AB (ref 3.5–5.0)
ALK PHOS: 170 U/L — AB (ref 38–126)
ALT: 107 U/L — ABNORMAL HIGH (ref 17–63)
ANION GAP: 11 (ref 5–15)
AST: 57 U/L — ABNORMAL HIGH (ref 15–41)
BUN: 15 mg/dL (ref 6–20)
CALCIUM: 8.3 mg/dL — AB (ref 8.9–10.3)
CO2: 25 mmol/L (ref 22–32)
Chloride: 100 mmol/L — ABNORMAL LOW (ref 101–111)
Creatinine, Ser: 0.99 mg/dL (ref 0.61–1.24)
GFR calc Af Amer: >60 mL/min (ref >60)
GFR calc non Af Amer: >60 mL/min (ref >60)
GLUCOSE: 107 mg/dL — AB (ref 65–99)
Potassium: 4.3 mmol/L (ref 3.5–5.1)
Sodium: 136 mmol/L (ref 135–145)
Total Bilirubin: 0.6 mg/dL (ref 0.3–1.2)
Total Protein: 7 g/dL (ref 6.5–8.1)

## 2017-11-25 LAB — CBC
HCT: 37.1 % — ABNORMAL LOW (ref 39.0–52.0)
HEMOGLOBIN: 12.1 g/dL — AB (ref 13.0–17.0)
MCH: 25.2 pg — ABNORMAL LOW (ref 26.0–34.0)
MCHC: 32.6 g/dL (ref 30.0–36.0)
MCV: 77.1 fL — ABNORMAL LOW (ref 78.0–100.0)
Platelets: 439 10*3/uL — ABNORMAL HIGH (ref 150–400)
RBC: 4.81 MIL/uL (ref 4.22–5.81)
RDW: 13.2 % (ref 11.5–15.5)
WBC: 12.7 10*3/uL — ABNORMAL HIGH (ref 4.0–10.5)

## 2017-11-25 LAB — TRIGLYCERIDES, BODY FLUIDS: TRIGLYCERIDES FL: 31 mg/dL

## 2017-11-25 LAB — QUANTIFERON-TB GOLD PLUS (RQFGPL)
QUANTIFERON NIL VALUE: 0.06 [IU]/mL
QUANTIFERON TB2 AG VALUE: 0.07 [IU]/mL
QuantiFERON Mitogen Value: 0.63 IU/mL
QuantiFERON TB1 Ag Value: 0.06 IU/mL

## 2017-11-25 LAB — GRAM STAIN

## 2017-11-25 LAB — ACID FAST SMEAR (AFB): ACID FAST SMEAR - AFSCU2: NEGATIVE

## 2017-11-25 LAB — PATHOLOGIST SMEAR REVIEW: Path Review: REACTIVE

## 2017-11-25 LAB — EXPECTORATED SPUTUM ASSESSMENT W GRAM STAIN, RFLX TO RESP C: Special Requests: NORMAL

## 2017-11-25 LAB — QUANTIFERON-TB GOLD PLUS: QuantiFERON-TB Gold Plus: NEGATIVE

## 2017-11-25 LAB — ACID FAST SMEAR (AFB, MYCOBACTERIA): Acid Fast Smear: NEGATIVE

## 2017-11-25 LAB — LEGIONELLA PNEUMOPHILA SEROGP 1 UR AG: L. pneumophila Serogp 1 Ur Ag: NEGATIVE

## 2017-11-25 LAB — EXPECTORATED SPUTUM ASSESSMENT W REFEX TO RESP CULTURE

## 2017-11-25 MED ORDER — OXYCODONE HCL 5 MG PO TABS
5.0000 mg | ORAL_TABLET | Freq: Four times a day (QID) | ORAL | Status: DC | PRN
  Administered 2017-11-27: 5 mg via ORAL
  Filled 2017-11-25: qty 1

## 2017-11-25 MED ORDER — OXYCODONE HCL 5 MG PO TABS
5.0000 mg | ORAL_TABLET | Freq: Four times a day (QID) | ORAL | Status: DC | PRN
Start: 2017-11-25 — End: 2017-11-25
  Administered 2017-11-25: 5 mg via ORAL
  Filled 2017-11-25: qty 1

## 2017-11-25 MED ORDER — AZITHROMYCIN 500 MG PO TABS
500.0000 mg | ORAL_TABLET | Freq: Every day | ORAL | Status: DC
  Administered 2017-11-25 – 2017-11-27 (×3): 500 mg via ORAL
  Filled 2017-11-25 (×3): qty 1

## 2017-11-25 MED ORDER — IBUPROFEN 600 MG PO TABS
800.0000 mg | ORAL_TABLET | Freq: Four times a day (QID) | ORAL | Status: DC | PRN
  Administered 2017-11-25 – 2017-11-30 (×6): 800 mg via ORAL
  Filled 2017-11-25 (×7): qty 1

## 2017-11-25 NOTE — Plan of Care (Signed)
  Health Behavior/Discharge Planning: Ability to manage health-related needs will improve 11/25/2017 2339 - Progressing by Jeanella Flatteryhomas, Maricella Filyaw T, RN   Clinical Measurements: Will remain free from infection 11/25/2017 2339 - Progressing by Jeanella Flatteryhomas, Taler Kushner T, RN

## 2017-11-25 NOTE — Patient Care Conference (Signed)
Call placed to pulmonology to verify that they would be rounding on patient today. Confirmed they will, but likely in the afternoon.

## 2017-11-25 NOTE — Plan of Care (Signed)
  Clinical Measurements: Respiratory complications will improve 11/25/2017 0116 - Progressing by Jeanella Flatteryhomas, Ellianne Gowen T, RN   Coping: Level of anxiety will decrease 11/25/2017 0116 - Progressing by Jeanella Flatteryhomas, Dayle Mcnerney T, RN

## 2017-11-25 NOTE — Progress Notes (Signed)
PROGRESS NOTE    Scott Rowe  HGD:[id number] DOB: Mar 05, 1971 DOA: 11/16/2017 PCP: Patient, No Pcp Per    Brief Narrative: Harper Smoker is a 47 y.o. male with a history of HTN not on medications and asthma who presented to the ED with dyspnea, chest pain and productive cough. CT chest demonstrated L > R pleural effusions with compressive atelectasis vs. infiltrate with nonspecific enlarged LNs. He was hypoxic, started on levaquin and supplemental oxygen. Left thoracentesis of 1.4L on 1/23 showed exudate and repeat CTA ruled out PE. No definite mass noted. Cultures have remained negative to date. Cytology from effusion was negative. Thoracentesis repeated 1/29.      Assessment & Plan:   Principal Problem:   PNA (pneumonia) Active Problems:   Acute respiratory failure with hypoxia (HCC)   Hypertension   Pleural effusion   S/P thoracentesis   1-Acute hypoxic Respiratory failure; secondary to PNA and pleural effusion.  IV antibiotics.  He has had two thoracentesis.  Pulmonary following.  Gold quantiferon negative,   2-Pleural effusion, left exudative.  He has had thoracentesis times 2.  Fluids, WBC 2850, gram stain negative, culture negative FAB pending.  Chest x ray today, with slightly recurrence of pleural effusion.  Pulmonary to round on patient today.   Autoimmune work up (RF weakly positive at 15.8, ANA neg, ESR grossly elevated at 70),  On IV ceftriaxone and azithromycin.   Transaminases;  Increasing. Change Vicodin to oxycodone. Avoid tylenol.  Check hepatitis panel.  Repeat labs in am.    Paroxysmal atrial flutter: Echocardiogram shows normal atria. TSH 1.481.  Started ASA for CHA2DS2-VASc of 1 (HTN). On metoprolol and Cardizem.   Pericardial effusion: Small, not hemodynamically significant. No rub. Stable on serial CT's.     DVT prophylaxis: SCD Code Status: full code.  Family Communication: care discussed with patient  Disposition Plan: to be  determine  Consultants:   Pulmonology   Procedures:  Thoracentesis.    Antimicrobials: ceftriaxone and azithromycin,      Subjective: Report improvement of dyspnea after thoracentesis.  Pain at site of thoracentesis.   Objective: Vitals:   11/24/17 2219 11/24/17 2359 11/25/17 0616 11/25/17 1100  BP: 127/88 123/84 (!) 127/91 (!) 139/94  Pulse: (!) 103 86 92 (!) 103  Resp:  18 20   Temp:   98.6 F (37 C)   TempSrc:   Oral   SpO2:  98% 98%   Weight:   87.7 kg (193 lb 4.8 oz)   Height:        Intake/Output Summary (Last 24 hours) at 11/25/2017 1320 Last data filed at 11/25/2017 0618 Gross per 24 hour  Intake 880 ml  Output 600 ml  Net 280 ml   Filed Weights   11/16/17 2236 11/24/17 2003 11/25/17 0616  Weight: 91.6 kg (201 lb 15.1 oz) 88.5 kg (195 lb 3.2 oz) 87.7 kg (193 lb 4.8 oz)    Examination:  General exam: Appears calm and comfortable  Respiratory system: Clear to auscultation. Respiratory effort normal. Cardiovascular system: S1 & S2 heard, RRR. No JVD, murmurs, rubs, gallops or clicks. No pedal edema. Gastrointestinal system: Abdomen is nondistended, soft and nontender. No organomegaly or masses felt. Normal bowel sounds heard. Central nervous system: Alert and oriented. No focal neurological deficits. Extremities: Symmetric 5 x 5 power. Skin: No rashes, lesions or ulcers Psychiatry: Judgement and insight appear normal. Mood & affect appropriate.     Data Reviewed: I have personally reviewed following labs and imaging studies  CBC:  Recent Labs  Lab 11/20/17 0424 11/21/17 0359 11/22/17 0311 11/23/17 0833 11/25/17 0517  WBC 15.0* 14.2* 14.2* 15.2* 12.7*  HGB 12.0* 11.7* 11.4* 12.1* 12.1*  HCT 36.9* 35.8* 34.9* 36.9* 37.1*  MCV 77.5* 77.3* 77.0* 76.7* 77.1*  PLT 398 424* 427* 485* 409*   Basic Metabolic Panel: Recent Labs  Lab 11/19/17 0503 11/25/17 0517  NA 136 136  K 3.9 4.3  CL 103 100*  CO2 23 25  GLUCOSE 101* 107*  BUN 14 15   CREATININE 0.99 0.99  CALCIUM 8.3* 8.3*   GFR: Estimated Creatinine Clearance: 101.4 mL/min (by C-G formula based on SCr of 0.99 mg/dL). Liver Function Tests: Recent Labs  Lab 11/25/17 0517  AST 57*  ALT 107*  ALKPHOS 170*  BILITOT 0.6  PROT 7.0  ALBUMIN 2.4*   No results for input(s): LIPASE, AMYLASE in the last 168 hours. No results for input(s): AMMONIA in the last 168 hours. Coagulation Profile: No results for input(s): INR, PROTIME in the last 168 hours. Cardiac Enzymes: Recent Labs  Lab 11/18/17 1409  TROPONINI <0.03   BNP (last 3 results) No results for input(s): PROBNP in the last 8760 hours. HbA1C: No results for input(s): HGBA1C in the last 72 hours. CBG: No results for input(s): GLUCAP in the last 168 hours. Lipid Profile: No results for input(s): CHOL, HDL, LDLCALC, TRIG, CHOLHDL, LDLDIRECT in the last 72 hours. Thyroid Function Tests: No results for input(s): TSH, T4TOTAL, FREET4, T3FREE, THYROIDAB in the last 72 hours. Anemia Panel: Recent Labs    11/23/17 0833  FERRITIN 546*  TIBC 179*  IRON 13*  RETICCTPCT 0.8   Sepsis Labs: No results for input(s): PROCALCITON, LATICACIDVEN in the last 168 hours.  Recent Results (from the past 240 hour(s))  Culture, blood (routine x 2)     Status: None   Collection Time: 11/16/17  1:25 PM  Result Value Ref Range Status   Specimen Description BLOOD RIGHT ANTECUBITAL  Final   Special Requests   Final    BOTTLES DRAWN AEROBIC AND ANAEROBIC Blood Culture adequate volume   Culture NO GROWTH 5 DAYS  Final   Report Status 11/21/2017 FINAL  Final  Culture, blood (routine x 2)     Status: None   Collection Time: 11/16/17  3:09 PM  Result Value Ref Range Status   Specimen Description BLOOD LEFT ANTECUBITAL  Final   Special Requests   Final    BOTTLES DRAWN AEROBIC AND ANAEROBIC Blood Culture adequate volume   Culture NO GROWTH 5 DAYS  Final   Report Status 11/21/2017 FINAL  Final  Gram stain     Status: None    Collection Time: 11/18/17 12:48 PM  Result Value Ref Range Status   Specimen Description PLEURAL LEFT  Final   Special Requests NONE  Final   Gram Stain   Final    FEW WBC PRESENT, PREDOMINANTLY MONONUCLEAR NO ORGANISMS SEEN    Report Status 11/18/2017 FINAL  Final  Culture, body fluid-bottle     Status: None   Collection Time: 11/18/17 12:48 PM  Result Value Ref Range Status   Specimen Description PLEURAL LEFT  Final   Special Requests NONE  Final   Culture NO GROWTH 5 DAYS  Final   Report Status 11/23/2017 FINAL  Final  Culture, expectorated sputum-assessment     Status: None (Preliminary result)   Collection Time: 11/23/17 10:40 AM  Result Value Ref Range Status   Specimen Description SPUTUM  Final   Special Requests  Normal  Final   Sputum evaluation   Final    Sputum specimen not acceptable for testing.  Please recollect.   RESULT CALLED TO, READ BACK BY AND VERIFIED WITH: Chana Bode RN 11/24/17 0446 JDW    Report Status PENDING  Incomplete  Gram stain     Status: None   Collection Time: 11/24/17 12:29 AM  Result Value Ref Range Status   Specimen Description FLUID PLEURAL LEFT  Final   Special Requests NONE  Final   Gram Stain   Final    FEW WBC PRESENT, PREDOMINANTLY PMN NO ORGANISMS SEEN    Report Status 11/25/2017 FINAL  Final         Radiology Studies: Dg Chest 1 View  Result Date: 11/24/2017 CLINICAL DATA:  47 year old male with a history of recent left-sided thoracentesis EXAM: CHEST 1 VIEW COMPARISON:  Chest x-ray 11/23/2017, CT 11/23/2017 FINDINGS: Cardiomediastinal silhouette unchanged. Improved aeration at the left base. Blunting of left costophrenic angle. No pneumothorax. No confluent airspace disease. IMPRESSION: Improved aeration at the left base with decreased pleural effusion and no evidence of pneumothorax. Electronically Signed   By: Corrie Mckusick D.O.   On: 11/24/2017 14:30   Ct Chest W Contrast  Result Date: 11/23/2017 CLINICAL DATA:  Pleural  effusion follow up EXAM: CT CHEST WITH CONTRAST TECHNIQUE: Multidetector CT imaging of the chest was performed during intravenous contrast administration. CONTRAST:  75 cc ISOVUE-300 IOPAMIDOL (ISOVUE-300) INJECTION 61% COMPARISON:  CT chest dated 11/18/2017. chest CT dated 11/16/2017. FINDINGS: Cardiovascular: No thoracic aortic aneurysm or evidence of aortic dissection. Cardiomegaly. Small pericardial effusion, similar to previous exam. Mediastinum/Nodes: Stable mildly prominent lymph node within the right lower paratracheal space, measuring 12 mm short axis dimension. Additional stable mildly prominent lymph node within the subcarinal space, also measuring approximately 12 mm short axis dimension. Additional scattered smaller lymph nodes within the mediastinum. No new lymphadenopathy. Esophagus appears normal.  Trachea appears normal. Lungs/Pleura: Moderate-sized left pleural effusion, with adjacent compressive atelectasis, increased compared to the chest CT of 11/18/2017, less than the pleural effusion that was seen on 11/16/2017. Small right pleural effusion with adjacent compressive atelectasis. Lungs otherwise clear.  No pneumothorax. Upper Abdomen: No acute findings within the upper abdomen. Musculoskeletal: No acute or suspicious osseous finding. IMPRESSION: 1. Moderate-sized left pleural effusion, with adjacent compressive atelectasis. The pleural effusion has reaccumulated since chest CT of 11/18/2017 but is not as large as the pleural effusion seen on earlier chest CT of 11/16/2017. 2. Small right pleural effusion, similar to previous exams. 3. Small pericardial effusion, similar to previous exam. Stable cardiomegaly. 4. Stable mild lymphadenopathy within the mediastinum, as detailed above. Electronically Signed   By: Franki Cabot M.D.   On: 11/23/2017 23:38   Dg Chest Port 1 View  Result Date: 11/25/2017 CLINICAL DATA:  Left-sided thoracentesis yesterday EXAM: PORTABLE CHEST 1 VIEW COMPARISON:   11/24/2017 FINDINGS: Slight interval increase in left base atelectasis with probable small left effusion. Right lung remains clear. No evidence for pneumothorax The cardio pericardial silhouette is enlarged. The visualized bony structures of the thorax are intact. Telemetry leads overlie the chest. IMPRESSION: Slight increase in left base atelectasis/effusion. Electronically Signed   By: Misty Stanley M.D.   On: 11/25/2017 09:59   Ir Thoracentesis Asp Pleural Space W/img Guide  Result Date: 11/24/2017 INDICATION: Patient with concern for TB, now with recurrent left pleural effusion. Request is made for diagnostic and therapeutic thoracentesis. EXAM: ULTRASOUND GUIDED DIAGNOSTIC AND THERAPEUTIC THORACENTESIS MEDICATIONS: 10 mL 2%  lidocaine COMPLICATIONS: None immediate. PROCEDURE: An ultrasound guided thoracentesis was thoroughly discussed with the patient and questions answered. The benefits, risks, alternatives and complications were also discussed. The patient understands and wishes to proceed with the procedure. Written consent was obtained. Ultrasound was performed to localize and mark an adequate pocket of fluid in the left chest. The area was then prepped and draped in the normal sterile fashion. 2% Lidocaine was used for local anesthesia. Under ultrasound guidance a Safe-T-Centesis catheter was introduced. Thoracentesis was performed. The catheter was removed and a dressing applied. FINDINGS: A total of approximately 800 mL of amber fluid was removed. Samples were sent to the laboratory as requested by the clinical team. IMPRESSION: Successful ultrasound guided diagnostic and therapeutic left thoracentesis yielding 800 mL of pleural fluid. Read by: Brynda Greathouse PA-C Electronically Signed   By: Sandi Mariscal M.D.   On: 11/24/2017 16:14        Scheduled Meds: . aspirin EC  81 mg Oral Daily  . azithromycin  500 mg Oral Daily  . diltiazem  120 mg Oral BID  . metoprolol tartrate  100 mg Oral BID  .  sodium chloride flush  3 mL Intravenous Q12H  . tuberculin  5 Units Intradermal Once   Continuous Infusions: . sodium chloride    . cefTRIAXone (ROCEPHIN)  IV 1 g (11/25/17 1219)     LOS: 9 days    Time spent: 35 minutes.     Elmarie Shiley, MD [facility] Pager [phone]  If 7PM-7AM, please contact night-coverage [email or web address] Password [email or web address] 11/25/2017, 1:20 PM

## 2017-11-25 NOTE — Progress Notes (Addendum)
Name: Scott Rowe MRN: [id number] DOB: 02/21/1971    ADMISSION DATE:  11/16/2017 CONSULTATION DATE: November 19, 2017  REFERRING MD : Hospitalist service  CHIEF COMPLAINT: Shortness of breath cough  BRIEF PATIENT DESCRIPTION: Large left-sided pleural effusion could be pleural parapneumonic workup for the fluid still pending adenosine deaminase is on airborne precautions possibly TB. Had fever last night still coughing still short of breath  STUDIES:  Chest x-ray with reexamination of fluid were repeat CAT scan I did send the eminence of triglycerides at that the cytology has been negative.    SUBJECTIVE:  Feels better but still has pleuritic type CP  VITAL SIGNS: Temp:  [98 F (36.7 C)-98.6 F (37 C)] 98.6 F (37 C) (01/30 0616) Pulse Rate:  [86-103] 103 (01/30 1100) Resp:  [18-20] 20 (01/30 0616) BP: (112-139)/(80-94) 139/94 (01/30 1100) SpO2:  [98 %-100 %] 98 % (01/30 0616) Weight:  [193 lb 4.8 oz (87.7 kg)-195 lb 3.2 oz (88.5 kg)] 193 lb 4.8 oz (87.7 kg) (01/30 0616)  PHYSICAL EXAMINATION: General:  awake, alert, no focal def  HEENT: MMM, no JVD PSY: awake and alert  Neuro: no focal def  CV: rrr PULM: some rhonchi. No wheeze  WU:JWJXGI:soft, soft not tender  Extremities:warm and dry  Skin: right for arm PPD site     Recent Labs  Lab 11/19/17 0503 11/25/17 0517  NA 136 136  K 3.9 4.3  CL 103 100*  CO2 23 25  BUN 14 15  CREATININE 0.99 0.99  GLUCOSE 101* 107*   Recent Labs  Lab 11/22/17 0311 11/23/17 0833 11/25/17 0517  HGB 11.4* 12.1* 12.1*  HCT 34.9* 36.9* 37.1*  WBC 14.2* 15.2* 12.7*  PLT 427* 485* 439*   Dg Chest 1 View  Result Date: 11/24/2017 CLINICAL DATA:  47 year old male with a history of recent left-sided thoracentesis EXAM: CHEST 1 VIEW COMPARISON:  Chest x-ray 11/23/2017, CT 11/23/2017 FINDINGS: Cardiomediastinal silhouette unchanged. Improved aeration at the left base. Blunting of left costophrenic angle. No pneumothorax. No confluent  airspace disease. IMPRESSION: Improved aeration at the left base with decreased pleural effusion and no evidence of pneumothorax. Electronically Signed   By: Gilmer MorJaime  Wagner D.O.   On: 11/24/2017 14:30   Ct Chest W Contrast  Result Date: 11/23/2017 CLINICAL DATA:  Pleural effusion follow up EXAM: CT CHEST WITH CONTRAST TECHNIQUE: Multidetector CT imaging of the chest was performed during intravenous contrast administration. CONTRAST:  75 cc ISOVUE-300 IOPAMIDOL (ISOVUE-300) INJECTION 61% COMPARISON:  CT chest dated 11/18/2017. chest CT dated 11/16/2017. FINDINGS: Cardiovascular: No thoracic aortic aneurysm or evidence of aortic dissection. Cardiomegaly. Small pericardial effusion, similar to previous exam. Mediastinum/Nodes: Stable mildly prominent lymph node within the right lower paratracheal space, measuring 12 mm short axis dimension. Additional stable mildly prominent lymph node within the subcarinal space, also measuring approximately 12 mm short axis dimension. Additional scattered smaller lymph nodes within the mediastinum. No new lymphadenopathy. Esophagus appears normal.  Trachea appears normal. Lungs/Pleura: Moderate-sized left pleural effusion, with adjacent compressive atelectasis, increased compared to the chest CT of 11/18/2017, less than the pleural effusion that was seen on 11/16/2017. Small right pleural effusion with adjacent compressive atelectasis. Lungs otherwise clear.  No pneumothorax. Upper Abdomen: No acute findings within the upper abdomen. Musculoskeletal: No acute or suspicious osseous finding. IMPRESSION: 1. Moderate-sized left pleural effusion, with adjacent compressive atelectasis. The pleural effusion has reaccumulated since chest CT of 11/18/2017 but is not as large as the pleural effusion seen on earlier chest CT of 11/16/2017.  2. Small right pleural effusion, similar to previous exams. 3. Small pericardial effusion, similar to previous exam. Stable cardiomegaly. 4. Stable mild  lymphadenopathy within the mediastinum, as detailed above. Electronically Signed   By: Bary Richard M.D.   On: 11/23/2017 23:38   Dg Chest Port 1 View  Result Date: 11/25/2017 CLINICAL DATA:  Left-sided thoracentesis yesterday EXAM: PORTABLE CHEST 1 VIEW COMPARISON:  11/24/2017 FINDINGS: Slight interval increase in left base atelectasis with probable small left effusion. Right lung remains clear. No evidence for pneumothorax The cardio pericardial silhouette is enlarged. The visualized bony structures of the thorax are intact. Telemetry leads overlie the chest. IMPRESSION: Slight increase in left base atelectasis/effusion. Electronically Signed   By: Kennith Center M.D.   On: 11/25/2017 09:59   Ir Thoracentesis Asp Pleural Space W/img Guide  Result Date: 11/24/2017 INDICATION: Patient with concern for TB, now with recurrent left pleural effusion. Request is made for diagnostic and therapeutic thoracentesis. EXAM: ULTRASOUND GUIDED DIAGNOSTIC AND THERAPEUTIC THORACENTESIS MEDICATIONS: 10 mL 2% lidocaine COMPLICATIONS: None immediate. PROCEDURE: An ultrasound guided thoracentesis was thoroughly discussed with the patient and questions answered. The benefits, risks, alternatives and complications were also discussed. The patient understands and wishes to proceed with the procedure. Written consent was obtained. Ultrasound was performed to localize and mark an adequate pocket of fluid in the left chest. The area was then prepped and draped in the normal sterile fashion. 2% Lidocaine was used for local anesthesia. Under ultrasound guidance a Safe-T-Centesis catheter was introduced. Thoracentesis was performed. The catheter was removed and a dressing applied. FINDINGS: A total of approximately 800 mL of amber fluid was removed. Samples were sent to the laboratory as requested by the clinical team. IMPRESSION: Successful ultrasound guided diagnostic and therapeutic left thoracentesis yielding 800 mL of pleural  fluid. Read by: Loyce Dys PA-C Electronically Signed   By: Simonne Come M.D.   On: 11/24/2017 16:14   CT Chest 1/28 Moderate-sized left pleural effusion, adjacent compressive atelectasis. The pleural effusion has reaccumulated since chest CT of 11/18/2017 but is not as large as the pleural effusion  Small right pleural effusion, similar to previous exams. Small pericardial effusion, similar to previous exam. Stable cardiomegaly. Stable mild lymphadenopathy within the mediastinum, as detailed above.   ASSESSMENT / PLAN: Pleuritic chest pain.  Exudative Recurrent left pleural effusion; ? parapneumonic after recent CAP?  --cytology has been negative   --Reaccumulation of pleural effusion per CXR and CT 1/28 --Fever/shortness of breath  - Repeat CT Chest 1/28 - CT Chest repeated 1/28>> see above - IR for US guided thoracentesis completed on 1/29  - QF negative (fax on way-->I called [facility]) - cxr : improved aeration. Left basilar atx  Plan/rec F/u afb (from pleural fluid) F/u adenosine deaminase  Complete abx course  If afb and adenosine deaminase neg can be discharged and we can f/u as out-pt  Will add motrin for pain Am pa/lat cxr we will see on 1/31 Cont precautions for now   Simonne Martinet ACNP-BC [facility] Pulmonary/Critical Care Pager # [phone] OR # [phone] if no answer   11/25/2017, 12:30 PM

## 2017-11-26 ENCOUNTER — Inpatient Hospital Stay (HOSPITAL_COMMUNITY): Payer: Self-pay

## 2017-11-26 LAB — CD19 AND CD20, FLOW CYTOMETRY
% CD19: 12 %
% CD20: 12 %

## 2017-11-26 LAB — URINALYSIS, ROUTINE W REFLEX MICROSCOPIC
BILIRUBIN URINE: NEGATIVE
GLUCOSE, UA: NEGATIVE mg/dL
HGB URINE DIPSTICK: NEGATIVE
Ketones, ur: NEGATIVE mg/dL
Leukocytes, UA: NEGATIVE
Nitrite: NEGATIVE
PH: 6 (ref 5.0–8.0)
Protein, ur: NEGATIVE mg/dL
SPECIFIC GRAVITY, URINE: 1.019 (ref 1.005–1.030)

## 2017-11-26 LAB — ACID FAST SMEAR (AFB)

## 2017-11-26 LAB — ACID FAST SMEAR (AFB, MYCOBACTERIA): Acid Fast Smear: NEGATIVE

## 2017-11-26 LAB — COMPREHENSIVE METABOLIC PANEL
ALBUMIN: 2.3 g/dL — AB (ref 3.5–5.0)
ALK PHOS: 156 U/L — AB (ref 38–126)
ALT: 94 U/L — ABNORMAL HIGH (ref 17–63)
ANION GAP: 11 (ref 5–15)
AST: 42 U/L — AB (ref 15–41)
BUN: 12 mg/dL (ref 6–20)
CO2: 24 mmol/L (ref 22–32)
Calcium: 8.4 mg/dL — ABNORMAL LOW (ref 8.9–10.3)
Chloride: 102 mmol/L (ref 101–111)
Creatinine, Ser: 0.95 mg/dL (ref 0.61–1.24)
GFR calc Af Amer: >60 mL/min (ref >60)
GFR calc non Af Amer: >60 mL/min (ref >60)
GLUCOSE: 100 mg/dL — AB (ref 65–99)
POTASSIUM: 4.3 mmol/L (ref 3.5–5.1)
SODIUM: 137 mmol/L (ref 135–145)
Total Bilirubin: 0.6 mg/dL (ref 0.3–1.2)
Total Protein: 7.1 g/dL (ref 6.5–8.1)

## 2017-11-26 LAB — CBC
HEMATOCRIT: 36.5 % — AB (ref 39.0–52.0)
HEMOGLOBIN: 11.9 g/dL — AB (ref 13.0–17.0)
MCH: 25.1 pg — AB (ref 26.0–34.0)
MCHC: 32.6 g/dL (ref 30.0–36.0)
MCV: 77 fL — ABNORMAL LOW (ref 78.0–100.0)
Platelets: 453 10*3/uL — ABNORMAL HIGH (ref 150–400)
RBC: 4.74 MIL/uL (ref 4.22–5.81)
RDW: 13.1 % (ref 11.5–15.5)
WBC: 14.1 10*3/uL — ABNORMAL HIGH (ref 4.0–10.5)

## 2017-11-26 NOTE — Progress Notes (Signed)
PROGRESS NOTE    Scott Rowe  LFY:[id number] DOB: 26-Aug-1971 DOA: 11/16/2017 PCP: Patient, No Pcp Per    Brief Narrative: Scott Rowe is a 47 y.o. male with a history of HTN not on medications and asthma who presented to the ED with dyspnea, chest pain and productive cough. CT chest demonstrated L > R pleural effusions with compressive atelectasis vs. infiltrate with nonspecific enlarged LNs. He was hypoxic, started on levaquin and supplemental oxygen. Left thoracentesis of 1.4L on 1/23 showed exudate and repeat CTA ruled out PE. No definite mass noted. Cultures have remained negative to date. Cytology from effusion was negative. Thoracentesis repeated 1/29.      Assessment & Plan:   Principal Problem:   PNA (pneumonia) Active Problems:   Acute respiratory failure with hypoxia (HCC)   Hypertension   Pleural effusion   S/P thoracentesis   1-Acute hypoxic Respiratory failure; secondary to PNA and pleural effusion.  IV antibiotics.  He has had two thoracentesis.  Pulmonary following.  Gold quantiferon negative,  WBC at 14.   2-Pleural effusion, left exudative.  He has had thoracentesis times 2.  Fluids, WBC 2850, gram stain negative, culture negative FAB one of three negative Chest x ray today, with slightly recurrence of pleural effusion.   Autoimmune work up (RF weakly positive at 15.8, ANA neg, ESR grossly elevated at 70),  On IV ceftriaxone and azithromycin.  Chest x ray stable effusion.  Unlikely TB related but awaiting results of AD pleural fluids.   PPD positive 20 mm; discussed with ID if pleural TB rule out patient will need out patient treatment for latent TB.  Discussed with Dr Chase Caller, they will help with treatment/   Transaminases;  Increasing. Change Vicodin to oxycodone. Avoid tylenol.  Check hepatitis panel. Pending.  Stable.    Paroxysmal atrial flutter: Echocardiogram shows normal atria. TSH 1.481.  Started ASA for CHA2DS2-VASc of 1 (HTN). On  metoprolol and Cardizem.   Pericardial effusion: Small, not hemodynamically significant. No rub. Stable on serial CT's.     DVT prophylaxis: SCD Code Status: full code.  Family Communication: care discussed with patient  Disposition Plan: to be determine  Consultants:   Pulmonology   Procedures:  Thoracentesis.    Antimicrobials: ceftriaxone and azithromycin,      Subjective: He is still having left side chest pain. Breathing is ok.   Objective: Vitals:   11/25/17 2226 11/26/17 0545 11/26/17 0900 11/26/17 1218  BP: 116/87 113/73 112/71 111/73  Pulse: 84 73 96 76  Resp:  _0 Temp:  99.1 F (37.3 C) 99.2 F (37.3 C) 98.2 F (36.8 C)  TempSrc:  Oral Oral Oral  SpO2:  98% 98% 99%  Weight:  88.3 kg (194 lb 9.6 oz)    Height:        Intake/Output Summary (Last 24 hours) at 11/26/2017 1301 Last data filed at 11/26/2017 1218 Gross per 24 hour  Intake 1230 ml  Output 1325 ml  Net -95 ml   Filed Weights   11/24/17 2003 11/25/17 0616 11/26/17 0545  Weight: 88.5 kg (195 lb 3.2 oz) 87.7 kg (193 lb 4.8 oz) 88.3 kg (194 lb 9.6 oz)    Examination:  General exam: NAD Respiratory system: ronchus bases.  Cardiovascular system: S 1, S 2 RRR Gastrointestinal system: BS present, soft, nt Central nervous system: non focal. Extremities: Symmetric power Skin: no rash.    Data Reviewed: I have personally reviewed following labs and imaging studies  CBC: Recent Labs  Lab 11/21/17 0359 11/22/17 0311 11/23/17 0833 11/25/17 0517 11/26/17 0618  WBC 14.2* 14.2* 15.2* 12.7* 14.1*  HGB 11.7* 11.4* 12.1* 12.1* 11.9*  HCT 35.8* 34.9* 36.9* 37.1* 36.5*  MCV 77.3* 77.0* 76.7* 77.1* 77.0*  PLT 424* 427* 485* 439* 315*   Basic Metabolic Panel: Recent Labs  Lab 11/25/17 0517 11/26/17 0618  NA 136 137  K 4.3 4.3  CL 100* 102  CO2 25 24  GLUCOSE 107* 100*  BUN 15 12  CREATININE 0.99 0.95  CALCIUM 8.3* 8.4*   GFR: Estimated Creatinine Clearance: 106.1 mL/min  (by C-G formula based on SCr of 0.95 mg/dL). Liver Function Tests: Recent Labs  Lab 11/25/17 0517 11/26/17 0618  AST 57* 42*  ALT 107* 94*  ALKPHOS 170* 156*  BILITOT 0.6 0.6  PROT 7.0 7.1  ALBUMIN 2.4* 2.3*   No results for input(s): LIPASE, AMYLASE in the last 168 hours. No results for input(s): AMMONIA in the last 168 hours. Coagulation Profile: No results for input(s): INR, PROTIME in the last 168 hours. Cardiac Enzymes: No results for input(s): CKTOTAL, CKMB, CKMBINDEX, TROPONINI in the last 168 hours. BNP (last 3 results) No results for input(s): PROBNP in the last 8760 hours. HbA1C: No results for input(s): HGBA1C in the last 72 hours. CBG: No results for input(s): GLUCAP in the last 168 hours. Lipid Profile: No results for input(s): CHOL, HDL, LDLCALC, TRIG, CHOLHDL, LDLDIRECT in the last 72 hours. Thyroid Function Tests: No results for input(s): TSH, T4TOTAL, FREET4, T3FREE, THYROIDAB in the last 72 hours. Anemia Panel: No results for input(s): VITAMINB12, FOLATE, FERRITIN, TIBC, IRON, RETICCTPCT in the last 72 hours. Sepsis Labs: No results for input(s): PROCALCITON, LATICACIDVEN in the last 168 hours.  Recent Results (from the past 240 hour(s))  Culture, blood (routine x 2)     Status: None   Collection Time: 11/16/17  1:25 PM  Result Value Ref Range Status   Specimen Description BLOOD RIGHT ANTECUBITAL  Final   Special Requests   Final    BOTTLES DRAWN AEROBIC AND ANAEROBIC Blood Culture adequate volume   Culture NO GROWTH 5 DAYS  Final   Report Status 11/21/2017 FINAL  Final  Culture, blood (routine x 2)     Status: None   Collection Time: 11/16/17  3:09 PM  Result Value Ref Range Status   Specimen Description BLOOD LEFT ANTECUBITAL  Final   Special Requests   Final    BOTTLES DRAWN AEROBIC AND ANAEROBIC Blood Culture adequate volume   Culture NO GROWTH 5 DAYS  Final   Report Status 11/21/2017 FINAL  Final  Gram stain     Status: None   Collection  Time: 11/18/17 12:48 PM  Result Value Ref Range Status   Specimen Description PLEURAL LEFT  Final   Special Requests NONE  Final   Gram Stain   Final    FEW WBC PRESENT, PREDOMINANTLY MONONUCLEAR NO ORGANISMS SEEN    Report Status 11/18/2017 FINAL  Final  Culture, body fluid-bottle     Status: None   Collection Time: 11/18/17 12:48 PM  Result Value Ref Range Status   Specimen Description PLEURAL LEFT  Final   Special Requests NONE  Final   Culture NO GROWTH 5 DAYS  Final   Report Status 11/23/2017 FINAL  Final  Culture, expectorated sputum-assessment     Status: None   Collection Time: 11/23/17 10:40 AM  Result Value Ref Range Status   Specimen Description SPUTUM  Final   Special Requests Normal  Final   Sputum evaluation   Final    Sputum specimen not acceptable for testing.  Please recollect.   RESULT CALLED TO, READ BACK BY AND VERIFIED WITH: Chana Bode RN 11/24/17 0446 JDW    Report Status 11/25/2017 FINAL  Final  Acid Fast Smear (AFB)     Status: None   Collection Time: 11/23/17 10:40 AM  Result Value Ref Range Status   AFB Specimen Processing Concentration  Final   Acid Fast Smear Negative  Final    Comment: (NOTE) Performed At: [facility] [address] [place], [place] [id number] Rush Farmer MD [id number]    Source (AFB) SPUTUM  Final  Gram stain     Status: None   Collection Time: 11/24/17 12:29 AM  Result Value Ref Range Status   Specimen Description FLUID PLEURAL LEFT  Final   Special Requests NONE  Final   Gram Stain   Final    FEW WBC PRESENT, PREDOMINANTLY PMN NO ORGANISMS SEEN    Report Status 11/25/2017 FINAL  Final  Acid Fast Smear (AFB)     Status: None   Collection Time: 11/24/17  6:42 AM  Result Value Ref Range Status   AFB Specimen Processing Concentration  Final   Acid Fast Smear Negative  Final    Comment: (NOTE) Performed At: [facility] [place], [place] [id number] Rush Farmer MD [id number]     Source (AFB) EXPECTORATED SPUTUM  Final         Radiology Studies: Dg Chest 1 View  Result Date: 11/24/2017 CLINICAL DATA:  47 year old male with a history of recent left-sided thoracentesis EXAM: CHEST 1 VIEW COMPARISON:  Chest x-ray 11/23/2017, CT 11/23/2017 FINDINGS: Cardiomediastinal silhouette unchanged. Improved aeration at the left base. Blunting of left costophrenic angle. No pneumothorax. No confluent airspace disease. IMPRESSION: Improved aeration at the left base with decreased pleural effusion and no evidence of pneumothorax. Electronically Signed   By: Corrie Mckusick D.O.   On: 11/24/2017 14:30   Dg Chest 2 View  Result Date: 11/26/2017 CLINICAL DATA:  Pleural effusions.  Recent left thoracentesis. EXAM: CHEST  2 VIEW COMPARISON:  11/25/2017 FINDINGS: The heart size and mediastinal contours are within normal limits. Small left pleural effusion shows no significant change in size. There is persistent atelectasis or consolidation in the left lower lobe. No evidence of pneumothorax. Right lung is clear. IMPRESSION: No significant change in small left pleural effusion and left lower lobe atelectasis versus consolidation. Electronically Signed   By: Earle Gell M.D.   On: 11/26/2017 09:05   Dg Chest Port 1 View  Result Date: 11/25/2017 CLINICAL DATA:  Left-sided thoracentesis yesterday EXAM: PORTABLE CHEST 1 VIEW COMPARISON:  11/24/2017 FINDINGS: Slight interval increase in left base atelectasis with probable small left effusion. Right lung remains clear. No evidence for pneumothorax The cardio pericardial silhouette is enlarged. The visualized bony structures of the thorax are intact. Telemetry leads overlie the chest. IMPRESSION: Slight increase in left base atelectasis/effusion. Electronically Signed   By: Misty Stanley M.D.   On: 11/25/2017 09:59   Ir Thoracentesis Asp Pleural Space W/img Guide  Result Date: 11/24/2017 INDICATION: Patient with concern for TB, now with recurrent left  pleural effusion. Request is made for diagnostic and therapeutic thoracentesis. EXAM: ULTRASOUND GUIDED DIAGNOSTIC AND THERAPEUTIC THORACENTESIS MEDICATIONS: 10 mL 2% lidocaine COMPLICATIONS: None immediate. PROCEDURE: An ultrasound guided thoracentesis was thoroughly discussed with the patient and questions answered. The benefits, risks, alternatives and complications were also discussed. The patient  understands and wishes to proceed with the procedure. Written consent was obtained. Ultrasound was performed to localize and mark an adequate pocket of fluid in the left chest. The area was then prepped and draped in the normal sterile fashion. 2% Lidocaine was used for local anesthesia. Under ultrasound guidance a Safe-T-Centesis catheter was introduced. Thoracentesis was performed. The catheter was removed and a dressing applied. FINDINGS: A total of approximately 800 mL of amber fluid was removed. Samples were sent to the laboratory as requested by the clinical team. IMPRESSION: Successful ultrasound guided diagnostic and therapeutic left thoracentesis yielding 800 mL of pleural fluid. Read by: Brynda Greathouse PA-C Electronically Signed   By: Sandi Mariscal M.D.   On: 11/24/2017 16:14        Scheduled Meds: . aspirin EC  81 mg Oral Daily  . azithromycin  500 mg Oral Daily  . diltiazem  120 mg Oral BID  . metoprolol tartrate  100 mg Oral BID  . sodium chloride flush  3 mL Intravenous Q12H   Continuous Infusions: . sodium chloride    . cefTRIAXone (ROCEPHIN)  IV 1 g (11/26/17 0000)     LOS: 10 days    Time spent: 35 minutes.     Elmarie Shiley, MD [facility] Pager [phone]  If 7PM-7AM, please contact night-coverage [email or web address] Password [email or web address] 11/26/2017, 1:01 PM

## 2017-11-26 NOTE — Progress Notes (Signed)
Name: Scott Rowe MRN: [id number] DOB: 02/05/1971    ADMISSION DATE:  11/16/2017 CONSULTATION DATE: November 19, 2017  REFERRING MD : Hospitalist service  CHIEF COMPLAINT: Shortness of breath cough  BRIEF PATIENT DESCRIPTION: Large left-sided pleural effusion could be pleural parapneumonic workup for the fluid still pending adenosine deaminase is on airborne precautions possibly TB. Had fever last night still coughing still short of breath   EVENTS 121 - hiv negative 1/23 - pleural fluid - exudat with LDH 237 , protien  4.8 and glucose not avail. Mesothelial cells +, 69% lymphs 1/24-- Quant gold - negative 1/28 - Results for Scott MesaYUSUF, Chrystian (MRN [id number]) as of 11/26/2017 09:26  Ref. Range 11/23/2017 10:51  Strep Pneumo Urinary Antigen Latest Ref Range: NEGATIVE  NEGATIVE  L. pneumophila Serogp 1 Ur Ag Latest Ref Range: Negative  Negative   1/29  - pleural efluid with Gluc 122, LDH 130, and 69% polys and mesothelia cells +. AFB smear negative 1/30 - Feels better but still has pleuritic type CP    SUBJECTIVE/OVERNIGHT/INTERVAL HX 1/31 - anxious he is nto better and no etiology. Reports migrated from [place]  Years ago. Does nto remembe TB exposure there. Lab results reviewed  VITAL SIGNS: Temp:  [97.5 F (36.4 C)-99.2 F (37.3 C)] 99.2 F (37.3 C) (01/31 0900) Pulse Rate:  [73-103] 96 (01/31 0900) Resp:  [18-20] 18 (01/31 0900) BP: (112-139)/(71-94) 112/71 (01/31 0900) SpO2:  [98 %] 98 % (01/31 0900) Weight:  [194 lb 9.6 oz (88.3 kg)] 194 lb 9.6 oz (88.3 kg) (01/31 0545)  PHYSICAL EXAMINATION:  General Appearance:  Looks well . anxious  Head:    Normocephalic, without obvious abnormality, atraumatic  Eyes:    PERRL - yes, conjunctiva/corneas - clear      Ears:    Normal external ear canals, both ears  Nose:   NG tube - no but has [place]  Throat:  ETT TUBE - no , OG tube - no  Neck:   Supple,  No enlargement/tenderness/nodules     Lungs:     Clear to auscultation  bilaterally,  Chest wall:    No deformity  Heart:    S1 and S2 normal, no murmur, CVP - no.  Pressors - no  Abdomen:     Soft, no masses, no organomegaly  Genitalia:    Not done  Rectal:   not done  Extremities:   Extremities- intact     Skin:   Intact in exposed areas .     Neurologic:   Sedation - none -> RASS - +1 . Moves all 4s - yes. CAM-ICU - neg . Orientation - x3+_         Recent Labs  Lab 11/25/17 0517 11/26/17 0618  NA 136 137  K 4.3 4.3  CL 100* 102  CO2 25 24  BUN 15 12  CREATININE 0.99 0.95  GLUCOSE 107* 100*   Recent Labs  Lab 11/23/17 0833 11/25/17 0517 11/26/17 0618  HGB 12.1* 12.1* 11.9*  HCT 36.9* 37.1* 36.5*  WBC 15.2* 12.7* 14.1*  PLT 485* 439* 453*   Dg Chest 1 View  Result Date: 11/24/2017 CLINICAL DATA:  47 year old male with a history of recent left-sided thoracentesis EXAM: CHEST 1 VIEW COMPARISON:  Chest x-ray 11/23/2017, CT 11/23/2017 FINDINGS: Cardiomediastinal silhouette unchanged. Improved aeration at the left base. Blunting of left costophrenic angle. No pneumothorax. No confluent airspace disease. IMPRESSION: Improved aeration at the left base with decreased pleural effusion and no evidence of  pneumothorax. Electronically Signed   By: Gilmer Mor D.O.   On: 11/24/2017 14:30   Dg Chest 2 View  Result Date: 11/26/2017 CLINICAL DATA:  Pleural effusions.  Recent left thoracentesis. EXAM: CHEST  2 VIEW COMPARISON:  11/25/2017 FINDINGS: The heart size and mediastinal contours are within normal limits. Small left pleural effusion shows no significant change in size. There is persistent atelectasis or consolidation in the left lower lobe. No evidence of pneumothorax. Right lung is clear. IMPRESSION: No significant change in small left pleural effusion and left lower lobe atelectasis versus consolidation. Electronically Signed   By: Myles Rosenthal M.D.   On: 11/26/2017 09:05   Dg Chest Port 1 View  Result Date: 11/25/2017 CLINICAL DATA:   Left-sided thoracentesis yesterday EXAM: PORTABLE CHEST 1 VIEW COMPARISON:  11/24/2017 FINDINGS: Slight interval increase in left base atelectasis with probable small left effusion. Right lung remains clear. No evidence for pneumothorax The cardio pericardial silhouette is enlarged. The visualized bony structures of the thorax are intact. Telemetry leads overlie the chest. IMPRESSION: Slight increase in left base atelectasis/effusion. Electronically Signed   By: Kennith Center M.D.   On: 11/25/2017 09:59   Ir Thoracentesis Asp Pleural Space W/img Guide  Result Date: 11/24/2017 INDICATION: Patient with concern for TB, now with recurrent left pleural effusion. Request is made for diagnostic and therapeutic thoracentesis. EXAM: ULTRASOUND GUIDED DIAGNOSTIC AND THERAPEUTIC THORACENTESIS MEDICATIONS: 10 mL 2% lidocaine COMPLICATIONS: None immediate. PROCEDURE: An ultrasound guided thoracentesis was thoroughly discussed with the patient and questions answered. The benefits, risks, alternatives and complications were also discussed. The patient understands and wishes to proceed with the procedure. Written consent was obtained. Ultrasound was performed to localize and mark an adequate pocket of fluid in the left chest. The area was then prepped and draped in the normal sterile fashion. 2% Lidocaine was used for local anesthesia. Under ultrasound guidance a Safe-T-Centesis catheter was introduced. Thoracentesis was performed. The catheter was removed and a dressing applied. FINDINGS: A total of approximately 800 mL of amber fluid was removed. Samples were sent to the laboratory as requested by the clinical team. IMPRESSION: Successful ultrasound guided diagnostic and therapeutic left thoracentesis yielding 800 mL of pleural fluid. Read by: Loyce Dys PA-C Electronically Signed   By: Simonne Come M.D.   On: 11/24/2017 16:14   CT Chest 1/28 Moderate-sized left pleural effusion, adjacent compressive atelectasis. The  pleural effusion has reaccumulated since chest CT of 11/18/2017 but is not as large as the pleural effusion  Small right pleural effusion, similar to previous exams. Small pericardial effusion, similar to previous exam. Stable cardiomegaly. Stable mild lymphadenopathy within the mediastinum, as detailed above.   ASSESSMENT / PLAN: Left exudative pleural effusion - Other than coming from endemic area for TB odds of HIV negaive left pleural effusion is VERY LOW. Features against TB effusion    - protein < 5  - LDH < 500   - Gluc > 100 and esp > 60  - Mesothelial cells on cytology  - Quant gold x 1 negative (though will need repeat to know booster effect)  - No Pulmonary Infiltrates  - AFB smear x 1 negative (though this is not sensitive)  No evidence of autoimmune either  PLAN - await pleural fluid ADA - if < 40 doubt TB even more  - recommend NSAIDs for pain  - opd pulm followup - with CXR in 1 week  0- If fluid recurs, needs VATS thoracoscopy. If he declines then consider empiric  steroids  Future Appointments  Date Time Provider Department Center  12/03/2017 10:00 AM Bevelyn Ngo, NP [facility] None     PCCM will sign off D/w Dr Sunnie Nielsen   Dr. Kalman Shan, M.D., [facility].C.P Pulmonary and Critical Care Medicine Staff Physician, [facility] Center Director - Interstitial Lung Disease  Program  Pulmonary Fibrosis [facility] Network at [facility] [place], [place], [address]  Pager: [phone], If no answer or between  15:00h - 7:00h: call [phone] Telephone: [phone]

## 2017-11-27 DIAGNOSIS — J9811 Atelectasis: Secondary | ICD-10-CM

## 2017-11-27 DIAGNOSIS — R748 Abnormal levels of other serum enzymes: Secondary | ICD-10-CM

## 2017-11-27 DIAGNOSIS — R7611 Nonspecific reaction to tuberculin skin test without active tuberculosis: Secondary | ICD-10-CM

## 2017-11-27 DIAGNOSIS — Z8611 Personal history of tuberculosis: Secondary | ICD-10-CM

## 2017-11-27 DIAGNOSIS — A1889 Tuberculosis of other sites: Secondary | ICD-10-CM

## 2017-11-27 LAB — HEPATITIS PANEL, ACUTE
HCV Ab: 0.2 s/co ratio (ref 0.0–0.9)
Hep A IgM: NEGATIVE
Hep B C IgM: NEGATIVE
Hepatitis B Surface Ag: NEGATIVE

## 2017-11-27 LAB — CBC
HEMATOCRIT: 36.3 % — AB (ref 39.0–52.0)
HEMOGLOBIN: 11.8 g/dL — AB (ref 13.0–17.0)
MCH: 25.1 pg — AB (ref 26.0–34.0)
MCHC: 32.5 g/dL (ref 30.0–36.0)
MCV: 77.1 fL — AB (ref 78.0–100.0)
PLATELETS: 463 10*3/uL — AB (ref 150–400)
RBC: 4.71 MIL/uL (ref 4.22–5.81)
RDW: 13.1 % (ref 11.5–15.5)
WBC: 14.7 10*3/uL — AB (ref 4.0–10.5)

## 2017-11-27 MED ORDER — RIFAMPIN 300 MG PO CAPS
600.0000 mg | ORAL_CAPSULE | Freq: Every day | ORAL | Status: DC
  Administered 2017-11-27 – 2017-12-04 (×8): 600 mg via ORAL
  Filled 2017-11-27 (×8): qty 2

## 2017-11-27 MED ORDER — ISONIAZID 300 MG PO TABS
300.0000 mg | ORAL_TABLET | Freq: Every day | ORAL | Status: DC
  Administered 2017-11-27 – 2017-12-04 (×8): 300 mg via ORAL
  Filled 2017-11-27 (×8): qty 1

## 2017-11-27 MED ORDER — PYRAZINAMIDE 500 MG PO TABS
2000.0000 mg | ORAL_TABLET | Freq: Every day | ORAL | Status: DC
  Administered 2017-11-27 – 2017-12-04 (×8): 2000 mg via ORAL
  Filled 2017-11-27 (×8): qty 4

## 2017-11-27 MED ORDER — VITAMIN B-6 50 MG PO TABS
50.0000 mg | ORAL_TABLET | Freq: Every day | ORAL | Status: DC
  Administered 2017-11-27 – 2017-12-04 (×8): 50 mg via ORAL
  Filled 2017-11-27 (×8): qty 1

## 2017-11-27 MED ORDER — ETHAMBUTOL HCL 400 MG PO TABS
1600.0000 mg | ORAL_TABLET | Freq: Every day | ORAL | Status: DC
  Administered 2017-11-27 – 2017-12-04 (×8): 1600 mg via ORAL
  Filled 2017-11-27 (×8): qty 4

## 2017-11-27 NOTE — Progress Notes (Signed)
PROGRESS NOTE    Boden Stucky  DPO:[id number] DOB: 06-06-1971 DOA: 11/16/2017 PCP: Patient, No Pcp Per    Brief Narrative: Franz Svec is a 47 y.o. male with a history of HTN not on medications and asthma who presented to the ED with dyspnea, chest pain and productive cough. CT chest demonstrated L > R pleural effusions with compressive atelectasis vs. infiltrate with nonspecific enlarged LNs. He was hypoxic, started on levaquin and supplemental oxygen. Left thoracentesis of 1.4L on 1/23 showed exudate and repeat CTA ruled out PE. No definite mass noted. Cultures have remained negative to date. Cytology from effusion was negative. Thoracentesis repeated 1/29.      Assessment & Plan:   Principal Problem:   PNA (pneumonia) Active Problems:   Acute respiratory failure with hypoxia (HCC)   Hypertension   Pleural effusion   S/P thoracentesis   Abnormal transaminases   Extrapulmonary tuberculosis   1-Acute hypoxic Respiratory failure; secondary to PNA and pleural effusion.  Completed IV antibiotics.  He has had two thoracentesis.  Pulmonary following.  Gold quantiferon negative,  WBC at 14.   2-Pleural effusion, left exudative.  He has had thoracentesis times 2.  Fluids, WBC 2850, gram stain negative, culture negative FAB one of three negative Chest x ray today, with slightly recurrence of pleural effusion.   Autoimmune work up (RF weakly positive at 15.8, ANA neg, ESR grossly elevated at 70),  On IV ceftriaxone and azithromycin. Finished treatment, discontinue by ID>  Chest x ray stable effusion.  ADA pending.  ID consulted, due to positive PPD. 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   Transaminases;  Increasing. Change Vicodin to oxycodone. Avoid tylenol.  Hepatitis panel negative.  Stable.    Paroxysmal atrial flutter: Echocardiogram shows normal atria. TSH 1.481.  Started ASA for CHA2DS2-VASc of 1 (HTN). On metoprolol and Cardizem.    Pericardial effusion: Small, not hemodynamically significant. No rub. Stable on serial CT's.     DVT prophylaxis: SCD Code Status: full code.  Family Communication: care discussed with patient  Disposition Plan: to be determine  Consultants:   Pulmonology   Procedures:  Thoracentesis.    Antimicrobials: ceftriaxone and azithromycin,      Subjective: Dyspnea stable.   Objective: Vitals:   11/26/17 2031 11/27/17 0623 11/27/17 0636 11/27/17 0856  BP: 129/81  126/87 122/79  Pulse: 76  99 (!) 101  Resp: 18  20 16   Temp: 98.6 F (37 C)  98.7 F (37.1 C) 98.4 F (36.9 C)  TempSrc: Oral  Oral Oral  SpO2: 100%  100% 98%  Weight:  88.3 kg (194 lb 9.6 oz)    Height:        Intake/Output Summary (Last 24 hours) at 11/27/2017 1427 Last data filed at 11/27/2017 1014 Gross per 24 hour  Intake 340 ml  Output 250 ml  Net 90 ml   Filed Weights   11/25/17 0616 11/26/17 0545 11/27/17 0623  Weight: 87.7 kg (193 lb 4.8 oz) 88.3 kg (194 lb 9.6 oz) 88.3 kg (194 lb 9.6 oz)    Examination:  General exam:NAD Respiratory system: crackles bases.  Cardiovascular system: S 1, S 2 RRR Gastrointestinal system: BS present, soft, nt Central nervous system: non focal.  Extremities: Symmetric power.  Skin: no rash.    Data Reviewed: I have personally reviewed following labs and imaging studies  CBC: Recent Labs  Lab 11/22/17 0311 11/23/17 4315 11/25/17 4008 11/26/17 0618 11/27/17 6761  WBC 14.2* 15.2* 12.7* 14.1* 14.7*  HGB 11.4* 12.1* 12.1* 11.9* 11.8*  HCT 34.9* 36.9* 37.1* 36.5* 36.3*  MCV 77.0* 76.7* 77.1* 77.0* 77.1*  PLT 427* 485* 439* 453* 240*   Basic Metabolic Panel: Recent Labs  Lab 11/25/17 0517 11/26/17 0618  NA 136 137  K 4.3 4.3  CL 100* 102  CO2 25 24  GLUCOSE 107* 100*  BUN 15 12  CREATININE 0.99 0.95  CALCIUM 8.3* 8.4*   GFR: Estimated Creatinine Clearance: 106.1 mL/min (by C-G formula based on SCr of 0.95 mg/dL). Liver Function  Tests: Recent Labs  Lab 11/25/17 0517 11/26/17 0618  AST 57* 42*  ALT 107* 94*  ALKPHOS 170* 156*  BILITOT 0.6 0.6  PROT 7.0 7.1  ALBUMIN 2.4* 2.3*   No results for input(s): LIPASE, AMYLASE in the last 168 hours. No results for input(s): AMMONIA in the last 168 hours. Coagulation Profile: No results for input(s): INR, PROTIME in the last 168 hours. Cardiac Enzymes: No results for input(s): CKTOTAL, CKMB, CKMBINDEX, TROPONINI in the last 168 hours. BNP (last 3 results) No results for input(s): PROBNP in the last 8760 hours. HbA1C: No results for input(s): HGBA1C in the last 72 hours. CBG: No results for input(s): GLUCAP in the last 168 hours. Lipid Profile: No results for input(s): CHOL, HDL, LDLCALC, TRIG, CHOLHDL, LDLDIRECT in the last 72 hours. Thyroid Function Tests: No results for input(s): TSH, T4TOTAL, FREET4, T3FREE, THYROIDAB in the last 72 hours. Anemia Panel: No results for input(s): VITAMINB12, FOLATE, FERRITIN, TIBC, IRON, RETICCTPCT in the last 72 hours. Sepsis Labs: No results for input(s): PROCALCITON, LATICACIDVEN in the last 168 hours.  Recent Results (from the past 240 hour(s))  Gram stain     Status: None   Collection Time: 11/18/17 12:48 PM  Result Value Ref Range Status   Specimen Description PLEURAL LEFT  Final   Special Requests NONE  Final   Gram Stain   Final    FEW WBC PRESENT, PREDOMINANTLY MONONUCLEAR NO ORGANISMS SEEN    Report Status 11/18/2017 FINAL  Final  Culture, body fluid-bottle     Status: None   Collection Time: 11/18/17 12:48 PM  Result Value Ref Range Status   Specimen Description PLEURAL LEFT  Final   Special Requests NONE  Final   Culture NO GROWTH 5 DAYS  Final   Report Status 11/23/2017 FINAL  Final  Culture, expectorated sputum-assessment     Status: None   Collection Time: 11/23/17 10:40 AM  Result Value Ref Range Status   Specimen Description SPUTUM  Final   Special Requests Normal  Final   Sputum evaluation    Final    Sputum specimen not acceptable for testing.  Please recollect.   RESULT CALLED TO, READ BACK BY AND VERIFIED WITH: Chana Bode RN 11/24/17 0446 JDW    Report Status 11/25/2017 FINAL  Final  Acid Fast Smear (AFB)     Status: None   Collection Time: 11/23/17 10:40 AM  Result Value Ref Range Status   AFB Specimen Processing Concentration  Final   Acid Fast Smear Negative  Final    Comment: (NOTE) Performed At: [facility] [address] [place], [place] [id number] Rush Farmer MD [id number]    Source (AFB) SPUTUM  Final  Gram stain     Status: None   Collection Time: 11/24/17 12:29 AM  Result Value Ref Range Status   Specimen Description FLUID PLEURAL LEFT  Final   Special Requests NONE  Final  Gram Stain   Final    FEW WBC PRESENT, PREDOMINANTLY PMN NO ORGANISMS SEEN    Report Status 11/25/2017 FINAL  Final  Acid Fast Smear (AFB)     Status: None   Collection Time: 11/24/17 12:29 AM  Result Value Ref Range Status   AFB Specimen Processing Concentration  Final   Acid Fast Smear Negative  Final    Comment: (NOTE) Performed At: [facility] [place], [place] [id number] Rush Farmer MD [id number]    Source (AFB) FLUID  Final    Comment: PLEURAL LEFT   Acid Fast Smear (AFB)     Status: None   Collection Time: 11/24/17  6:42 AM  Result Value Ref Range Status   AFB Specimen Processing Concentration  Final   Acid Fast Smear Negative  Final    Comment: (NOTE) Performed At: [facility] [place], [place] [id number] Rush Farmer MD MQ:[id number]    Source (AFB) EXPECTORATED SPUTUM  Final         Radiology Studies: Dg Chest 2 View  Result Date: 11/26/2017 CLINICAL DATA:  Pleural effusions.  Recent left thoracentesis. EXAM: CHEST  2 VIEW COMPARISON:  11/25/2017 FINDINGS: The heart size and mediastinal contours are within normal limits. Small left pleural effusion shows no significant change in size.  There is persistent atelectasis or consolidation in the left lower lobe. No evidence of pneumothorax. Right lung is clear. IMPRESSION: No significant change in small left pleural effusion and left lower lobe atelectasis versus consolidation. Electronically Signed   By: Earle Gell M.D.   On: 11/26/2017 09:05   US Abdomen Limited Ruq  Result Date: 11/26/2017 CLINICAL DATA:  Elevated LFTs EXAM: ULTRASOUND ABDOMEN LIMITED RIGHT UPPER QUADRANT COMPARISON:  None. FINDINGS: Gallbladder: No gallstones or wall thickening visualized. No sonographic Murphy sign noted by sonographer. Common bile duct: Diameter: 4.3 mm Liver: No focal lesion identified. Within normal limits in parenchymal echogenicity. Portal vein is patent on color Doppler imaging with normal direction of blood flow towards the liver. IMPRESSION: Normal study.  No cause for elevated LFTs identified. Electronically Signed   By: Dorise Bullion III M.D   On: 11/26/2017 20:18        Scheduled Meds: . aspirin EC  81 mg Oral Daily  . diltiazem  120 mg Oral BID  . ethambutol  1,600 mg Oral Daily  . isoniazid  300 mg Oral Daily  . metoprolol tartrate  100 mg Oral BID  . pyrazinamide  2,000 mg Oral Daily  . vitamin B-6  50 mg Oral Daily  . rifampin  600 mg Oral Daily  . sodium chloride flush  3 mL Intravenous Q12H   Continuous Infusions: . sodium chloride       LOS: 11 days    Time spent: 35 minutes.     Elmarie Shiley, MD [facility] Pager [phone]  If 7PM-7AM, please contact night-coverage [email or web address] Password [facility] 11/27/2017, 2:27 PM

## 2017-11-27 NOTE — Progress Notes (Addendum)
Name: Scott Rowe MRN: [id number] DOB: 09/25/1971    ADMISSION DATE:  11/16/2017 CONSULTATION DATE: November 19, 2017  REFERRING MD : Hospitalist service  CHIEF COMPLAINT: Shortness of breath cough  BRIEF PATIENT DESCRIPTION: Large left-sided pleural effusion could be pleural parapneumonic workup for the fluid still pending adenosine deaminase is on airborne precautions possibly TB. Had fever last night still coughing still short of breath   EVENTS 121 - hiv negative 1/23 - pleural fluid - exudat with LDH 237 , protien  4.8 and glucose not avail. Mesothelial cells +, 69% lymphs 1/24-- Quant gold - negative 1/28 - Results for Scott MesaYUSUF, Jovonta (MRN [id number]) as of 11/26/2017 09:26  Ref. Range 11/23/2017 10:51  Strep Pneumo Urinary Antigen Latest Ref Range: NEGATIVE  NEGATIVE  L. pneumophila Serogp 1 Ur Ag Latest Ref Range: Negative  Negative   1/29  - pleural efluid with Gluc 122, LDH 130, and 69% polys and mesothelia cells +. AFB smear negative 1/30 - Feels better but still has pleuritic type CP 1/31 - anxious he is nto better and no etiology. Reports migrated from [place]  Years ago. Does nto remembe TB exposure there. Lab results reviewed   SUBJECTIVE/OVERNIGHT/INTERVAL HX 21/19 - asked by DR Sunnie NielsenEgalado to see again; PPD positive per her. He is frustrated he has not answers to his pleural effusion  VITAL SIGNS: Temp:  [98.2 F (36.8 C)-99.2 F (37.3 C)] 98.7 F (37.1 C) (02/01 0636) Pulse Rate:  [76-99] 99 (02/01 0636) Resp:  [18-20] 20 (02/01 0636) BP: (111-129)/(71-87) 126/87 (02/01 0636) SpO2:  [98 %-100 %] 100 % (02/01 0636) Weight:  [88.3 kg (194 lb 9.6 oz)] 88.3 kg (194 lb 9.6 oz) (02/01 [id number])  PHYSICAL EXAMINATION: . Sitting in chair Watching TV Listening to music  Rt forearm: PPD positive - > atleast 20mm (per patient 204 days old - [facility] 11/24/17)    PULMONARY No results for input(s): PHART, PCO2ART, PO2ART, HCO3, TCO2, O2SAT in the last 168  hours.  Invalid input(s): PCO2, PO2  CBC Recent Labs  Lab 11/23/17 0833 11/25/17 0517 11/26/17 0618  HGB 12.1* 12.1* 11.9*  HCT 36.9* 37.1* 36.5*  WBC 15.2* 12.7* 14.1*  PLT 485* 439* 453*    COAGULATION No results for input(s): INR in the last 168 hours.  CARDIAC  No results for input(s): TROPONINI in the last 168 hours. No results for input(s): PROBNP in the last 168 hours.   CHEMISTRY Recent Labs  Lab 11/25/17 0517 11/26/17 0618  NA 136 137  K 4.3 4.3  CL 100* 102  CO2 25 24  GLUCOSE 107* 100*  BUN 15 12  CREATININE 0.99 0.95  CALCIUM 8.3* 8.4*   Estimated Creatinine Clearance: 106.1 mL/min (by C-G formula based on SCr of 0.95 mg/dL).   LIVER Recent Labs  Lab 11/25/17 0517 11/26/17 0618  AST 57* 42*  ALT 107* 94*  ALKPHOS 170* 156*  BILITOT 0.6 0.6  PROT 7.0 7.1  ALBUMIN 2.4* 2.3*     INFECTIOUS No results for input(s): LATICACIDVEN, PROCALCITON in the last 168 hours.   ENDOCRINE CBG (last 3)  No results for input(s): GLUCAP in the last 72 hours.       IMAGING x48h  - image(s) personally visualized  -   highlighted in bold Dg Chest 2 View  Result Date: 11/26/2017 CLINICAL DATA:  Pleural effusions.  Recent left thoracentesis. EXAM: CHEST  2 VIEW COMPARISON:  11/25/2017 FINDINGS: The heart size and mediastinal contours are within normal limits.  Small left pleural effusion shows no significant change in size. There is persistent atelectasis or consolidation in the left lower lobe. No evidence of pneumothorax. Right lung is clear. IMPRESSION: No significant change in small left pleural effusion and left lower lobe atelectasis versus consolidation. Electronically Signed   By: Myles Rosenthal M.D.   On: 11/26/2017 09:05   US Abdomen Limited Ruq  Result Date: 11/26/2017 CLINICAL DATA:  Elevated LFTs EXAM: ULTRASOUND ABDOMEN LIMITED RIGHT UPPER QUADRANT COMPARISON:  None. FINDINGS: Gallbladder: No gallstones or wall thickening visualized. No  sonographic Murphy sign noted by sonographer. Common bile duct: Diameter: 4.3 mm Liver: No focal lesion identified. Within normal limits in parenchymal echogenicity. Portal vein is patent on color Doppler imaging with normal direction of blood flow towards the liver. IMPRESSION: Normal study.  No cause for elevated LFTs identified. Electronically Signed   By: Gerome Sam III M.D   On: 11/26/2017 20:18       CT Chest 1/28 Moderate-sized left pleural effusion, adjacent compressive atelectasis. The pleural effusion has reaccumulated since chest CT of 11/18/2017 but is not as large as the pleural effusion  Small right pleural effusion, similar to previous exams. Small pericardial effusion, similar to previous exam. Stable cardiomegaly. Stable mild lymphadenopathy within the mediastinum, as detailed above.   ASSESSMENT / PLAN: Left exudative pleural effusion - Other than coming from endemic area for TB odds of HIV negaive left pleural effusion is VERY LOW. Features against TB effusion but some confusing results such as PPD Being positive     - protein < 5  - LDH < 500   - Gluc > 100 and esp > 60  - Mesothelial cells on cytology  - Quant gold x 1 negative 11/19/17 but PPD psotive Rt forearm on 11/27/2017 but >3 days - confusing results. REpeat Quant Gold pending 11/27/2017 for booster effect  - No Pulmonary Infiltrates  - AFB smear x 1 negative (though this is not sensitive)  No evidence of autoimmune either  PLAN - await pleural fluid ADA - if < 40 doubt TB and if  > 50 - likely TB effusion (this cn take days and can be followed as outpatient)  - recommend NSAIDs for pain  - opd pulm followup - with CXR in 1 week  - If fluid recurs, needs VATS thoracoscopy. If he declines then consider empiric steroids - recommend ID clinic follow-up as well   Noted: he was very frustrated he did not have answsers for his effusion. Explained we are still waiting on some results and above plan. If he is  not happy with above plan, recommend ID inpatient consult - d/w Dr Sunnie Nielsen  Future Appointments  Date Time Provider Department Center  12/03/2017 10:00 AM Bevelyn Ngo, NP [facility] None    (> 50% of this 15 min visit spent in face to face counseling or/and coordination of care)    Dr. Kalman Shan, M.D., [facility].C.P Pulmonary and Critical Care Medicine Staff Physician, [facility] Center Director - Interstitial Lung Disease  Program  Pulmonary Fibrosis [facility] Network at [facility] [place], [place], [address]  Pager: [phone], If no answer or between  15:00h - 7:00h: call [phone] Telephone: [phone]

## 2017-11-27 NOTE — Progress Notes (Signed)
      INFECTIOUS DISEASE ATTENDING ADDENDUM:   Date: 11/27/2017  Patient name: Scott Rowe  Medical record number: [id number]  Date of birth: 06/23/1971   Hadassah Pa[facility]Karen Hicks [facility] that patient be given his Monday DOSE OF 4 RIPE [facility] so that [facility] can meet him on Tuesday at his house with meds from there on out.   Paulette BlanchCornelius Van Dam 11/27/2017, 5:10 PM

## 2017-11-27 NOTE — Consult Note (Signed)
Date of Admission:  11/16/2017          Reason for Consult: Exudative Pleural Effusion     Referring Provider: Dr. Hartley BarefootBelkys Regalado   Assessment: Scott Rowe is a 47 y.o. male originally from [place] who presented with 3 weeks of progressive dyspnea, cough, and pleuritic chest pain. CT chest on admission illustrated a L>R pleural effusion with compressive atelectasis vs infiltration with nonspecific enlarged lymph nodes. Pleural fluid analysis indicating an exudative process that does not appear to be drug-induced, endocrine, parapneumonic, malignant, lymphatic, or autoimmune in nature. We have been consulted for evaluation of possible tuberculous pleural effusion.   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 Given that other causes of exudative effusions have be thoroughly investigated we will empirically begin treatment for active TB. We will contact the health department. Can discontinue the ceftriaxone and azithromycin at this point.   Plan: 1. Stop Ceftriaxone and Azithromycin  2. Start the following treatment regimen for TB: 1. Isoniazid 300 mg QD 2. Rifampin 600 mg QD 3. Pyrazinamide 2000 mg QD 4. Ethambutol 1600 mg QD 5. Pyridoxine 50 mg QD 3. Follow-up ADA 4. Spoke with the health department [facility](Carolyn Sneedvillehaffin, [phone]) who will review the patient's chart and contact the patient. She is asking that the primary team contact her prior to discharge to ensure they have gotten the patient his medications.   Principal Problem:   PNA (pneumonia) Active Problems:   Acute respiratory failure with hypoxia (HCC)   Hypertension   Pleural effusion   S/P thoracentesis  Scheduled Meds: . aspirin EC  81 mg Oral Daily  . azithromycin  500 mg Oral Daily  . diltiazem  120 mg Oral BID  . metoprolol tartrate  100 mg Oral BID  . sodium chloride flush  3 mL  Intravenous Q12H   Continuous Infusions: . sodium chloride    . cefTRIAXone (ROCEPHIN)  IV 1 g (11/27/17 0853)   PRN Meds:.sodium chloride, acetaminophen **OR** acetaminophen, hydrALAZINE, ibuprofen, lidocaine, menthol-cetylpyridinium, ondansetron **OR** ondansetron (ZOFRAN) IV, oxyCODONE, sodium chloride flush  HPI: Scott Rowe is a 47 y.o. male originally from [place] who presented with 3 weeks of progressive dyspnea, cough, and pleuritic chest pain. He states that [place] powder helped to relieve his chest pain. Denies changes of chest pain with position. He denies fevers, chills, hemoptysis, weight loss, night sweats. He did received the BCG vaccine as a child.   CT chest on admission illustrated a L>R pleural effusion with compressive atelectasis vs infiltration with nonspecific enlarged lymph nodes. Patient subsequently underwent a thoracentesis on 1/24 and 1/29. Analysis of the fluid indicated it was exudative in nature. Cytology, gram stain, cultures, and Acid-fast have been negative on both samples.   Over the interim the patient is very frustrated and reluctant to the thought that this could all be caused by TB. We discussed starting empiric treatment and contacting the health department to get the patient access to his medications. Although he is reluctant he voices understanding and has several questions.   Abx: Levauquin 1/21 -> completed 7 day course Ceftriaxone/Azithro -> 1/28  Pending Labs: - Acid fast sputum cultures pending (1/28 and 1/29) - ADA  Pertinent Labs: - HIV negative  - Hepatitis A, B, and C negative  - Autoimmune negative (ANA, RF, ANCA) - Strep pneumo and legionella urine antigen negative  -  Blood cultures on 1/21 NGTD - Quantiferon negative but PPD >15 mm (even if patient had BCG vaccine this is still a positive result)  Review of Systems: Review of Systems  Constitutional: Negative for chills, fever, malaise/fatigue and weight loss.  Eyes: Negative for  blurred vision, pain and discharge.  Respiratory: Positive for cough and shortness of breath. Negative for hemoptysis and sputum production.   Cardiovascular: Positive for chest pain. Negative for orthopnea.  Gastrointestinal: Negative for abdominal pain, diarrhea, nausea and vomiting.  Genitourinary: Negative for hematuria.  Musculoskeletal: Negative for myalgias and neck pain.  Skin: Negative for itching and rash.  Neurological: Negative for dizziness, tingling and headaches.  Endo/Heme/Allergies: Does not bruise/bleed easily.   Past Medical History:  Diagnosis Date  . Hypertension    Social History   Tobacco Use  . Smoking status: Never Smoker  . Smokeless tobacco: Never Used  Substance Use Topics  . Alcohol use: No    Frequency: Never  . Drug use: No   No family history on file. No Known Allergies  OBJECTIVE: Blood pressure 122/79, pulse (!) 101, temperature 98.4 F (36.9 C), temperature source Oral, resp. rate 16, height 5\' 8"  (1.727 m), weight 194 lb 9.6 oz (88.3 kg), SpO2 98 %.  Physical Exam  Constitutional: He is oriented to person, place, and time and well-developed, well-nourished, and in no distress.  HENT:  Head: Normocephalic and atraumatic.  Neck: Normal range of motion. Neck supple.  Cardiovascular: Normal rate and regular rhythm. Exam reveals no friction rub.  No murmur heard. Pulmonary/Chest: Effort normal and breath sounds normal.  Abdominal: Soft. Bowel sounds are normal. He exhibits no distension. There is no tenderness.  Neurological: He is alert and oriented to person, place, and time.  Skin: Skin is warm and dry. No rash noted.   Lab Results Lab Results  Component Value Date   WBC 14.7 (H) 11/27/2017   HGB 11.8 (L) 11/27/2017   HCT 36.3 (L) 11/27/2017   MCV 77.1 (L) 11/27/2017   PLT 463 (H) 11/27/2017    Lab Results  Component Value Date   CREATININE 0.95 11/26/2017   BUN 12 11/26/2017   NA 137 11/26/2017   K 4.3 11/26/2017   CL 102  11/26/2017   CO2 24 11/26/2017    Lab Results  Component Value Date   ALT 94 (H) 11/26/2017   AST 42 (H) 11/26/2017   ALKPHOS 156 (H) 11/26/2017   BILITOT 0.6 11/26/2017    Microbiology: Recent Results (from the past 240 hour(s))  Gram stain     Status: None   Collection Time: 11/18/17 12:48 PM  Result Value Ref Range Status   Specimen Description PLEURAL LEFT  Final   Special Requests NONE  Final   Gram Stain   Final    FEW WBC PRESENT, PREDOMINANTLY MONONUCLEAR NO ORGANISMS SEEN    Report Status 11/18/2017 FINAL  Final  Culture, body fluid-bottle     Status: None   Collection Time: 11/18/17 12:48 PM  Result Value Ref Range Status   Specimen Description PLEURAL LEFT  Final   Special Requests NONE  Final   Culture NO GROWTH 5 DAYS  Final   Report Status 11/23/2017 FINAL  Final  Culture, expectorated sputum-assessment     Status: None   Collection Time: 11/23/17 10:40 AM  Result Value Ref Range Status   Specimen Description SPUTUM  Final   Special Requests Normal  Final   Sputum evaluation   Final    Sputum  specimen not acceptable for testing.  Please recollect.   RESULT CALLED TO, READ BACK BY AND VERIFIED WITH: Sheffield Slider RN 11/24/17 0446 JDW    Report Status 11/25/2017 FINAL  Final  Acid Fast Smear (AFB)     Status: None   Collection Time: 11/23/17 10:40 AM  Result Value Ref Range Status   AFB Specimen Processing Concentration  Final   Acid Fast Smear Negative  Final    Comment: (NOTE) Performed At: [facility] [address] [place], [place] [id number] Jolene Schimke MD WU:[id number]    Source (AFB) SPUTUM  Final  Gram stain     Status: None   Collection Time: 11/24/17 12:29 AM  Result Value Ref Range Status   Specimen Description FLUID PLEURAL LEFT  Final   Special Requests NONE  Final   Gram Stain   Final    FEW WBC PRESENT, PREDOMINANTLY PMN NO ORGANISMS SEEN    Report Status 11/25/2017 FINAL  Final  Acid Fast Smear (AFB)     Status: None    Collection Time: 11/24/17 12:29 AM  Result Value Ref Range Status   AFB Specimen Processing Concentration  Final   Acid Fast Smear Negative  Final    Comment: (NOTE) Performed At: [facility] [address] [place], [place] [id number] Jolene Schimke MD VH:[id number]    Source (AFB) FLUID  Final    Comment: PLEURAL LEFT   Acid Fast Smear (AFB)     Status: None   Collection Time: 11/24/17  6:42 AM  Result Value Ref Range Status   AFB Specimen Processing Concentration  Final   Acid Fast Smear Negative  Final    Comment: (NOTE) Performed At: [facility] [address] [place], [place] [id number] Jolene Schimke MD UV:[id number]    Source (AFB) EXPECTORATED SPUTUM  Final   Levora Dredge, MD [facility] [phone] pager   [phone] cell 11/27/2017, 9:53 AM

## 2017-11-28 ENCOUNTER — Inpatient Hospital Stay (HOSPITAL_COMMUNITY): Payer: Self-pay

## 2017-11-28 LAB — BASIC METABOLIC PANEL
Anion gap: 10 (ref 5–15)
BUN: 18 mg/dL (ref 6–20)
CHLORIDE: 102 mmol/L (ref 101–111)
CO2: 24 mmol/L (ref 22–32)
CREATININE: 1.13 mg/dL (ref 0.61–1.24)
Calcium: 8.6 mg/dL — ABNORMAL LOW (ref 8.9–10.3)
GFR calc Af Amer: >60 mL/min (ref >60)
GFR calc non Af Amer: >60 mL/min (ref >60)
GLUCOSE: 115 mg/dL — AB (ref 65–99)
POTASSIUM: 4.2 mmol/L (ref 3.5–5.1)
Sodium: 136 mmol/L (ref 135–145)

## 2017-11-28 MED ORDER — ENOXAPARIN SODIUM 40 MG/0.4ML ~~LOC~~ SOLN
40.0000 mg | SUBCUTANEOUS | Status: DC
  Administered 2017-11-28 – 2017-12-03 (×3): 40 mg via SUBCUTANEOUS
  Filled 2017-11-28 (×6): qty 0.4

## 2017-11-28 MED ORDER — OXYCODONE HCL 5 MG PO TABS
5.0000 mg | ORAL_TABLET | Freq: Four times a day (QID) | ORAL | Status: DC | PRN
  Administered 2017-11-28 – 2017-12-03 (×6): 5 mg via ORAL
  Filled 2017-11-28 (×6): qty 1

## 2017-11-28 MED ORDER — MORPHINE SULFATE (PF) 2 MG/ML IV SOLN
1.0000 mg | INTRAVENOUS | Status: DC | PRN
  Administered 2017-11-30: 1 mg via INTRAVENOUS
  Filled 2017-11-28: qty 1

## 2017-11-28 NOTE — Progress Notes (Signed)
Pt with many questions regarding TB status. Pt questioning why his urine has turned orange and why he had started having chest pain 2 hours administration of TB meds. VS and EKG within normal limits. Oxy IR given with minimal relief. Ibuprofen given, then pt's chest pain reduced to 0. Pt educated about TB meds and side effects. Medicine information packets printed and given to patient. Will continue to monitor.

## 2017-11-28 NOTE — Progress Notes (Signed)
PROGRESS NOTE    Scott Rowe  HCW:[id number] DOB: 12-26-1970 DOA: 11/16/2017 PCP: Patient, No Pcp Per    Brief Narrative: Scott Rowe is a 47 y.o. male with a history of HTN not on medications and asthma who presented to the ED with dyspnea, chest pain and productive cough. CT chest demonstrated L > R pleural effusions with compressive atelectasis vs. infiltrate with nonspecific enlarged LNs. He was hypoxic, started on levaquin and supplemental oxygen. Left thoracentesis of 1.4L on 1/23 showed exudate and repeat CTA ruled out PE. No definite mass noted. Cultures have remained negative to date. Cytology from effusion was negative. Thoracentesis repeated 1/29.      Assessment & Plan:   Principal Problem:   PNA (pneumonia) Active Problems:   Acute respiratory failure with hypoxia (HCC)   Hypertension   Pleural effusion   S/P thoracentesis   Abnormal transaminases   Extrapulmonary tuberculosis   1-Acute hypoxic Respiratory failure; secondary to PNA and pleural effusion.  Completed IV antibiotics.  He has had two thoracentesis.  Pulmonary following.  Gold quantiferon negative,  WBC at 14.   2-Pleural effusion, left exudative.  He has had thoracentesis times 2.  Fluids, WBC 2850, gram stain negative, culture negative FAB one of three negative Chest x ray today, with slightly recurrence of pleural effusion.   Autoimmune work up (RF weakly positive at 15.8, ANA neg, ESR grossly elevated at 70),  On IV ceftriaxone and azithromycin. Finished treatment, discontinue by ID>  ADA pending.  ID consulted, due to positive PPD. Dr Tommy Medal is recommending tx for TB  Patient complaining today of worsening chest pain and dyspnea. Repeated chest x ray witn increase size pleural effusion, moderate size.  Discussed with Dr Lamonte Sakai, he recommend repeat chest x ray on Monday if worse might need repeat thoracentesis, patient has only received one day of treatment.   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    Transaminases;  Increasing. Change Vicodin to oxycodone. Avoid tylenol.  Hepatitis panel negative.  Stable.    Paroxysmal atrial flutter: Echocardiogram shows normal atria. TSH 1.481.  Started ASA for CHA2DS2-VASc of 1 (HTN). On metoprolol and Cardizem.   Pericardial effusion: Small, not hemodynamically significant. No rub. Stable on serial CT's.     DVT prophylaxis: SCD Code Status: full code.  Family Communication: care discussed with patient  Disposition Plan: to be determine  Consultants:   Pulmonology   Procedures:  Thoracentesis.    Antimicrobials: ceftriaxone and azithromycin,      Subjective: He is complaining of worsening  chest pain and dyspnea.   Objective: Vitals:   11/27/17 0856 11/27/17 1947 11/28/17 0639 11/28/17 1151  BP: 122/79 133/83 121/89 104/79  Pulse: (!) 101 99 95 91  Resp: 16 18 18 18   Temp: 98.4 F (36.9 C) 99.5 F (37.5 C) 98.7 F (37.1 C) 98.4 F (36.9 C)  TempSrc: Oral Oral Oral Oral  SpO2: 98% 99% 99% 98%  Weight:   87.7 kg (193 lb 4.8 oz)   Height:        Intake/Output Summary (Last 24 hours) at 11/28/2017 1359 Last data filed at 11/28/2017 1054 Gross per 24 hour  Intake 240 ml  Output 650 ml  Net -410 ml   Filed Weights   11/26/17 0545 11/27/17 0623 11/28/17 0639  Weight: 88.3 kg (194 lb 9.6 oz) 88.3 kg (194 lb 9.6 oz) 87.7 kg (193 lb 4.8 oz)    Examination:  General exam:NAD Respiratory system: Normal respiratory effort, CTA Cardiovascular system:  S 1, S 2 RRR Gastrointestinal system: BS present, soft, nt Central nervous system: Non focal.  Extremities: Symmetric power.  Skin: no rash.    Data Reviewed: I have personally reviewed following labs and imaging studies  CBC: Recent Labs  Lab 11/22/17 0311 11/23/17 0833 11/25/17 0517 11/26/17 0618 11/27/17 0842  WBC 14.2* 15.2* 12.7* 14.1* 14.7*  HGB 11.4* 12.1* 12.1* 11.9* 11.8*  HCT 34.9* 36.9* 37.1* 36.5* 36.3*  MCV 77.0* 76.7* 77.1* 77.0* 77.1*   PLT 427* 485* 439* 453* 454*   Basic Metabolic Panel: Recent Labs  Lab 11/25/17 0517 11/26/17 0618 11/28/17 0941  NA 136 137 136  K 4.3 4.3 4.2  CL 100* 102 102  CO2 25 24 24   GLUCOSE 107* 100* 115*  BUN 15 12 18   CREATININE 0.99 0.95 1.13  CALCIUM 8.3* 8.4* 8.6*   GFR: Estimated Creatinine Clearance: 88.9 mL/min (by C-G formula based on SCr of 1.13 mg/dL). Liver Function Tests: Recent Labs  Lab 11/25/17 0517 11/26/17 0618  AST 57* 42*  ALT 107* 94*  ALKPHOS 170* 156*  BILITOT 0.6 0.6  PROT 7.0 7.1  ALBUMIN 2.4* 2.3*   No results for input(s): LIPASE, AMYLASE in the last 168 hours. No results for input(s): AMMONIA in the last 168 hours. Coagulation Profile: No results for input(s): INR, PROTIME in the last 168 hours. Cardiac Enzymes: No results for input(s): CKTOTAL, CKMB, CKMBINDEX, TROPONINI in the last 168 hours. BNP (last 3 results) No results for input(s): PROBNP in the last 8760 hours. HbA1C: No results for input(s): HGBA1C in the last 72 hours. CBG: No results for input(s): GLUCAP in the last 168 hours. Lipid Profile: No results for input(s): CHOL, HDL, LDLCALC, TRIG, CHOLHDL, LDLDIRECT in the last 72 hours. Thyroid Function Tests: No results for input(s): TSH, T4TOTAL, FREET4, T3FREE, THYROIDAB in the last 72 hours. Anemia Panel: No results for input(s): VITAMINB12, FOLATE, FERRITIN, TIBC, IRON, RETICCTPCT in the last 72 hours. Sepsis Labs: No results for input(s): PROCALCITON, LATICACIDVEN in the last 168 hours.  Recent Results (from the past 240 hour(s))  Culture, expectorated sputum-assessment     Status: None   Collection Time: 11/23/17 10:40 AM  Result Value Ref Range Status   Specimen Description SPUTUM  Final   Special Requests Normal  Final   Sputum evaluation   Final    Sputum specimen not acceptable for testing.  Please recollect.   RESULT CALLED TO, READ BACK BY AND VERIFIED WITH: L WORLEY RN 11/24/17 0446 JDW    Report Status  11/25/2017 FINAL  Final  Acid Fast Smear (AFB)     Status: None   Collection Time: 11/23/17 10:40 AM  Result Value Ref Range Status   AFB Specimen Processing Concentration  Final   Acid Fast Smear Negative  Final    Comment: (NOTE) Performed At: [facility] [place], [place] [id number] Rush Farmer MD WG:[id number]    Source (AFB) SPUTUM  Final  Gram stain     Status: None   Collection Time: 11/24/17 12:29 AM  Result Value Ref Range Status   Specimen Description FLUID PLEURAL LEFT  Final   Special Requests NONE  Final   Gram Stain   Final    FEW WBC PRESENT, PREDOMINANTLY PMN NO ORGANISMS SEEN    Report Status 11/25/2017 FINAL  Final  Acid Fast Smear (AFB)     Status: None   Collection Time: 11/24/17 12:29 AM  Result Value Ref Range Status   AFB Specimen  Processing Concentration  Final   Acid Fast Smear Negative  Final    Comment: (NOTE) Performed At: [facility] [place], [place] [id number] Rush Farmer MD YW:[id number]    Source (AFB) FLUID  Final    Comment: PLEURAL LEFT   Acid Fast Smear (AFB)     Status: None   Collection Time: 11/24/17  6:42 AM  Result Value Ref Range Status   AFB Specimen Processing Concentration  Final   Acid Fast Smear Negative  Final    Comment: (NOTE) Performed At: [facility] [place], [place] [id number] Rush Farmer MD KK:[id number]    Source (AFB) EXPECTORATED SPUTUM  Final         Radiology Studies: Dg Chest Port 1 View  Result Date: 11/28/2017 CLINICAL DATA:  Dyspnea. EXAM: PORTABLE CHEST 1 VIEW COMPARISON:  11/26/2017 FINDINGS: Cardiomediastinal silhouette is enlarged. There is persistent left lower lobe airspace consolidation with moderate in size left pleural effusion. Small right subpulmonic pleural effusion cannot be excluded. No evidence of pneumothorax. Osseous structures are without acute abnormality. Soft tissues are grossly normal. IMPRESSION:  Persistent left lower lobe airspace consolidation and moderate in size left pleural effusion. Probable small subpulmonic right pleural effusion. Enlarged cardiac silhouette. Electronically Signed   By: Fidela Salisbury M.D.   On: 11/28/2017 09:35   US Abdomen Limited Ruq  Result Date: 11/26/2017 CLINICAL DATA:  Elevated LFTs EXAM: ULTRASOUND ABDOMEN LIMITED RIGHT UPPER QUADRANT COMPARISON:  None. FINDINGS: Gallbladder: No gallstones or wall thickening visualized. No sonographic Murphy sign noted by sonographer. Common bile duct: Diameter: 4.3 mm Liver: No focal lesion identified. Within normal limits in parenchymal echogenicity. Portal vein is patent on color Doppler imaging with normal direction of blood flow towards the liver. IMPRESSION: Normal study.  No cause for elevated LFTs identified. Electronically Signed   By: Dorise Bullion III M.D   On: 11/26/2017 20:18        Scheduled Meds: . aspirin EC  81 mg Oral Daily  . diltiazem  120 mg Oral BID  . enoxaparin (LOVENOX) injection  40 mg Subcutaneous Q24H  . ethambutol  1,600 mg Oral Daily  . isoniazid  300 mg Oral Daily  . metoprolol tartrate  100 mg Oral BID  . pyrazinamide  2,000 mg Oral Daily  . vitamin B-6  50 mg Oral Daily  . rifampin  600 mg Oral Daily  . sodium chloride flush  3 mL Intravenous Q12H   Continuous Infusions: . sodium chloride       LOS: 12 days    Time spent: 35 minutes.     Elmarie Shiley, MD [facility] Pager [phone]  If 7PM-7AM, please contact night-coverage [email or web address] Password [facility] 11/28/2017, 1:59 PM

## 2017-11-29 LAB — CBC
HEMATOCRIT: 35.1 % — AB (ref 39.0–52.0)
HEMOGLOBIN: 11.2 g/dL — AB (ref 13.0–17.0)
MCH: 24.7 pg — ABNORMAL LOW (ref 26.0–34.0)
MCHC: 31.9 g/dL (ref 30.0–36.0)
MCV: 77.3 fL — ABNORMAL LOW (ref 78.0–100.0)
Platelets: 487 10*3/uL — ABNORMAL HIGH (ref 150–400)
RBC: 4.54 MIL/uL (ref 4.22–5.81)
RDW: 13.7 % (ref 11.5–15.5)
WBC: 14.5 10*3/uL — AB (ref 4.0–10.5)

## 2017-11-29 MED ORDER — DEXTROSE 5 % IV SOLN
1.0000 g | INTRAVENOUS | Status: DC
  Administered 2017-11-29: 1 g via INTRAVENOUS
  Filled 2017-11-29 (×2): qty 10

## 2017-11-29 NOTE — Progress Notes (Addendum)
PROGRESS NOTE    Scott Rowe  KPT:[id number] DOB: 06/21/1971 DOA: 11/16/2017 PCP: Patient, No Pcp Per    Brief Narrative: Scott Rowe is a 47 y.o. male with a history of HTN not on medications and asthma who presented to the ED with dyspnea, chest pain and productive cough. CT chest demonstrated L > R pleural effusions with compressive atelectasis vs. infiltrate with nonspecific enlarged LNs. He was hypoxic, started on levaquin and supplemental oxygen. Left thoracentesis of 1.4L on 1/23 showed exudate and repeat CTA ruled out PE. No definite mass noted. Cultures have remained negative to date. Cytology from effusion was negative. Thoracentesis repeated 1/29.      Assessment & Plan:   Principal Problem:   PNA (pneumonia) Active Problems:   Acute respiratory failure with hypoxia (HCC)   Hypertension   Pleural effusion   S/P thoracentesis   Abnormal transaminases   Extrapulmonary tuberculosis   1-Acute hypoxic Respiratory failure; secondary to PNA and pleural effusion.  Received 5 days  IV antibiotics until 2-03 He has had two thoracentesis.  Pulmonary following.  Gold quantiferon negative WBC at 14.  Spike fever, check blood culture, repeat cbc.  Will resume ceftriaxone  and will discussed with ID tomorrow.   2-Pleural effusion, left exudative.  He has had thoracentesis times 2.  Fluids, WBC 2850, gram stain negative, culture negative FAB one of three negative Chest x ray today, with slightly recurrence of pleural effusion.   Autoimmune work up (RF weakly positive at 15.8, ANA neg, ESR grossly elevated at 70),  On IV ceftriaxone and azithromycin. Finished treatment, discontinue by ID>  ADA pending.  ID consulted, due to positive PPD. Dr Tommy Medal is recommending tx for TB  Patient complaining today of worsening chest pain and dyspnea. Repeated chest x ray witn increase size pleural effusion, moderate size.  Discussed with Dr Lamonte Sakai, he recommend repeat chest x ray , if worse  might need repeat thoracentesis, patient has only received one day of treatment.  Will repeat chest x ray Monday or Tuesday , will discussed with ID.   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  Getting treatment for tb  Transaminases;  Increasing. Change Vicodin to oxycodone. Avoid tylenol.  Hepatitis panel negative.  Stable.    Paroxysmal atrial flutter: Echocardiogram shows normal atria. TSH 1.481.  Started ASA for CHA2DS2-VASc of 1 (HTN). On metoprolol and Cardizem.   Pericardial effusion: Small, not hemodynamically significant. No rub. Stable on serial CT's.     DVT prophylaxis: SCD Code Status: full code.  Family Communication: care discussed with patient  Disposition Plan: to be determine  Consultants:   Pulmonology   Procedures:  Thoracentesis.    Antimicrobials: ceftriaxone and azithromycin,      Subjective: He report chest pain is stable , not worse. He is breathing well. He doesn't think he has TB  Objective: Vitals:   11/28/17 1151 11/28/17 2035 11/29/17 0538 11/29/17 1239  BP: 104/79 110/64 114/79 109/80  Pulse: 91 (!) 104 (!) 102 91  Resp: 18 18 18 20   Temp: 98.4 F (36.9 C) 99.6 F (37.6 C) 99 F (37.2 C) (!) 100.6 F (38.1 C)  TempSrc: Oral Oral Oral Oral  SpO2: 98% 98% 98% 97%  Weight:   86.6 kg (191 lb)   Height:        Intake/Output Summary (Last 24 hours) at 11/29/2017 1252 Last data filed at 11/29/2017 1241 Gross per 24 hour  Intake 1083 ml  Output 1500 ml  Net -417 ml  Filed Weights   11/27/17 0623 11/28/17 0639 11/29/17 0538  Weight: 88.3 kg (194 lb 9.6 oz) 87.7 kg (193 lb 4.8 oz) 86.6 kg (191 lb)    Examination:  General exam:NAD Respiratory system: Normal respiratory effort, crackles left  Cardiovascular system: S 1, S 2 RRR Gastrointestinal system: BS present, soft, nt Central nervous system: non focal.  Extremities: =symmetric power Skin: no rash.    Data Reviewed: I have personally reviewed following labs and imaging  studies  CBC: Recent Labs  Lab 11/23/17 0833 11/25/17 0517 11/26/17 0618 11/27/17 0842  WBC 15.2* 12.7* 14.1* 14.7*  HGB 12.1* 12.1* 11.9* 11.8*  HCT 36.9* 37.1* 36.5* 36.3*  MCV 76.7* 77.1* 77.0* 77.1*  PLT 485* 439* 453* 979*   Basic Metabolic Panel: Recent Labs  Lab 11/25/17 0517 11/26/17 0618 11/28/17 0941  NA 136 137 136  K 4.3 4.3 4.2  CL 100* 102 102  CO2 25 24 24   GLUCOSE 107* 100* 115*  BUN 15 12 18   CREATININE 0.99 0.95 1.13  CALCIUM 8.3* 8.4* 8.6*   GFR: Estimated Creatinine Clearance: 88.4 mL/min (by C-G formula based on SCr of 1.13 mg/dL). Liver Function Tests: Recent Labs  Lab 11/25/17 0517 11/26/17 0618  AST 57* 42*  ALT 107* 94*  ALKPHOS 170* 156*  BILITOT 0.6 0.6  PROT 7.0 7.1  ALBUMIN 2.4* 2.3*   No results for input(s): LIPASE, AMYLASE in the last 168 hours. No results for input(s): AMMONIA in the last 168 hours. Coagulation Profile: No results for input(s): INR, PROTIME in the last 168 hours. Cardiac Enzymes: No results for input(s): CKTOTAL, CKMB, CKMBINDEX, TROPONINI in the last 168 hours. BNP (last 3 results) No results for input(s): PROBNP in the last 8760 hours. HbA1C: No results for input(s): HGBA1C in the last 72 hours. CBG: No results for input(s): GLUCAP in the last 168 hours. Lipid Profile: No results for input(s): CHOL, HDL, LDLCALC, TRIG, CHOLHDL, LDLDIRECT in the last 72 hours. Thyroid Function Tests: No results for input(s): TSH, T4TOTAL, FREET4, T3FREE, THYROIDAB in the last 72 hours. Anemia Panel: No results for input(s): VITAMINB12, FOLATE, FERRITIN, TIBC, IRON, RETICCTPCT in the last 72 hours. Sepsis Labs: No results for input(s): PROCALCITON, LATICACIDVEN in the last 168 hours.  Recent Results (from the past 240 hour(s))  Culture, expectorated sputum-assessment     Status: None   Collection Time: 11/23/17 10:40 AM  Result Value Ref Range Status   Specimen Description SPUTUM  Final   Special Requests Normal   Final   Sputum evaluation   Final    Sputum specimen not acceptable for testing.  Please recollect.   RESULT CALLED TO, READ BACK BY AND VERIFIED WITH: Chana Bode RN 11/24/17 0446 JDW    Report Status 11/25/2017 FINAL  Final  Acid Fast Smear (AFB)     Status: None   Collection Time: 11/23/17 10:40 AM  Result Value Ref Range Status   AFB Specimen Processing Concentration  Final   Acid Fast Smear Negative  Final    Comment: (NOTE) Performed At: [facility] [place], [place] [id number] Rush Farmer MD [id number]    Source (AFB) SPUTUM  Final  Gram stain     Status: None   Collection Time: 11/24/17 12:29 AM  Result Value Ref Range Status   Specimen Description FLUID PLEURAL LEFT  Final   Special Requests NONE  Final   Gram Stain   Final    FEW WBC PRESENT, PREDOMINANTLY PMN NO ORGANISMS SEEN  Report Status 11/25/2017 FINAL  Final  Acid Fast Smear (AFB)     Status: None   Collection Time: 11/24/17 12:29 AM  Result Value Ref Range Status   AFB Specimen Processing Concentration  Final   Acid Fast Smear Negative  Final    Comment: (NOTE) Performed At: [facility] [place], [place] [id number] Rush Farmer MD UR:[id number]    Source (AFB) FLUID  Final    Comment: PLEURAL LEFT   Acid Fast Smear (AFB)     Status: None   Collection Time: 11/24/17  6:42 AM  Result Value Ref Range Status   AFB Specimen Processing Concentration  Final   Acid Fast Smear Negative  Final    Comment: (NOTE) Performed At: [facility] [place], [place] [id number] Rush Farmer MD WV:[id number]    Source (AFB) EXPECTORATED SPUTUM  Final         Radiology Studies: Dg Chest Port 1 View  Result Date: 11/28/2017 CLINICAL DATA:  Dyspnea. EXAM: PORTABLE CHEST 1 VIEW COMPARISON:  11/26/2017 FINDINGS: Cardiomediastinal silhouette is enlarged. There is persistent left lower lobe airspace consolidation with moderate in size left  pleural effusion. Small right subpulmonic pleural effusion cannot be excluded. No evidence of pneumothorax. Osseous structures are without acute abnormality. Soft tissues are grossly normal. IMPRESSION: Persistent left lower lobe airspace consolidation and moderate in size left pleural effusion. Probable small subpulmonic right pleural effusion. Enlarged cardiac silhouette. Electronically Signed   By: Fidela Salisbury M.D.   On: 11/28/2017 09:35        Scheduled Meds: . aspirin EC  81 mg Oral Daily  . diltiazem  120 mg Oral BID  . enoxaparin (LOVENOX) injection  40 mg Subcutaneous Q24H  . ethambutol  1,600 mg Oral Daily  . isoniazid  300 mg Oral Daily  . metoprolol tartrate  100 mg Oral BID  . pyrazinamide  2,000 mg Oral Daily  . vitamin B-6  50 mg Oral Daily  . rifampin  600 mg Oral Daily  . sodium chloride flush  3 mL Intravenous Q12H   Continuous Infusions: . sodium chloride       LOS: 13 days    Time spent: 35 minutes.     Elmarie Shiley, MD [facility] Pager [phone]  If 7PM-7AM, please contact night-coverage [email or web address] Password [email or web address] 11/29/2017, 12:52 PM

## 2017-11-30 ENCOUNTER — Inpatient Hospital Stay (HOSPITAL_COMMUNITY): Payer: Self-pay

## 2017-11-30 DIAGNOSIS — J90 Pleural effusion, not elsewhere classified: Secondary | ICD-10-CM

## 2017-11-30 DIAGNOSIS — R0781 Pleurodynia: Secondary | ICD-10-CM

## 2017-11-30 LAB — MISC LABCORP TEST (SEND OUT): Labcorp test code: 2006096

## 2017-11-30 NOTE — Consult Note (Signed)
[address]       [place] [address]             [phone]        Rohin Krejci [facility] Medical Record #[id number] Date of Birth: September 27, 1971  Referring: Dr. Hartley Barefoot, MD Primary Care: Patient, No Pcp Per  Chief Complaint:    Chief Complaint  Patient presents with  . Chest Pain  . Shortness of Breath   Reason for consultation: Recurrent left pleural effusion , 3 rd week in hospital   History of Present Illness:     This is a 47 year old male with a past medical history of hypertension and recently diagnosed with TB (on RIPE) who presented to the ED on 11/16/2017 with complaints of shortness of breath and chest pain He has had multiple chest x rays and CT scans of the chest. He was found to have a left pleural effusion. IR did a left thoracentesis on 01/23 and approximately 1.4 liters of fluid was removed. The left pleural effusion recurred and he underwent another left thoracentesis on 01/29 and approximately 800 mL was removed. Questionable if etiology of the recurrent left pleural effusion is para pneumonic. Patient has received 5 days of antibiotics (Ceftriaxone and Azithromycin). CHF as etiology of recurrent left pleural effusion is unlikely as echo done 01/23 showed LVEF 65-70%, trivial pericardial effusion, no significant valvular disease. Cultures are negative to date.   Chest x ray done today shows persistent left pleural effusion with atelectasis, enlargement of the cardiac silhouette, and trace right pleural effusion. Ct surgery  has been consulted regarding biopsy . Of note, pulmonary had been following patient on this admission as well.  Current Activity/ Functional Status: Patient is independent with mobility/ambulation, transfers, ADL's, IADL's.   Zubrod Score: At the time of surgery this patient's most appropriate activity status/level should be described as: []     0    Normal activity, no symptoms [x]     1    Restricted in physical  strenuous activity but ambulatory, able to do out light work []     2    Ambulatory and capable of self care, unable to do work activities, up and about                 more than 50%  Of the time                            []     3    Only limited self care, in bed greater than 50% of waking hours []     4    Completely disabled, no self care, confined to bed or chair []     5    Moribund  Past Medical History:  Diagnosis Date  . Hypertension     Past Surgical History:  Procedure Laterality Date  . IR THORACENTESIS ASP PLEURAL SPACE W/IMG GUIDE  11/18/2017  . IR THORACENTESIS ASP PLEURAL SPACE W/IMG GUIDE  11/24/2017    Social History   Socioeconomic History  . Marital status: Single    Spouse name: Not on file  . 1 son who lives with his mother   Social Needs  . Financial resource strain: Not on file  . Food insecurity - worry: Not on file  . Food insecurity - inability: Not on file  . Transportation needs - medical: Not on file  . Transportation needs - non-medical: Not  on file  Occupational History  . He works at a [employer]  Tobacco Use  . Smoking status: Never Smoker  . Smokeless tobacco: Never Used  Substance and Sexual Activity  . Alcohol use: No    Frequency: Never  . Drug use: No  . Sexual activity: Not on file   Family History: Both his mother and father live in [place] and they are alive. The only health problem he knows of is hypertension.  Allergies: No Known Allergies  Current Facility-Administered Medications  Medication Dose Route Frequency Provider Last Rate Last Dose  . 0.9 %  sodium chloride infusion  250 mL Intravenous PRN Tarry Kos A, MD      . acetaminophen (TYLENOL) tablet 650 mg  650 mg Oral Q6H PRN Tyrone Nine, MD   650 mg at 11/30/17 1610   Or  . acetaminophen (TYLENOL) suppository 650 mg  650 mg Rectal Q6H PRN Tyrone Nine, MD      . aspirin EC tablet 81 mg  81 mg Oral Daily Tyrone Nine, MD   81 mg at 11/30/17 1031  .  diltiazem (CARDIZEM CD) 24 hr capsule 120 mg  120 mg Oral BID Orpah Cobb, MD   120 mg at 11/30/17 1030  . enoxaparin (LOVENOX) injection 40 mg  40 mg Subcutaneous Q24H Regalado, Belkys A, MD   40 mg at 11/30/17 1032  . ethambutol (MYAMBUTOL) tablet 1,600 mg  1,600 mg Oral Daily Helberg, Jill Alexanders, MD   1,600 mg at 11/30/17 1032  . hydrALAZINE (APRESOLINE) injection 10 mg  10 mg Intravenous Q2H PRN Tyrone Nine, MD   10 mg at 11/19/17 9604  . ibuprofen (ADVIL,MOTRIN) tablet 800 mg  800 mg Oral Q6H PRN Simonne Martinet, NP   800 mg at 11/28/17 1813  . isoniazid (NYDRAZID) tablet 300 mg  300 mg Oral Daily Helberg, Jill Alexanders, MD   300 mg at 11/30/17 1031  . lidocaine (XYLOCAINE) 1 % (with pres) injection    PRN Hoyt Koch, PA   10 mL at 11/24/17 1331  . menthol-cetylpyridinium (CEPACOL) lozenge 3 mg  1 lozenge Oral PRN Hazeline Junker B, MD      . metoprolol tartrate (LOPRESSOR) tablet 100 mg  100 mg Oral BID Tyrone Nine, MD   100 mg at 11/30/17 1031  . morphine 2 MG/ML injection 1 mg  1 mg Intravenous Q4H PRN Regalado, Belkys A, MD   1 mg at 11/30/17 0213  . ondansetron (ZOFRAN) tablet 4 mg  4 mg Oral Q6H PRN Haydee Monica, MD       Or  . ondansetron [facility]) injection 4 mg  4 mg Intravenous Q6H PRN Tarry Kos A, MD      . oxyCODONE (Oxy IR/ROXICODONE) immediate release tablet 5 mg  5 mg Oral Q6H PRN Regalado, Belkys A, MD   5 mg at 11/30/17 0823  . pyrazinamide tablet 2,000 mg  2,000 mg Oral Daily Levora Dredge, MD   2,000 mg at 11/30/17 1031  . pyridOXINE (VITAMIN B-6) tablet 50 mg  50 mg Oral Daily Levora Dredge, MD   50 mg at 11/30/17 1032  . rifampin (RIFADIN) capsule 600 mg  600 mg Oral Daily Helberg, Justin, MD   600 mg at 11/30/17 1030  . sodium chloride flush (NS) 0.9 % injection 3 mL  3 mL Intravenous Q12H Tarry Kos A, MD   3 mL at 11/30/17 1033  . sodium chloride flush (NS) 0.9 % injection 3  mL  3 mL Intravenous PRN Haydee Monica, MD        Medications Prior to  Admission  Medication Sig Dispense Refill Last Dose  . acetaminophen (TYLENOL) 500 MG tablet Take 500 mg by mouth every 6 (six) hours as needed for mild pain.   Past Week at Unknown time  . Aspirin-Acetaminophen-Caffeine (GOODY HEADACHE PO) Take 1 packet by mouth every 6 (six) hours as needed (pain).   Past Week at Unknown time  . vitamin C (ASCORBIC ACID) 500 MG tablet Take 500 mg by mouth daily.   11/15/2017 at ty   Review of Systems:    Cardiac Review of Systems: [Y]= Yes or  [  N  ]= no  Exertional SOB  [ Y ]  Orthopnea Klaus.Mock  ]   Pedal Edema [ N  ]    Palpitations [  ] Syncope  [ N ]   Presyncope [ N  ]  General Review of Systems: [Y] = yes [ N ]=no Constitional: nausea [ N ]; night sweats Klaus.Mock  ]; fever [  N]; or chills [ N ]                                                                Eye : blurred vision Klaus.Mock  ]; diplopia [  N ];  Resp: cough [ N ];  wheezing[N  ];  hemoptysis[N  ];   GI:  vomiting[N  ];  dysphagia[ N ]; melena[ N ];  hematochezia [ N ];  GU:  hematuria[ N ];               Skin: rash, swelling[N ]  Heme/Lymph:  anemia[ Y ];  Neuro: TIA[N  ];  ;  stroke[ N ];   seizures[ N ];  Endocrine: diabetes[ N ]      Physical Exam: BP 116/81 (BP Location: Right Arm)   Pulse 76   Temp 97.9 F (36.6 C) (Oral)   Resp 20   Ht 5\' 8"  (1.727 m)   Wt 194 lb 14.4 oz (88.4 kg) Comment: scale c  SpO2 100%   BMI 29.63 kg/m    General appearance: alert, cooperative and no distress Head: Normocephalic, without obvious abnormality, atraumatic Resp: Diminshed left base, right is mostly clear Cardio: IRRR GI: Soft, non tender, bowel sounds present Extremities: No LE edema;feet warm Neurologic: Grossly normal  Diagnostic Studies & Laboratory data:     Recent Radiology Findings:   Dg Chest 2 View  Result Date: 11/30/2017 CLINICAL DATA:  Hypertension, asthma, dyspnea, chest pain, productive cough, BILATERAL pleural effusions post LEFT thoracentesis EXAM: CHEST  2 VIEW COMPARISON:   11/28/2017 FINDINGS: Enlargement of cardiac silhouette. Mediastinal contours and pulmonary vascularity normal. Persistent LEFT pleural effusion and basilar opacity question atelectasis versus consolidation. Tiny RIGHT pleural effusion. Remaining lungs clear. No additional infiltrate or pneumothorax. Bones unremarkable. IMPRESSION: Persistent LEFT pleural effusion with atelectasis versus consolidation at LEFT base. Enlargement of cardiac silhouette with tiny RIGHT pleural effusion. Electronically Signed   By: Ulyses Southward M.D.   On: 11/30/2017 13:35     I have independently reviewed the above radiologic studies.  Recent Lab Findings: Lab Results  Component Value Date   WBC 14.5 (H) 11/29/2017   HGB 11.2 (  L) 11/29/2017   HCT 35.1 (L) 11/29/2017   PLT 487 (H) 11/29/2017   GLUCOSE 115 (H) 11/28/2017   ALT 94 (H) 11/26/2017   AST 42 (H) 11/26/2017   NA 136 11/28/2017   K 4.2 11/28/2017   CL 102 11/28/2017   CREATININE 1.13 11/28/2017   BUN 18 11/28/2017   CO2 24 11/28/2017   TSH 1.481 11/16/2017    Assessment / Plan:   1. Recurrent left pleural effusion-previous thoracenteses on 01/23 (1.4 liters of fluid removed) and 01/29 (800  Ml of fluid removed). Etiology may be PNA. So far cultures and cytology negative x2.  - CT scans reviewed  I think at this point VATS will have low yield to increase the information we have so far on etiology of effusion.  Will follow with you   2. 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 Acute respiratory failure with hypoxia-possibly secondary to pneumonia. He has been given 5 days of IV antibiotics, WBC 14,500 today, and past pleural cultures negative for growth. 4.Hypertension-on Lopressor 100 mg bid. 5. Paroxysmal atrial flutter-on Cardizem CD 120 mg daily. On ecasa 81 mg daily. Dr. Sharyn LullHarwani saw on 11/19/2017.  Delight OvensEdward B Kyler Lerette MD      [address] [place] [id number] Office [phone]   Beeper [phone]

## 2017-11-30 NOTE — Care Management Note (Signed)
Case Management Note  Patient Details  Name: Scott Rowe MRN: [id number] Date of Birth: 07/07/1971  Subjective/Objective:    Pneumonia/ TB              Action/Plan: Patient is independent prior to admission; works fulltime; he is to follow up with the Health Department after discharge; CM talked to [employer] WorldwideCarolyn Supervisor [facility] [employer] [phone]( [phone]); she is to be called when pt is discharged; he needs to received his TB medication the day of discharge and the Health Dept Nurse will visit the patient the following day to administer his medication. The Health Department has the legal authority and responsibility to coordinate all TB efforts in its jurisdiction.  Expected Discharge Date:    possibly 12/02/2017              Expected Discharge Plan:  Home/Self Care  Discharge planning Services  CM Consult  Status of Service:  In process, will continue to follow  Reola MosherChandler, [name], RN,MHA,BSN [phone] 11/30/2017, 10:30 AM

## 2017-11-30 NOTE — Progress Notes (Signed)
PROGRESS NOTE    Scott Rowe  OMB:[id number] DOB: Jan 12, 1971 DOA: 11/16/2017 PCP: Patient, No Pcp Per    Brief Narrative: Scott Rowe is a 47 y.o. male with a history of HTN not on medications and asthma who presented to the ED with dyspnea, chest pain and productive cough. CT chest demonstrated L > R pleural effusions with compressive atelectasis vs. infiltrate with nonspecific enlarged LNs. He was hypoxic, started on levaquin and supplemental oxygen. Left thoracentesis of 1.4L on 1/23 showed exudate and repeat CTA ruled out PE. No definite mass noted. Cultures have remained negative to date. Cytology from effusion was negative. Thoracentesis repeated 1/29.      Assessment & Plan:   Principal Problem:   PNA (pneumonia) Active Problems:   Acute respiratory failure with hypoxia (HCC)   Hypertension   Pleural effusion   S/P thoracentesis   Abnormal transaminases   Extrapulmonary tuberculosis   1-Acute hypoxic Respiratory failure; secondary to PNA and pleural effusion.  Received 5 days  IV antibiotics until 2-03 He has had two thoracentesis.  Pulmonary following.  Gold quantiferon negative WBC at 14.  Spike fever, check blood culture, repeat cbc.  Repeat chest x ray today,.   2-Pleural effusion, left exudative.  He has had thoracentesis times 2.  Fluids, WBC 2850, gram stain negative, culture negative FAB one of three negative Chest x ray today, with slightly recurrence of pleural effusion.   Autoimmune work up (RF weakly positive at 15.8, ANA neg, ESR grossly elevated at 70),  On IV ceftriaxone and azithromycin. Finished treatment, discontinue by ID>  ADA pending.  ID consulted, due to positive PPD. Dr Tommy Medal is recommending tx for TB  Patient complaining today of worsening chest pain and dyspnea. Repeated chest x ray witn increase size pleural effusion, moderate size.  Discussed with Dr Tommy Medal will ask CVTS evaluation for VATS and biopsy./  Per ID ADA negative   PPD  positive 20 mm; ID consulted.  Getting treatment for tb  Transaminases;  Increasing. Change Vicodin to oxycodone. Avoid tylenol.  Hepatitis panel negative.  Stable.    Paroxysmal atrial flutter: Echocardiogram shows normal atria. TSH 1.481.  Started ASA for CHA2DS2-VASc of 1 (HTN). On metoprolol and Cardizem.   Pericardial effusion: Small, not hemodynamically significant. No rub. Stable on serial CT's.     DVT prophylaxis: SCD Code Status: full code.  Family Communication: care discussed with patient  Disposition Plan: to be determine  Consultants:   Pulmonology   Procedures:  Thoracentesis.    Antimicrobials: ceftriaxone and azithromycin,      Subjective: He complaints of worsening pain two hours after he takes TB meds.    Objective: Vitals:   11/29/17 1958 11/30/17 0525 11/30/17 0923 11/30/17 1212  BP: 120/72 119/85 124/66 116/81  Pulse: 95 74 94 76  Resp: 20 20  20   Temp: 99.5 F (37.5 C) 98.9 F (37.2 C)  97.9 F (36.6 C)  TempSrc: Oral Oral  Oral  SpO2: 98% 100%  100%  Weight:  88.4 kg (194 lb 14.4 oz)    Height:        Intake/Output Summary (Last 24 hours) at 11/30/2017 1256 Last data filed at 11/30/2017 0945 Gross per 24 hour  Intake 770 ml  Output 700 ml  Net 70 ml   Filed Weights   11/28/17 0639 11/29/17 0538 11/30/17 0525  Weight: 87.7 kg (193 lb 4.8 oz) 86.6 kg (191 lb) 88.4 kg (194 lb 14.4 oz)    Examination:  General exam: NAD Respiratory system: Normal respiratory effort, decreased breath sound left.  Cardiovascular system: S 1, S 2 RRR Gastrointestinal system: BS present, soft,  Central nervous system: non focal.   Extremities: Symmetric power.  Skin:No rash.    Data Reviewed: I have personally reviewed following labs and imaging studies  CBC: Recent Labs  Lab 11/25/17 0517 11/26/17 0618 11/27/17 0842 11/29/17 1323  WBC 12.7* 14.1* 14.7* 14.5*  HGB 12.1* 11.9* 11.8* 11.2*  HCT 37.1* 36.5* 36.3* 35.1*  MCV 77.1*  77.0* 77.1* 77.3*  PLT 439* 453* 463* 017*   Basic Metabolic Panel: Recent Labs  Lab 11/25/17 0517 11/26/17 0618 11/28/17 0941  NA 136 137 136  K 4.3 4.3 4.2  CL 100* 102 102  CO2 25 24 24   GLUCOSE 107* 100* 115*  BUN 15 12 18   CREATININE 0.99 0.95 1.13  CALCIUM 8.3* 8.4* 8.6*   GFR: Estimated Creatinine Clearance: 89.2 mL/min (by C-G formula based on SCr of 1.13 mg/dL). Liver Function Tests: Recent Labs  Lab 11/25/17 0517 11/26/17 0618  AST 57* 42*  ALT 107* 94*  ALKPHOS 170* 156*  BILITOT 0.6 0.6  PROT 7.0 7.1  ALBUMIN 2.4* 2.3*   No results for input(s): LIPASE, AMYLASE in the last 168 hours. No results for input(s): AMMONIA in the last 168 hours. Coagulation Profile: No results for input(s): INR, PROTIME in the last 168 hours. Cardiac Enzymes: No results for input(s): CKTOTAL, CKMB, CKMBINDEX, TROPONINI in the last 168 hours. BNP (last 3 results) No results for input(s): PROBNP in the last 8760 hours. HbA1C: No results for input(s): HGBA1C in the last 72 hours. CBG: No results for input(s): GLUCAP in the last 168 hours. Lipid Profile: No results for input(s): CHOL, HDL, LDLCALC, TRIG, CHOLHDL, LDLDIRECT in the last 72 hours. Thyroid Function Tests: No results for input(s): TSH, T4TOTAL, FREET4, T3FREE, THYROIDAB in the last 72 hours. Anemia Panel: No results for input(s): VITAMINB12, FOLATE, FERRITIN, TIBC, IRON, RETICCTPCT in the last 72 hours. Sepsis Labs: No results for input(s): PROCALCITON, LATICACIDVEN in the last 168 hours.  Recent Results (from the past 240 hour(s))  Culture, expectorated sputum-assessment     Status: None   Collection Time: 11/23/17 10:40 AM  Result Value Ref Range Status   Specimen Description SPUTUM  Final   Special Requests Normal  Final   Sputum evaluation   Final    Sputum specimen not acceptable for testing.  Please recollect.   RESULT CALLED TO, READ BACK BY AND VERIFIED WITH: L WORLEY RN 11/24/17 0446 JDW    Report  Status 11/25/2017 FINAL  Final  Acid Fast Smear (AFB)     Status: None   Collection Time: 11/23/17 10:40 AM  Result Value Ref Range Status   AFB Specimen Processing Concentration  Final   Acid Fast Smear Negative  Final    Comment: (NOTE) Performed At: [facility] [place], [place] [id number] Rush Farmer MD [id number]    Source (AFB) SPUTUM  Final  Gram stain     Status: None   Collection Time: 11/24/17 12:29 AM  Result Value Ref Range Status   Specimen Description FLUID PLEURAL LEFT  Final   Special Requests NONE  Final   Gram Stain   Final    FEW WBC PRESENT, PREDOMINANTLY PMN NO ORGANISMS SEEN    Report Status 11/25/2017 FINAL  Final  Acid Fast Smear (AFB)     Status: None   Collection Time: 11/24/17 12:29 AM  Result  Value Ref Range Status   AFB Specimen Processing Concentration  Final   Acid Fast Smear Negative  Final    Comment: (NOTE) Performed At: [facility] [place], [place] [id number] Rush Farmer MD LR:[id number]    Source (AFB) FLUID  Final    Comment: PLEURAL LEFT   Acid Fast Smear (AFB)     Status: None   Collection Time: 11/24/17  6:42 AM  Result Value Ref Range Status   AFB Specimen Processing Concentration  Final   Acid Fast Smear Negative  Final    Comment: (NOTE) Performed At: [facility] [place], [place] [id number] Rush Farmer MD BE:[id number]    Source (AFB) EXPECTORATED SPUTUM  Final         Radiology Studies: No results found.      Scheduled Meds: . aspirin EC  81 mg Oral Daily  . diltiazem  120 mg Oral BID  . enoxaparin (LOVENOX) injection  40 mg Subcutaneous Q24H  . ethambutol  1,600 mg Oral Daily  . isoniazid  300 mg Oral Daily  . metoprolol tartrate  100 mg Oral BID  . pyrazinamide  2,000 mg Oral Daily  . vitamin B-6  50 mg Oral Daily  . rifampin  600 mg Oral Daily  . sodium chloride flush  3 mL Intravenous Q12H   Continuous Infusions: .  sodium chloride       LOS: 14 days    Time spent: 35 minutes.     Elmarie Shiley, MD [facility] Pager [phone]  If 7PM-7AM, please contact night-coverage [email or web address] Password [facility] 11/30/2017, 12:56 PM

## 2017-11-30 NOTE — Progress Notes (Signed)
Subjective: No new complaints Continues to be reluctant to the diagnosis of TB.  Concerned about when he can go back to work.  Otherwise feels well.   Antibiotics:  Anti-infectives (From admission, onward)   Start     Dose/Rate Route Frequency Ordered Stop   11/29/17 1300  cefTRIAXone (ROCEPHIN) 1 g in dextrose 5 % 50 mL IVPB     1 g 100 mL/hr over 30 Minutes Intravenous Every 24 hours 11/29/17 1259     11/27/17 1400  isoniazid (NYDRAZID) tablet 300 mg     300 mg Oral Daily 11/27/17 1302     11/27/17 1400  rifampin (RIFADIN) capsule 600 mg     600 mg Oral Daily 11/27/17 1302     11/27/17 1400  pyrazinamide tablet 2,000 mg     2,000 mg Oral Daily 11/27/17 1302     11/27/17 1400  ethambutol (MYAMBUTOL) tablet 1,600 mg     1,600 mg Oral Daily 11/27/17 1302     11/25/17 1400  azithromycin (ZITHROMAX) tablet 500 mg  Status:  Discontinued     500 mg Oral Daily 11/25/17 1314 11/27/17 1302   11/23/17 1200  cefTRIAXone (ROCEPHIN) 1 g in dextrose 5 % 50 mL IVPB  Status:  Discontinued     1 g 100 mL/hr over 30 Minutes Intravenous Daily 11/23/17 1113 11/27/17 1302   11/23/17 1115  azithromycin (ZITHROMAX) 500 mg in dextrose 5 % 250 mL IVPB  Status:  Discontinued     500 mg 250 mL/hr over 60 Minutes Intravenous Every 24 hours 11/23/17 1042 11/25/17 1313   11/18/17 1500  levofloxacin (LEVAQUIN) tablet 500 mg  Status:  Discontinued     500 mg Oral Daily 11/17/17 1550 11/22/17 1406   11/16/17 1530  levofloxacin (LEVAQUIN) IVPB 750 mg  Status:  Discontinued     750 mg 100 mL/hr over 90 Minutes Intravenous Every 24 hours 11/16/17 1520 11/17/17 1550   11/16/17 1400  levofloxacin (LEVAQUIN) IVPB 750 mg     750 mg 100 mL/hr over 90 Minutes Intravenous  Once 11/16/17 1357 11/16/17 1616     Medications: Scheduled Meds: . aspirin EC  81 mg Oral Daily  . diltiazem  120 mg Oral BID  . enoxaparin (LOVENOX) injection  40 mg Subcutaneous Q24H  . ethambutol  1,600 mg Oral Daily  .  isoniazid  300 mg Oral Daily  . metoprolol tartrate  100 mg Oral BID  . pyrazinamide  2,000 mg Oral Daily  . vitamin B-6  50 mg Oral Daily  . rifampin  600 mg Oral Daily  . sodium chloride flush  3 mL Intravenous Q12H   Continuous Infusions: . sodium chloride    . cefTRIAXone (ROCEPHIN)  IV Stopped (11/29/17 1537)   PRN Meds:.sodium chloride, acetaminophen **OR** acetaminophen, hydrALAZINE, ibuprofen, lidocaine, menthol-cetylpyridinium, morphine injection, ondansetron **OR** ondansetron (ZOFRAN) IV, oxyCODONE, sodium chloride flush  Objective: Weight change: 3 lb 14.4 oz (1.769 kg)  Intake/Output Summary (Last 24 hours) at 11/30/2017 1014 Last data filed at 11/30/2017 0945 Gross per 24 hour  Intake 770 ml  Output 1400 ml  Net -630 ml   Blood pressure 119/85, pulse 74, temperature 98.9 F (37.2 C), temperature source Oral, resp. rate 20, height 5\' 8"  (1.727 m), weight 194 lb 14.4 oz (88.4 kg), SpO2 100 %. Temp:  [98.9 F (37.2 C)-100.6 F (38.1 C)] 98.9 F (37.2 C) (02/04 0525) Pulse Rate:  [74-95] 74 (02/04 0525) Resp:  [20] 20 (02/04  0525) BP: (109-120)/(72-85) 119/85 (02/04 0525) SpO2:  [97 %-100 %] 100 % (02/04 0525) Weight:  [194 lb 14.4 oz (88.4 kg)] 194 lb 14.4 oz (88.4 kg) (02/04 0525)  Physical Exam: General: Alert and awake, oriented x3, not in any acute distress. HEENT: anicteric sclera, pupils reactive to light and accommodation, EOMI CVS regular rate, normal r,  no murmur rubs or gallops Chest: clear to auscultation bilaterally, no wheezing, rales or rhonchi Abdomen: soft nontender, nondistended, normal bowel sounds, Extremities: no  clubbing or edema noted bilaterally Skin: no rashes Lymph: no new lymphadenopathy Neuro: nonfocal  CBC: @LABBLAST3 (wbc3,Hgb:3,Hct:3,Plt:3,INR:3APTT:3)@  BMET Recent Labs    11/28/17 0941  NA 136  K 4.2  CL 102  CO2 24  GLUCOSE 115*  BUN 18  CREATININE 1.13  CALCIUM 8.6*   Liver Panel  No results for input(s): PROT,  ALBUMIN, AST, ALT, ALKPHOS, BILITOT, BILIDIR, IBILI in the last 72 hours.  Sedimentation Rate No results for input(s): ESRSEDRATE in the last 72 hours. C-Reactive Protein No results for input(s): CRP in the last 72 hours.  Micro Results: Recent Results (from the past 720 hour(s))  Culture, blood (routine x 2)     Status: None   Collection Time: 11/16/17  1:25 PM  Result Value Ref Range Status   Specimen Description BLOOD RIGHT ANTECUBITAL  Final   Special Requests   Final    BOTTLES DRAWN AEROBIC AND ANAEROBIC Blood Culture adequate volume   Culture NO GROWTH 5 DAYS  Final   Report Status 11/21/2017 FINAL  Final  Culture, blood (routine x 2)     Status: None   Collection Time: 11/16/17  3:09 PM  Result Value Ref Range Status   Specimen Description BLOOD LEFT ANTECUBITAL  Final   Special Requests   Final    BOTTLES DRAWN AEROBIC AND ANAEROBIC Blood Culture adequate volume   Culture NO GROWTH 5 DAYS  Final   Report Status 11/21/2017 FINAL  Final  Gram stain     Status: None   Collection Time: 11/18/17 12:48 PM  Result Value Ref Range Status   Specimen Description PLEURAL LEFT  Final   Special Requests NONE  Final   Gram Stain   Final    FEW WBC PRESENT, PREDOMINANTLY MONONUCLEAR NO ORGANISMS SEEN    Report Status 11/18/2017 FINAL  Final  Culture, body fluid-bottle     Status: None   Collection Time: 11/18/17 12:48 PM  Result Value Ref Range Status   Specimen Description PLEURAL LEFT  Final   Special Requests NONE  Final   Culture NO GROWTH 5 DAYS  Final   Report Status 11/23/2017 FINAL  Final  Culture, expectorated sputum-assessment     Status: None   Collection Time: 11/23/17 10:40 AM  Result Value Ref Range Status   Specimen Description SPUTUM  Final   Special Requests Normal  Final   Sputum evaluation   Final    Sputum specimen not acceptable for testing.  Please recollect.   RESULT CALLED TO, READ BACK BY AND VERIFIED WITH: Sheffield Slider RN 11/24/17 0446 JDW    Report  Status 11/25/2017 FINAL  Final  Acid Fast Smear (AFB)     Status: None   Collection Time: 11/23/17 10:40 AM  Result Value Ref Range Status   AFB Specimen Processing Concentration  Final   Acid Fast Smear Negative  Final    Comment: (NOTE) Performed At: [facility] [address] [place], [place] [id number] Jolene Schimke MD WU:[id number]  Source (AFB) SPUTUM  Final  Gram stain     Status: None   Collection Time: 11/24/17 12:29 AM  Result Value Ref Range Status   Specimen Description FLUID PLEURAL LEFT  Final   Special Requests NONE  Final   Gram Stain   Final    FEW WBC PRESENT, PREDOMINANTLY PMN NO ORGANISMS SEEN    Report Status 11/25/2017 FINAL  Final  Acid Fast Smear (AFB)     Status: None   Collection Time: 11/24/17 12:29 AM  Result Value Ref Range Status   AFB Specimen Processing Concentration  Final   Acid Fast Smear Negative  Final    Comment: (NOTE) Performed At: [facility] [address] [place], [place] [id number] Jolene Schimke MD [id number]    Source (AFB) FLUID  Final    Comment: PLEURAL LEFT   Acid Fast Smear (AFB)     Status: None   Collection Time: 11/24/17  6:42 AM  Result Value Ref Range Status   AFB Specimen Processing Concentration  Final   Acid Fast Smear Negative  Final    Comment: (NOTE) Performed At: [facility] [address] [place], [place] [id number] Jolene Schimke MD QQ:[id number]    Source (AFB) EXPECTORATED SPUTUM  Final   Studies/Results: No results found.  Assessment/Plan:  INTERVAL HISTORY: [employer]. ADA 3.4 u/L AFB negative x 3 Remains afebrile and hemodynamically stable  Repeat CXR today   Principal Problem:   PNA (pneumonia) Active Problems:   Acute respiratory failure with hypoxia (HCC)   Hypertension   Pleural effusion   S/P thoracentesis   Abnormal transaminases   Extrapulmonary tuberculosis  Scott Rowe is a 47 y.o. male originally from [place] who presented with 3  weeks of progressive dyspnea, cough, and pleuritic chest pain. CT chest on admission illustrated a L>R pleural effusion with compressive atelectasis vs infiltration with nonspecific enlarged lymph nodes. Pleural fluid analysis indicating an exudative process that does not appear to be drug-induced, endocrine, parapneumonic, malignant, lymphatic, or autoimmune in nature. A negative ADA essentially rules out the diagnosis of extrapulmonary TB. At this point if pleural effusion continued to recur would recommend pleural biopsy and/or mediastinal lymph node biopsy.   - Repeat CXR today  - May consider stopping RIPE therapy. Will discuss case with health department and see if they have records of the patient being treated for Latent TB  - Would recommend pleural biopsy and/or mediastinal lymph node biopsy.    LOS: 14 days   [facility] 11/30/2017, 10:14 AM

## 2017-12-01 DIAGNOSIS — R0600 Dyspnea, unspecified: Secondary | ICD-10-CM

## 2017-12-01 DIAGNOSIS — R071 Chest pain on breathing: Secondary | ICD-10-CM

## 2017-12-01 DIAGNOSIS — R599 Enlarged lymph nodes, unspecified: Secondary | ICD-10-CM

## 2017-12-01 LAB — QUANTIFERON-TB GOLD PLUS: QUANTIFERON-TB GOLD PLUS: NEGATIVE

## 2017-12-01 LAB — QUANTIFERON-TB GOLD PLUS (RQFGPL)
QUANTIFERON TB1 AG VALUE: 0.05 [IU]/mL
QuantiFERON Mitogen Value: 1.01 IU/mL
QuantiFERON Nil Value: 0.04 IU/mL
QuantiFERON TB2 Ag Value: 0.06 IU/mL

## 2017-12-01 LAB — CRYPTOCOCCAL ANTIGEN: CRYPTO AG: NEGATIVE

## 2017-12-01 LAB — FERRITIN: Ferritin: 506 ng/mL — ABNORMAL HIGH (ref 24–336)

## 2017-12-01 MED ORDER — IBUPROFEN 600 MG PO TABS
600.0000 mg | ORAL_TABLET | Freq: Three times a day (TID) | ORAL | Status: DC
  Administered 2017-12-01 – 2017-12-04 (×8): 600 mg via ORAL
  Filled 2017-12-01 (×10): qty 1

## 2017-12-01 NOTE — Progress Notes (Signed)
Subjective: Having worsening of his chest pain since yesterday. Appear pleuritic in nature and is positional (better when leaning forward). Does not want to leave the hospital until he knows what is going on as he is concerned the fluid will continue to reaccumulate.   Antibiotics:  Anti-infectives (From admission, onward)   Start     Dose/Rate Route Frequency Ordered Stop   11/29/17 1300  cefTRIAXone (ROCEPHIN) 1 g in dextrose 5 % 50 mL IVPB  Status:  Discontinued     1 g 100 mL/hr over 30 Minutes Intravenous Every 24 hours 11/29/17 1259 11/30/17 1139   11/27/17 1400  isoniazid (NYDRAZID) tablet 300 mg     300 mg Oral Daily 11/27/17 1302     11/27/17 1400  rifampin (RIFADIN) capsule 600 mg     600 mg Oral Daily 11/27/17 1302     11/27/17 1400  pyrazinamide tablet 2,000 mg     2,000 mg Oral Daily 11/27/17 1302     11/27/17 1400  ethambutol (MYAMBUTOL) tablet 1,600 mg     1,600 mg Oral Daily 11/27/17 1302     11/25/17 1400  azithromycin (ZITHROMAX) tablet 500 mg  Status:  Discontinued     500 mg Oral Daily 11/25/17 1314 11/27/17 1302   11/23/17 1200  cefTRIAXone (ROCEPHIN) 1 g in dextrose 5 % 50 mL IVPB  Status:  Discontinued     1 g 100 mL/hr over 30 Minutes Intravenous Daily 11/23/17 1113 11/27/17 1302   11/23/17 1115  azithromycin (ZITHROMAX) 500 mg in dextrose 5 % 250 mL IVPB  Status:  Discontinued     500 mg 250 mL/hr over 60 Minutes Intravenous Every 24 hours 11/23/17 1042 11/25/17 1313   11/18/17 1500  levofloxacin (LEVAQUIN) tablet 500 mg  Status:  Discontinued     500 mg Oral Daily 11/17/17 1550 11/22/17 1406   11/16/17 1530  levofloxacin (LEVAQUIN) IVPB 750 mg  Status:  Discontinued     750 mg 100 mL/hr over 90 Minutes Intravenous Every 24 hours 11/16/17 1520 11/17/17 1550   11/16/17 1400  levofloxacin (LEVAQUIN) IVPB 750 mg     750 mg 100 mL/hr over 90 Minutes Intravenous  Once 11/16/17 1357 11/16/17 1616     Medications: Scheduled Meds: . aspirin EC  81  mg Oral Daily  . diltiazem  120 mg Oral BID  . enoxaparin (LOVENOX) injection  40 mg Subcutaneous Q24H  . ethambutol  1,600 mg Oral Daily  . isoniazid  300 mg Oral Daily  . metoprolol tartrate  100 mg Oral BID  . pyrazinamide  2,000 mg Oral Daily  . vitamin B-6  50 mg Oral Daily  . rifampin  600 mg Oral Daily  . sodium chloride flush  3 mL Intravenous Q12H   Continuous Infusions: . sodium chloride     PRN Meds:.sodium chloride, acetaminophen **OR** acetaminophen, hydrALAZINE, ibuprofen, lidocaine, menthol-cetylpyridinium, morphine injection, ondansetron **OR** ondansetron (ZOFRAN) IV, oxyCODONE, sodium chloride flush  Objective: Weight change: -14.4 oz (-0.862 kg)  Intake/Output Summary (Last 24 hours) at 12/01/2017 0848 Last data filed at 12/01/2017 0440 Gross per 24 hour  Intake 720 ml  Output 400 ml  Net 320 ml   Blood pressure 107/66, pulse (!) 56, temperature 99.6 F (37.6 C), temperature source Oral, resp. rate 20, height 5\' 8"  (1.727 m), weight 193 lb (87.5 kg), SpO2 100 %. Temp:  [97.9 F (36.6 C)-99.6 F (37.6 C)] 99.6 F (37.6 C) (02/05 0438) Pulse Rate:  [  56-94] 56 (02/05 0438) Resp:  [20] 20 (02/05 0438) BP: (107-124)/(60-81) 107/66 (02/05 0438) SpO2:  [99 %-100 %] 100 % (02/05 0438) Weight:  [193 lb (87.5 kg)] 193 lb (87.5 kg) (02/05 0438)  Physical Exam: General: Alert and awake, oriented x3, not in any acute distress. HEENT: anicteric sclera, pupils reactive to light and accommodation, EOMI CVS regular rate, normal r,  no murmur rubs or gallops Chest: absent basilar breath sounds on the left, good air movement on the right, no wheezing, rales or rhonchi Abdomen: soft nontender, nondistended, normal bowel sounds, Extremities: no  clubbing or edema noted bilaterally Skin: no rashes Lymph: no new lymphadenopathy Neuro: nonfocal  CBC: @LABBLAST3 (wbc3,Hgb:3,Hct:3,Plt:3,INR:3APTT:3)@  BMET Recent Labs    11/28/17 0941  NA 136  K 4.2  CL 102  CO2 24    GLUCOSE 115*  BUN 18  CREATININE 1.13  CALCIUM 8.6*   Liver Panel  No results for input(s): PROT, ALBUMIN, AST, ALT, ALKPHOS, BILITOT, BILIDIR, IBILI in the last 72 hours.  Sedimentation Rate No results for input(s): ESRSEDRATE in the last 72 hours. C-Reactive Protein No results for input(s): CRP in the last 72 hours.  Micro Results: Recent Results (from the past 720 hour(s))  Culture, blood (routine x 2)     Status: None   Collection Time: 11/16/17  1:25 PM  Result Value Ref Range Status   Specimen Description BLOOD RIGHT ANTECUBITAL  Final   Special Requests   Final    BOTTLES DRAWN AEROBIC AND ANAEROBIC Blood Culture adequate volume   Culture NO GROWTH 5 DAYS  Final   Report Status 11/21/2017 FINAL  Final  Culture, blood (routine x 2)     Status: None   Collection Time: 11/16/17  3:09 PM  Result Value Ref Range Status   Specimen Description BLOOD LEFT ANTECUBITAL  Final   Special Requests   Final    BOTTLES DRAWN AEROBIC AND ANAEROBIC Blood Culture adequate volume   Culture NO GROWTH 5 DAYS  Final   Report Status 11/21/2017 FINAL  Final  Gram stain     Status: None   Collection Time: 11/18/17 12:48 PM  Result Value Ref Range Status   Specimen Description PLEURAL LEFT  Final   Special Requests NONE  Final   Gram Stain   Final    FEW WBC PRESENT, PREDOMINANTLY MONONUCLEAR NO ORGANISMS SEEN    Report Status 11/18/2017 FINAL  Final  Culture, body fluid-bottle     Status: None   Collection Time: 11/18/17 12:48 PM  Result Value Ref Range Status   Specimen Description PLEURAL LEFT  Final   Special Requests NONE  Final   Culture NO GROWTH 5 DAYS  Final   Report Status 11/23/2017 FINAL  Final  Culture, expectorated sputum-assessment     Status: None   Collection Time: 11/23/17 10:40 AM  Result Value Ref Range Status   Specimen Description SPUTUM  Final   Special Requests Normal  Final   Sputum evaluation   Final    Sputum specimen not acceptable for testing.  Please  recollect.   RESULT CALLED TO, READ BACK BY AND VERIFIED WITH: L WORLEY RN 11/24/17 0446 JDW    Report Status 11/25/2017 FINAL  Final  Acid Fast Smear (AFB)     Status: None   Collection Time: 11/23/17 10:40 AM  Result Value Ref Range Status   AFB Specimen Processing Concentration  Final   Acid Fast Smear Negative  Final    Comment: (NOTE) Performed At:  [facility] [address] [place], [place] [id number] Jolene Schimke MD WU:[id number]    Source (AFB) SPUTUM  Final  Gram stain     Status: None   Collection Time: 11/24/17 12:29 AM  Result Value Ref Range Status   Specimen Description FLUID PLEURAL LEFT  Final   Special Requests NONE  Final   Gram Stain   Final    FEW WBC PRESENT, PREDOMINANTLY PMN NO ORGANISMS SEEN    Report Status 11/25/2017 FINAL  Final  Acid Fast Smear (AFB)     Status: None   Collection Time: 11/24/17 12:29 AM  Result Value Ref Range Status   AFB Specimen Processing Concentration  Final   Acid Fast Smear Negative  Final    Comment: (NOTE) Performed At: [facility] [address] [place], [place] [id number] Jolene Schimke MD VH:[id number]    Source (AFB) FLUID  Final    Comment: PLEURAL LEFT   Acid Fast Smear (AFB)     Status: None   Collection Time: 11/24/17  6:42 AM  Result Value Ref Range Status   AFB Specimen Processing Concentration  Final   Acid Fast Smear Negative  Final    Comment: (NOTE) Performed At: [facility] [address] [place], [place] [id number] Jolene Schimke MD UV:[id number]    Source (AFB) EXPECTORATED SPUTUM  Final  Culture, blood (routine x 2)     Status: None (Preliminary result)   Collection Time: 11/29/17  1:24 PM  Result Value Ref Range Status   Specimen Description BLOOD LEFT FOREARM  Final   Special Requests IN PEDIATRIC BOTTLE Blood Culture adequate volume  Final   Culture   Final    NO GROWTH 1 DAY Performed at [facility], [address] [place], [place] [address]     Report Status PENDING  Incomplete  Culture, blood (routine x 2)     Status: None (Preliminary result)   Collection Time: 11/29/17  1:27 PM  Result Value Ref Range Status   Specimen Description BLOOD LEFT HAND  Final   Special Requests IN PEDIATRIC BOTTLE Blood Culture adequate volume  Final   Culture   Final    NO GROWTH 1 DAY Performed at [facility], [address], [place], [place] [address]    Report Status PENDING  Incomplete   Studies/Results: Dg Chest 2 View  Result Date: 11/30/2017 CLINICAL DATA:  Hypertension, asthma, dyspnea, chest pain, productive cough, BILATERAL pleural effusions post LEFT thoracentesis EXAM: CHEST  2 VIEW COMPARISON:  11/28/2017 FINDINGS: Enlargement of cardiac silhouette. Mediastinal contours and pulmonary vascularity normal. Persistent LEFT pleural effusion and basilar opacity question atelectasis versus consolidation. Tiny RIGHT pleural effusion. Remaining lungs clear. No additional infiltrate or pneumothorax. Bones unremarkable. IMPRESSION: Persistent LEFT pleural effusion with atelectasis versus consolidation at LEFT base. Enlargement of cardiac silhouette with tiny RIGHT pleural effusion. Electronically Signed   By: Ulyses Southward M.D.   On: 11/30/2017 13:35   Assessment/Plan:  INTERVAL HISTORY:  Remains afebrile and hemodynamically stable  Worsening pleuritic chest pain  Principal Problem:   PNA (pneumonia) Active Problems:   Respiratory failure (HCC)   Hypertension   Recurrent pleural effusion on left   S/P thoracentesis   Abnormal transaminases   Extrapulmonary tuberculosis  Kathreen Devoid a 46 y.o.maleoriginally from [place] who presented with 3 weeks of progressive dyspnea, cough, and pleuritic chest pain. CT chest on admission illustrated a L>R pleural effusion with compressive atelectasis vs infiltration with nonspecific enlarged lymph nodes.Pleural fluid  analysis indicating an exudative process, below is a review of the labs we have  to date.   Autoimmune: - RF weakly positive 15.8  - ANCAs negative (Ruling out Wegner's and Churg-Strauss) - ANA negative (Ruling out Lupus)   Endocrine:  - TSH 1.481  Infectious: - Pleural fluid gram stain and cultures negative  - HIV negative - AFB x 3 negative  - ADA 3.4 (High sensitivity making TB as the cause less likley)  - Crypto antigen negative  - No eosinophils noted (making parasitic infection unlikely) - Pending histo  Malignancy:  - Cytology negative (does not completely rule out malignancy) - Total protein <5 making paraproteinemia unlikely  - Triglycerides 31 (ruling out chylothorax)  GI:  - Amylase 35 and pH 7.5 making esophageal rupture or perforation unlikely    Other: - Not on any medications prior to admission to explain recurrent effusions  - No ascites to suspect intraabdominal etiology and location on the left side is atypical  - Not uremic on presentation  - Pending ACE levels   A significant number of causes of exudative effusion have been ruled out to date. We are awaiting results of a histo and ACE level. Repeat CXR yesterday did illustrate enlargement of the cardiac silhouette which could represent worsening pericardial effusion. This could be a result of constrictive pericarditis indicating the patient's presentation is secondary to post-viral, granulomatous (TB vs Sarcoid), or malignancy.    - Continue RIPE therapy. Have coordinated with the health department and they will get induced sputum from the patient before he can be off contact precautions.  - Recommend repeat thoracentesis with repeat analysis of pleural fluid    LOS: 15 days   [facility]mond[facility] Alick Lecomte 12/01/2017, 8:48 AM

## 2017-12-01 NOTE — Progress Notes (Signed)
PROGRESS NOTE    Scott Rowe  SWN:[id number] DOB: [birth year] DOA: 11/16/2017 PCP: Patient, No Pcp Per    Brief Narrative: Scott Rowe is a 47 y.o. male with a history of HTN not on medications and asthma who presented to the ED with dyspnea, chest pain and productive cough. CT chest demonstrated L > R pleural effusions with compressive atelectasis vs. infiltrate with nonspecific enlarged LNs. He was hypoxic, started on levaquin and supplemental oxygen. Left thoracentesis of 1.4L on 1/23 showed exudate and repeat CTA ruled out PE. No definite mass noted. Cultures have remained negative to date. Cytology from effusion was negative. Thoracentesis repeated 1/29.      Assessment & Plan:   Principal Problem:   PNA (pneumonia) Active Problems:   Respiratory failure (HCC)   Hypertension   Recurrent pleural effusion on left   S/P thoracentesis   Abnormal transaminases   Extrapulmonary tuberculosis   1-Acute hypoxic Respiratory failure; secondary to PNA and pleural effusion.  Received 5 days  IV antibiotics until 2-03 He has had two thoracentesis.  Pulmonary following.  Gold quantiferon negative WBC at 14.  Spike fever, blood culture pending.  Repeat chest x ray persistent left pleural effusion.  On RIPE therapy.   2-Pleural effusion, left exudative.  He has had thoracentesis times 2.  Fluids, WBC 2850, gram stain negative, culture negative FAB one of three negative Chest x ray today, with slightly recurrence of pleural effusion.   Autoimmune work up (RF weakly positive at 15.8, ANA neg, ESR grossly elevated at 70),  On IV ceftriaxone and azithromycin. Finished treatment, discontinue by ID>  ADA pending.  ID consulted, due to positive PPD. Dr Tommy Medal is recommending tx for TB  Patient complaining today of worsening chest pain and dyspnea. Repeated chest x ray witn increase size pleural effusion, moderate size.  Per Dr Servando Snare VATS might be low yield.  Per ID ADA negative     Consulted CCM again for evaluation, for repeat thoracentesis vs pluerex cathe  PPD positive 20 mm; ID consulted.  Getting treatment for tb  Transaminases;  Increasing. Change Vicodin to oxycodone. Avoid tylenol.  Hepatitis panel negative.  Stable.    Paroxysmal atrial flutter: Echocardiogram shows normal atria. TSH 1.481.  Started ASA for CHA2DS2-VASc of 1 (HTN). On metoprolol and Cardizem.   Pericardial effusion: Small, not hemodynamically significant. No rub. Stable on serial CT's.     DVT prophylaxis: SCD Code Status: full code.  Family Communication: care discussed with patient  Disposition Plan: to be determine  Consultants:   Pulmonology   Procedures:  Thoracentesis.    Antimicrobials: ceftriaxone and azithromycin,      Subjective: He is breathing ok, chest pain continue with deep breath. Pain medications doesn't help.   Objective: Vitals:   11/30/17 1212 11/30/17 2049 12/01/17 0438 12/01/17 1127  BP: 116/81 121/60 107/66 (!) 104/59  Pulse: 76 69 (!) 56 94  Resp: 20 20 20    Temp: 97.9 F (36.6 C) 99.2 F (37.3 C) 99.6 F (37.6 C) 98.5 F (36.9 C)  TempSrc: Oral Oral Oral Oral  SpO2: 100% 99% 100% 98%  Weight:   87.5 kg (193 lb)   Height:        Intake/Output Summary (Last 24 hours) at 12/01/2017 1334 Last data filed at 12/01/2017 1100 Gross per 24 hour  Intake 240 ml  Output 625 ml  Net -385 ml   Filed Weights   11/29/17 0538 11/30/17 0525 12/01/17 0438  Weight: 86.6 kg (191 lb)  88.4 kg (194 lb 14.4 oz) 87.5 kg (193 lb)    Examination:  General exam: NAD Respiratory system:  CTA  Cardiovascular system: S 1, S2 RRR Gastrointestinal system: BS present, soft, nt Central nervous system: non focal.  Extremities: Symmetric power.  Skin:No rash.    Data Reviewed: I have personally reviewed following labs and imaging studies  CBC: Recent Labs  Lab 11/25/17 0517 11/26/17 0618 11/27/17 0842 11/29/17 1323  WBC 12.7* 14.1* 14.7* 14.5*   HGB 12.1* 11.9* 11.8* 11.2*  HCT 37.1* 36.5* 36.3* 35.1*  MCV 77.1* 77.0* 77.1* 77.3*  PLT 439* 453* 463* 482*   Basic Metabolic Panel: Recent Labs  Lab 11/25/17 0517 11/26/17 0618 11/28/17 0941  NA 136 137 136  K 4.3 4.3 4.2  CL 100* 102 102  CO2 25 24 24   GLUCOSE 107* 100* 115*  BUN 15 12 18   CREATININE 0.99 0.95 1.13  CALCIUM 8.3* 8.4* 8.6*   GFR: Estimated Creatinine Clearance: 88.7 mL/min (by C-G formula based on SCr of 1.13 mg/dL). Liver Function Tests: Recent Labs  Lab 11/25/17 0517 11/26/17 0618  AST 57* 42*  ALT 107* 94*  ALKPHOS 170* 156*  BILITOT 0.6 0.6  PROT 7.0 7.1  ALBUMIN 2.4* 2.3*   No results for input(s): LIPASE, AMYLASE in the last 168 hours. No results for input(s): AMMONIA in the last 168 hours. Coagulation Profile: No results for input(s): INR, PROTIME in the last 168 hours. Cardiac Enzymes: No results for input(s): CKTOTAL, CKMB, CKMBINDEX, TROPONINI in the last 168 hours. BNP (last 3 results) No results for input(s): PROBNP in the last 8760 hours. HbA1C: No results for input(s): HGBA1C in the last 72 hours. CBG: No results for input(s): GLUCAP in the last 168 hours. Lipid Profile: No results for input(s): CHOL, HDL, LDLCALC, TRIG, CHOLHDL, LDLDIRECT in the last 72 hours. Thyroid Function Tests: No results for input(s): TSH, T4TOTAL, FREET4, T3FREE, THYROIDAB in the last 72 hours. Anemia Panel: Recent Labs    12/01/17 0457  FERRITIN 506*   Sepsis Labs: No results for input(s): PROCALCITON, LATICACIDVEN in the last 168 hours.  Recent Results (from the past 240 hour(s))  Culture, expectorated sputum-assessment     Status: None   Collection Time: 11/23/17 10:40 AM  Result Value Ref Range Status   Specimen Description SPUTUM  Final   Special Requests Normal  Final   Sputum evaluation   Final    Sputum specimen not acceptable for testing.  Please recollect.   RESULT CALLED TO, READ BACK BY AND VERIFIED WITH: L WORLEY RN 11/24/17  0446 JDW    Report Status 11/25/2017 FINAL  Final  Acid Fast Smear (AFB)     Status: None   Collection Time: 11/23/17 10:40 AM  Result Value Ref Range Status   AFB Specimen Processing Concentration  Final   Acid Fast Smear Negative  Final    Comment: (NOTE) Performed At: [facility] [place], [place] [id number] Rush Farmer MD [id number]    Source (AFB) SPUTUM  Final  Gram stain     Status: None   Collection Time: 11/24/17 12:29 AM  Result Value Ref Range Status   Specimen Description FLUID PLEURAL LEFT  Final   Special Requests NONE  Final   Gram Stain   Final    FEW WBC PRESENT, PREDOMINANTLY PMN NO ORGANISMS SEEN    Report Status 11/25/2017 FINAL  Final  Acid Fast Smear (AFB)     Status: None   Collection Time:  11/24/17 12:29 AM  Result Value Ref Range Status   AFB Specimen Processing Concentration  Final   Acid Fast Smear Negative  Final    Comment: (NOTE) Performed At: [facility] [place], [place] [id number] Rush Farmer MD BP:[id number]    Source (AFB) FLUID  Final    Comment: PLEURAL LEFT   Acid Fast Smear (AFB)     Status: None   Collection Time: 11/24/17  6:42 AM  Result Value Ref Range Status   AFB Specimen Processing Concentration  Final   Acid Fast Smear Negative  Final    Comment: (NOTE) Performed At: [facility] [place], [place] [id number] Rush Farmer MD [id number]    Source (AFB) EXPECTORATED SPUTUM  Final  Culture, blood (routine x 2)     Status: None (Preliminary result)   Collection Time: 11/29/17  1:24 PM  Result Value Ref Range Status   Specimen Description BLOOD LEFT FOREARM  Final   Special Requests IN PEDIATRIC BOTTLE Blood Culture adequate volume  Final   Culture   Final    NO GROWTH 1 DAY Performed at [facility], [place] [address], [place], [place] [address]    Report Status PENDING  Incomplete  Culture, blood (routine x 2)     Status: None  (Preliminary result)   Collection Time: 11/29/17  1:27 PM  Result Value Ref Range Status   Specimen Description BLOOD LEFT HAND  Final   Special Requests IN PEDIATRIC BOTTLE Blood Culture adequate volume  Final   Culture   Final    NO GROWTH 1 DAY Performed at [facility], [place] [address] [place], [place] [address]    Report Status PENDING  Incomplete         Radiology Studies: Dg Chest 2 View  Result Date: 11/30/2017 CLINICAL DATA:  Hypertension, asthma, dyspnea, chest pain, productive cough, BILATERAL pleural effusions post LEFT thoracentesis EXAM: CHEST  2 VIEW COMPARISON:  11/28/2017 FINDINGS: Enlargement of cardiac silhouette. Mediastinal contours and pulmonary vascularity normal. Persistent LEFT pleural effusion and basilar opacity question atelectasis versus consolidation. Tiny RIGHT pleural effusion. Remaining lungs clear. No additional infiltrate or pneumothorax. Bones unremarkable. IMPRESSION: Persistent LEFT pleural effusion with atelectasis versus consolidation at LEFT base. Enlargement of cardiac silhouette with tiny RIGHT pleural effusion. Electronically Signed   By: Lavonia Dana M.D.   On: 11/30/2017 13:35        Scheduled Meds: . aspirin EC  81 mg Oral Daily  . diltiazem  120 mg Oral BID  . enoxaparin (LOVENOX) injection  40 mg Subcutaneous Q24H  . ethambutol  1,600 mg Oral Daily  . isoniazid  300 mg Oral Daily  . metoprolol tartrate  100 mg Oral BID  . pyrazinamide  2,000 mg Oral Daily  . vitamin B-6  50 mg Oral Daily  . rifampin  600 mg Oral Daily  . sodium chloride flush  3 mL Intravenous Q12H   Continuous Infusions: . sodium chloride       LOS: 15 days    Time spent: 35 minutes.     Elmarie Shiley, MD [facility] Pager [phone]  If 7PM-7AM, please contact night-coverage [email or web address] Password [email or web address] 12/01/2017, 1:34 PM

## 2017-12-01 NOTE — Progress Notes (Addendum)
Name: Scott Rowe MRN: [id number] DOB: 09/05/1971    ADMISSION DATE:  11/16/2017 CONSULTATION DATE: November 19, 2017  REFERRING MD : Hospitalist service  CHIEF COMPLAINT: Shortness of breath cough  BRIEF PATIENT DESCRIPTION:  47 year old with past medical history of hypertension  Admitted on 1/21 with dyspnea, chest pain.  Noted to have large left pleural effusion status post thoracentesis on 1/23 with removal of 1.4 L, repeat thoracentesis on 1/29 with removal of 800 mL.  The etiology of the effusion is unclear.  Labs tests show it is exudative in nature with negative cultures and cytology.  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 Reconsult 2/5 for persistent effusion with chest pain and dyspnea. He has been seen by CVTS and he is not a candidate for VATS surgery or biopsy. Autoimmune workup negative except for mild elevation in rheumatoid factor.  EVENTS 1/23 - pleural fluid - exudative with LDH 237 , protien  4.8 and glucose not avail. Mesothelial cells +, 69% lymphs 1/29  - pleural efluid with Gluc 122, LDH 130, and 69% polys and mesothelia cells +. AFB smear negative. 2/1- started on RIPE therapy for TB  SUBJECTIVE/OVERNIGHT/INTERVAL HX  VITAL SIGNS: Temp:  [98.5 F (36.9 C)-99.6 F (37.6 C)] 98.5 F (36.9 C) (02/05 1127) Pulse Rate:  [56-94] 94 (02/05 1127) Resp:  [20] 20 (02/05 0438) BP: (104-121)/(59-66) 104/59 (02/05 1127) SpO2:  [98 %-100 %] 98 % (02/05 1127) Weight:  [193 lb (87.5 kg)] 193 lb (87.5 kg) (02/05 0438)  PHYSICAL EXAMINATION: Blood pressure (!) 104/59, pulse 94, temperature 98.5 F (36.9 C), temperature source Oral, resp. rate 20, height 5\' 8"  (1.727 m), weight 193 lb (87.5 kg), SpO2 98 %. Gen:      No acute distress HEENT:  EOMI, sclera anicteric Neck:     No masses; no thyromegaly Lungs:    Clear to auscultation bilaterally; normal respiratory effort CV:         Regular rate and rhythm; no murmurs Abd:      +  bowel sounds; soft, non-tender; no palpable masses, no distension Ext:    No edema; adequate peripheral perfusion Skin:      Warm and dry; no rash Neuro: alert and oriented x 3 Psych: normal mood and affect  PULMONARY No results for input(s): PHART, PCO2ART, PO2ART, HCO3, TCO2, O2SAT in the last 168 hours.  Invalid input(s): PCO2, PO2  CBC Recent Labs  Lab 11/26/17 0618 11/27/17 0842 11/29/17 1323  HGB 11.9* 11.8* 11.2*  HCT 36.5* 36.3* 35.1*  WBC 14.1* 14.7* 14.5*  PLT 453* 463* 487*    COAGULATION No results for input(s): INR in the last 168 hours.  CARDIAC  No results for input(s): TROPONINI in the last 168 hours. No results for input(s): PROBNP in the last 168 hours.   CHEMISTRY Recent Labs  Lab 11/25/17 0517 11/26/17 0618 11/28/17 0941  NA 136 137 136  K 4.3 4.3 4.2  CL 100* 102 102  CO2 25 24 24   GLUCOSE 107* 100* 115*  BUN 15 12 18   CREATININE 0.99 0.95 1.13  CALCIUM 8.3* 8.4* 8.6*   Estimated Creatinine Clearance: 88.7 mL/min (by C-G formula based on SCr of 1.13 mg/dL).   LIVER Recent Labs  Lab 11/25/17 0517 11/26/17 0618  AST 57* 42*  ALT 107* 94*  ALKPHOS 170* 156*  BILITOT 0.6 0.6  PROT 7.0 7.1  ALBUMIN 2.4* 2.3*     INFECTIOUS No results for input(s): LATICACIDVEN, PROCALCITON in  the last 168 hours.   ENDOCRINE CBG (last 3)  No results for input(s): GLUCAP in the last 72 hours.   IMAGING x48h  - image(s) personally visualized  -   highlighted in bold Dg Chest 2 View  Result Date: 11/30/2017 CLINICAL DATA:  Hypertension, asthma, dyspnea, chest pain, productive cough, BILATERAL pleural effusions post LEFT thoracentesis EXAM: CHEST  2 VIEW COMPARISON:  11/28/2017 FINDINGS: Enlargement of cardiac silhouette. Mediastinal contours and pulmonary vascularity normal. Persistent LEFT pleural effusion and basilar opacity question atelectasis versus consolidation. Tiny RIGHT pleural effusion. Remaining lungs clear. No additional infiltrate or  pneumothorax. Bones unremarkable. IMPRESSION: Persistent LEFT pleural effusion with atelectasis versus consolidation at LEFT base. Enlargement of cardiac silhouette with tiny RIGHT pleural effusion. Electronically Signed   By: Ulyses Southward M.D.   On: 11/30/2017 13:35  I have reviewed the images personally  Bedside ultrasound 2/5 Left effusion     ASSESSMENT / PLAN: Left exudative pleural effusion  Diagnosis is parapneumonic versus tuberculous effusion.  There is no evidence of malignancy and low suspicion for autoimmune process Bedside ultrasound shows small to moderate effusion on the left.  We could have IR drain eval this to drain completely as it is causing some chest pain.  Would not recommend chest tube for now. Resend pleural fluid studies. Continue RIPE therapy per ID for TB Standing NSAIDs for pain and antiinflammatory effect. Agree with holding steroids.   Discussed with patient. We will continue to follow.  Chilton Greathouse MD [facility] Pager [phone] If no answer or after 3pm call: [phone] 12/01/2017, 1:35 PM

## 2017-12-02 ENCOUNTER — Inpatient Hospital Stay (HOSPITAL_COMMUNITY): Payer: Self-pay

## 2017-12-02 DIAGNOSIS — J90 Pleural effusion, not elsewhere classified: Secondary | ICD-10-CM

## 2017-12-02 LAB — CBC
HEMATOCRIT: 33.7 % — AB (ref 39.0–52.0)
Hemoglobin: 10.9 g/dL — ABNORMAL LOW (ref 13.0–17.0)
MCH: 24.9 pg — ABNORMAL LOW (ref 26.0–34.0)
MCHC: 32.3 g/dL (ref 30.0–36.0)
MCV: 76.9 fL — AB (ref 78.0–100.0)
Platelets: 429 10*3/uL — ABNORMAL HIGH (ref 150–400)
RBC: 4.38 MIL/uL (ref 4.22–5.81)
RDW: 13.4 % (ref 11.5–15.5)
WBC: 11.2 10*3/uL — AB (ref 4.0–10.5)

## 2017-12-02 LAB — BODY FLUID CELL COUNT WITH DIFFERENTIAL
Eos, Fluid: NONE SEEN %
Lymphs, Fluid: 38 %
Monocyte-Macrophage-Serous Fluid: 33 % — ABNORMAL LOW (ref 50–90)
NEUTROPHIL FLUID: 29 % — AB (ref 0–25)
WBC FLUID: 1340 uL — AB (ref 0–1000)

## 2017-12-02 LAB — BASIC METABOLIC PANEL
ANION GAP: 12 (ref 5–15)
BUN: 14 mg/dL (ref 6–20)
CO2: 23 mmol/L (ref 22–32)
Calcium: 8.5 mg/dL — ABNORMAL LOW (ref 8.9–10.3)
Chloride: 102 mmol/L (ref 101–111)
Creatinine, Ser: 0.97 mg/dL (ref 0.61–1.24)
GFR calc Af Amer: >60 mL/min (ref >60)
GFR calc non Af Amer: >60 mL/min (ref >60)
GLUCOSE: 100 mg/dL — AB (ref 65–99)
Potassium: 4 mmol/L (ref 3.5–5.1)
Sodium: 137 mmol/L (ref 135–145)

## 2017-12-02 LAB — GRAM STAIN

## 2017-12-02 LAB — LACTATE DEHYDROGENASE, PLEURAL OR PERITONEAL FLUID: LD, Fluid: 115 U/L — ABNORMAL HIGH (ref 3–23)

## 2017-12-02 LAB — PROTEIN, PLEURAL OR PERITONEAL FLUID: TOTAL PROTEIN, FLUID: 4.8 g/dL

## 2017-12-02 LAB — HISTOPLASMA ANTIGEN, URINE

## 2017-12-02 LAB — ECHOCARDIOGRAM LIMITED
Height: 68 in
Weight: 3125.24 oz

## 2017-12-02 LAB — ANGIOTENSIN CONVERTING ENZYME: Angiotensin-Converting Enzyme: 20 U/L (ref 14–82)

## 2017-12-02 MED ORDER — LIDOCAINE HCL (PF) 1 % IJ SOLN
INTRAMUSCULAR | Status: AC
  Filled 2017-12-02: qty 10

## 2017-12-02 NOTE — Procedures (Signed)
PROCEDURE SUMMARY:  Successful US guided left diagnostic and therapeutic thoracentesis. Yielded 600 mL of yellow fluid. Pt tolerated procedure well. No immediate complications.  Specimen was sent for labs. CXR ordered.  Hoyt KochKacie Sue-Ellen Dayshawn Irizarry PA-C 12/02/2017 9:41 AM

## 2017-12-02 NOTE — Progress Notes (Signed)
Subjective: Feeling much better this AM after thoracentesis.  Feels that the TB medications are working.  Requesting to bath.  No other questions or concerns.   Antibiotics:  Anti-infectives (From admission, onward)   Start     Dose/Rate Route Frequency Ordered Stop   11/29/17 1300  cefTRIAXone (ROCEPHIN) 1 g in dextrose 5 % 50 mL IVPB  Status:  Discontinued     1 g 100 mL/hr over 30 Minutes Intravenous Every 24 hours 11/29/17 1259 11/30/17 1139   11/27/17 1400  isoniazid (NYDRAZID) tablet 300 mg     300 mg Oral Daily 11/27/17 1302     11/27/17 1400  rifampin (RIFADIN) capsule 600 mg     600 mg Oral Daily 11/27/17 1302     11/27/17 1400  pyrazinamide tablet 2,000 mg     2,000 mg Oral Daily 11/27/17 1302     11/27/17 1400  ethambutol (MYAMBUTOL) tablet 1,600 mg     1,600 mg Oral Daily 11/27/17 1302     11/25/17 1400  azithromycin (ZITHROMAX) tablet 500 mg  Status:  Discontinued     500 mg Oral Daily 11/25/17 1314 11/27/17 1302   11/23/17 1200  cefTRIAXone (ROCEPHIN) 1 g in dextrose 5 % 50 mL IVPB  Status:  Discontinued     1 g 100 mL/hr over 30 Minutes Intravenous Daily 11/23/17 1113 11/27/17 1302   11/23/17 1115  azithromycin (ZITHROMAX) 500 mg in dextrose 5 % 250 mL IVPB  Status:  Discontinued     500 mg 250 mL/hr over 60 Minutes Intravenous Every 24 hours 11/23/17 1042 11/25/17 1313   11/18/17 1500  levofloxacin (LEVAQUIN) tablet 500 mg  Status:  Discontinued     500 mg Oral Daily 11/17/17 1550 11/22/17 1406   11/16/17 1530  levofloxacin (LEVAQUIN) IVPB 750 mg  Status:  Discontinued     750 mg 100 mL/hr over 90 Minutes Intravenous Every 24 hours 11/16/17 1520 11/17/17 1550   11/16/17 1400  levofloxacin (LEVAQUIN) IVPB 750 mg     750 mg 100 mL/hr over 90 Minutes Intravenous  Once 11/16/17 1357 11/16/17 1616     Medications: Scheduled Meds: . aspirin EC  81 mg Oral Daily  . diltiazem  120 mg Oral BID  . enoxaparin (LOVENOX) injection  40 mg Subcutaneous Q24H    . ethambutol  1,600 mg Oral Daily  . ibuprofen  600 mg Oral TID  . isoniazid  300 mg Oral Daily  . lidocaine (PF)      . metoprolol tartrate  100 mg Oral BID  . pyrazinamide  2,000 mg Oral Daily  . vitamin B-6  50 mg Oral Daily  . rifampin  600 mg Oral Daily  . sodium chloride flush  3 mL Intravenous Q12H   Continuous Infusions: . sodium chloride     PRN Meds:.sodium chloride, acetaminophen **OR** acetaminophen, hydrALAZINE, lidocaine, menthol-cetylpyridinium, morphine injection, ondansetron **OR** ondansetron (ZOFRAN) IV, oxyCODONE, sodium chloride flush  Objective: Weight change: 2 lb 5.2 oz (1.056 kg)  Intake/Output Summary (Last 24 hours) at 12/02/2017 0945 Last data filed at 12/02/2017 1610 Gross per 24 hour  Intake 243 ml  Output 1325 ml  Net -1082 ml   Blood pressure (!) 147/98, pulse 97, temperature 98.4 F (36.9 C), temperature source Oral, resp. rate 18, height 5\' 8"  (1.727 m), weight 195 lb 5.2 oz (88.6 kg), SpO2 99 %. Temp:  [98.4 F (36.9 C)-98.5 F (36.9 C)] 98.4 F (36.9 C) (02/06 0515) Pulse  Rate:  [81-97] 97 (02/06 0843) Resp:  [18-19] 18 (02/06 0515) BP: (104-147)/(59-98) 147/98 (02/06 0843) SpO2:  [97 %-99 %] 99 % (02/06 0843) Weight:  [195 lb 5.2 oz (88.6 kg)] 195 lb 5.2 oz (88.6 kg) (02/06 0515)  Physical Exam: General: Alert and awake, oriented x3, not in any acute distress. HEENT: Anicteric sclera, pupils reactive to light and accommodation, EOMI CVS: Regular rate, normal r,  no murmur rubs or gallops Chest: Improved air movement on the right, clear breath sounds on the left  Abdomen: Soft nontender, nondistended, normal bowel sounds, Extremities: No clubbing or edema noted bilaterally Skin: No rashes Lymph: No new lymphadenopathy Neuro: Nonfocal  CBC: @LABBLAST3 (wbc3,Hgb:3,Hct:3,Plt:3,INR:3APTT:3)@  BMET Recent Labs    12/02/17 0755  NA 137  K 4.0  CL 102  CO2 23  GLUCOSE 100*  BUN 14  CREATININE 0.97  CALCIUM 8.5*   Liver  Panel  No results for input(s): PROT, ALBUMIN, AST, ALT, ALKPHOS, BILITOT, BILIDIR, IBILI in the last 72 hours.  Sedimentation Rate No results for input(s): ESRSEDRATE in the last 72 hours. C-Reactive Protein No results for input(s): CRP in the last 72 hours.  Micro Results: Recent Results (from the past 720 hour(s))  Culture, blood (routine x 2)     Status: None   Collection Time: 11/16/17  1:25 PM  Result Value Ref Range Status   Specimen Description BLOOD RIGHT ANTECUBITAL  Final   Special Requests   Final    BOTTLES DRAWN AEROBIC AND ANAEROBIC Blood Culture adequate volume   Culture NO GROWTH 5 DAYS  Final   Report Status 11/21/2017 FINAL  Final  Culture, blood (routine x 2)     Status: None   Collection Time: 11/16/17  3:09 PM  Result Value Ref Range Status   Specimen Description BLOOD LEFT ANTECUBITAL  Final   Special Requests   Final    BOTTLES DRAWN AEROBIC AND ANAEROBIC Blood Culture adequate volume   Culture NO GROWTH 5 DAYS  Final   Report Status 11/21/2017 FINAL  Final  Gram stain     Status: None   Collection Time: 11/18/17 12:48 PM  Result Value Ref Range Status   Specimen Description PLEURAL LEFT  Final   Special Requests NONE  Final   Gram Stain   Final    FEW WBC PRESENT, PREDOMINANTLY MONONUCLEAR NO ORGANISMS SEEN    Report Status 11/18/2017 FINAL  Final  Culture, body fluid-bottle     Status: None   Collection Time: 11/18/17 12:48 PM  Result Value Ref Range Status   Specimen Description PLEURAL LEFT  Final   Special Requests NONE  Final   Culture NO GROWTH 5 DAYS  Final   Report Status 11/23/2017 FINAL  Final  Culture, expectorated sputum-assessment     Status: None   Collection Time: 11/23/17 10:40 AM  Result Value Ref Range Status   Specimen Description SPUTUM  Final   Special Requests Normal  Final   Sputum evaluation   Final    Sputum specimen not acceptable for testing.  Please recollect.   RESULT CALLED TO, READ BACK BY AND VERIFIED WITH: L  WORLEY RN 11/24/17 0446 JDW    Report Status 11/25/2017 FINAL  Final  Acid Fast Smear (AFB)     Status: None   Collection Time: 11/23/17 10:40 AM  Result Value Ref Range Status   AFB Specimen Processing Concentration  Final   Acid Fast Smear Negative  Final    Comment: (NOTE) Performed At: [facility] [address] [place], [place] [id number] Jolene Schimke MD WU:[id number]    Source (AFB) SPUTUM  Final  Gram stain     Status: None   Collection Time: 11/24/17 12:29 AM  Result Value Ref Range Status   Specimen Description FLUID PLEURAL LEFT  Final   Special Requests NONE  Final   Gram Stain   Final    FEW WBC PRESENT, PREDOMINANTLY PMN NO ORGANISMS SEEN    Report Status 11/25/2017 FINAL  Final  Acid Fast Smear (AFB)     Status: None   Collection Time: 11/24/17 12:29 AM  Result Value Ref Range Status   AFB Specimen Processing Concentration  Final   Acid Fast Smear Negative  Final    Comment: (NOTE) Performed At: [name] [address] [place], [place] [id number] Jolene Schimke MD VH:[id number]    Source (AFB) FLUID  Final    Comment: PLEURAL LEFT   Acid Fast Smear (AFB)     Status: None   Collection Time: 11/24/17  6:42 AM  Result Value Ref Range Status   AFB Specimen Processing Concentration  Final   Acid Fast Smear Negative  Final    Comment: (NOTE) Performed At: [facility] [address] [place], [place] [id number] Jolene Schimke MD [id number]    Source (AFB) EXPECTORATED SPUTUM  Final  Culture, blood (routine x 2)     Status: None (Preliminary result)   Collection Time: 11/29/17  1:24 PM  Result Value Ref Range Status   Specimen Description BLOOD LEFT FOREARM  Final   Special Requests IN PEDIATRIC BOTTLE Blood Culture adequate volume  Final   Culture   Final    NO GROWTH 2 DAYS Performed at [facility], [address] [place], [place] [address]    Report Status PENDING  Incomplete  Culture, blood (routine x 2)      Status: None (Preliminary result)   Collection Time: 11/29/17  1:27 PM  Result Value Ref Range Status   Specimen Description BLOOD LEFT HAND  Final   Special Requests IN PEDIATRIC BOTTLE Blood Culture adequate volume  Final   Culture   Final    NO GROWTH 2 DAYS Performed at [facility], [address] [place], [place] [address]    Report Status PENDING  Incomplete   Studies/Results: Dg Chest 2 View  Result Date: 11/30/2017 CLINICAL DATA:  Hypertension, asthma, dyspnea, chest pain, productive cough, BILATERAL pleural effusions post LEFT thoracentesis EXAM: CHEST  2 VIEW COMPARISON:  11/28/2017 FINDINGS: Enlargement of cardiac silhouette. Mediastinal contours and pulmonary vascularity normal. Persistent LEFT pleural effusion and basilar opacity question atelectasis versus consolidation. Tiny RIGHT pleural effusion. Remaining lungs clear. No additional infiltrate or pneumothorax. Bones unremarkable. IMPRESSION: Persistent LEFT pleural effusion with atelectasis versus consolidation at LEFT base. Enlargement of cardiac silhouette with tiny RIGHT pleural effusion. Electronically Signed   By: Ulyses Southward M.D.   On: 11/30/2017 13:35   Assessment/Plan:  INTERVAL HISTORY:  - Thoracentesis this AM with removed  - Leukocytosis down trending  - BC from 2/03 showing no growth to date  - Afebrile and hemodynamically stable  - Repeat CXR pending   Principal Problem:   PNA (pneumonia) Active Problems:   Respiratory failure (HCC)   Hypertension   Recurrent pleural effusion on left   S/P thoracentesis   Abnormal transaminases   Extrapulmonary tuberculosis  Kathreen Devoid a 46 y.o.male[place]iginally from [place] who presented with 3 weeks of progressive dyspnea, cough, and  pleuritic chest pain. CT chest on admission illustrated a L>R pleural effusion with compressive atelectasis vs infiltration with nonspecific enlarged lymph nodes.Pleural fluid analysis indicating an exudative process  with unclear etiology at this point.   - TB pleural effusion still on the differential, therefore, continue RIPE therapy.  - Send pleural fluid for repeat analysis with addition of MTB PCR (spoke to micro and they will send this out once they have received the pleural fluid).   LOS: 16 days   [facility] 12/02/2017, 9:45 AM

## 2017-12-02 NOTE — Progress Notes (Signed)
PROGRESS NOTE    Scott Rowe  ONG:[id number] DOB: 01-07-1971 DOA: 11/16/2017 PCP: Patient, No Pcp Per    Brief Narrative: Cort Scott Rowe is a 47 y.o. male with a history of HTN not on medications and asthma who presented to the ED with dyspnea, chest pain and productive cough. CT chest demonstrated L > R pleural effusions with compressive atelectasis vs. infiltrate with nonspecific enlarged LNs. He was hypoxic, started on levaquin and supplemental oxygen. Left thoracentesis of 1.4L on 1/23 showed exudate and repeat CTA ruled out PE. No definite mass noted. Cultures have remained negative to date. Cytology from effusion was negative. Thoracentesis repeated 1/29 and again 2-06.. We don't have clear cut diagnosis for pleural effusion. ID can not rule ou tuberculous Pleural effusion. Patient started on RIPE therapy. Evaluated by CVTS who relates VATS would be low yield to provide diagnosis.    He had thoracentesis 2-06. He is feeling better. I would repeat chest x ray in 48 hours prior to discharge.   I spoke with Dr Terrence Dupont regarding repeated ECHO which showed Diastolic dysfunction. Dr Terrence Dupont doesn't think left pleural effusion is related to HF.    Assessment & Plan:   Principal Problem:   PNA (pneumonia) Active Problems:   Respiratory failure (HCC)   Hypertension   Pleural effusion   S/P thoracentesis   Abnormal transaminases   Extrapulmonary tuberculosis   1-Acute hypoxic Respiratory failure; secondary to PNA and pleural effusion.  Received 5 days  IV antibiotics until 2-03 He has had two thoracentesis.  Pulmonary following.  Gold quantiferon negative WBC at 14.  Spike fever, blood culture pending.  Repeat chest x ray persistent left pleural effusion.  On RIPE therapy.   2-Pleural effusion, left exudative.  He has had thoracentesis times 3 Fluids, WBC 2850, gram stain negative, culture negative FAB times 3 negative, pleural fluids. QuantiFeron negative.   Autoimmune work up (RF  weakly positive at 15.8, ANA neg, ESR grossly elevated at 70),  Treated with  IV ceftriaxone and azithromycin. Finished treatment, discontinue by ID>  ADA negative.  ID consulted, due to positive PPD. Dr Tommy Medal is recommending tx for TB  Patient complaining today of worsening chest pain and dyspnea. Repeated chest x ray witn increase size pleural effusion, moderate size.  Per Dr Servando Snare VATS might be low yield.  Per ID ADA negative   Consulted CCM again for evaluation, for repeat thoracentesis vs pluerex cathe Needs repeat chest x ray in 48 hours.   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  Getting treatment for tb  Transaminases;  Increasing. Change Vicodin to oxycodone. Avoid tylenol.  Hepatitis panel negative.  Stable. Repeat in am.   Grade 2 diastolic dysfunction. On ECHO.  Patient needs to follow up with Dr Terrence Dupont out patient.    Paroxysmal atrial flutter: Echocardiogram shows normal atria. TSH 1.481.  Started ASA for CHA2DS2-VASc of 1 (HTN). On metoprolol and Cardizem.   Pericardial effusion: Small, not hemodynamically significant. No rub. Stable on serial CT's. Stable on ECHO repeated 2-06    DVT prophylaxis: SCD Code Status: full code.  Family Communication: care discussed with patient  Disposition Plan: to be determine  Consultants:   Pulmonology   Procedures:  Thoracentesis.    Antimicrobials: finished. ceftriaxone and azithromycin,   RIPE therapy    Subjective: He denies chest pain, he is feeling much better after the fluid was removed.      Objective: Vitals:   12/02/17 0900 12/02/17 0927 12/02/17 1103 12/02/17 1300  BP: 139/69 119/80 100/63 (P) 111/79  Pulse:   81 (P) 78  Resp:    (P) 20  Temp:    (P) 97.8 F (36.6 C)  TempSrc:    (P) Oral  SpO2:    (P) 99%  Weight:      Height:        Intake/Output Summary (Last 24 hours) at 12/02/2017 1358 Last data filed at 12/02/2017 0852 Gross per 24 hour  Intake 243 ml  Output 1100 ml  Net -857 ml     Filed Weights   11/30/17 0525 12/01/17 0438 12/02/17 0515  Weight: 88.4 kg (194 lb 14.4 oz) 87.5 kg (193 lb) 88.6 kg (195 lb 5.2 oz)    Examination:  General exam: NAD Respiratory system:  CTA Cardiovascular system: S 1, S 2 RRR Gastrointestinal system: BS present, soft, nt Central nervous system:non focal.  Extremities: symmetric power.  Skin:No rash.    Data Reviewed: I have personally reviewed following labs and imaging studies  CBC: Recent Labs  Lab 11/26/17 0618 11/27/17 0842 11/29/17 1323 12/02/17 0755  WBC 14.1* 14.7* 14.5* 11.2*  HGB 11.9* 11.8* 11.2* 10.9*  HCT 36.5* 36.3* 35.1* 33.7*  MCV 77.0* 77.1* 77.3* 76.9*  PLT 453* 463* 487* 276*   Basic Metabolic Panel: Recent Labs  Lab 11/26/17 0618 11/28/17 0941 12/02/17 0755  NA 137 136 137  K 4.3 4.2 4.0  CL 102 102 102  CO2 24 24 23   GLUCOSE 100* 115* 100*  BUN 12 18 14   CREATININE 0.95 1.13 0.97  CALCIUM 8.4* 8.6* 8.5*   GFR: Estimated Creatinine Clearance: 104.1 mL/min (by C-G formula based on SCr of 0.97 mg/dL). Liver Function Tests: Recent Labs  Lab 11/26/17 0618  AST 42*  ALT 94*  ALKPHOS 156*  BILITOT 0.6  PROT 7.1  ALBUMIN 2.3*   No results for input(s): LIPASE, AMYLASE in the last 168 hours. No results for input(s): AMMONIA in the last 168 hours. Coagulation Profile: No results for input(s): INR, PROTIME in the last 168 hours. Cardiac Enzymes: No results for input(s): CKTOTAL, CKMB, CKMBINDEX, TROPONINI in the last 168 hours. BNP (last 3 results) No results for input(s): PROBNP in the last 8760 hours. HbA1C: No results for input(s): HGBA1C in the last 72 hours. CBG: No results for input(s): GLUCAP in the last 168 hours. Lipid Profile: No results for input(s): CHOL, HDL, LDLCALC, TRIG, CHOLHDL, LDLDIRECT in the last 72 hours. Thyroid Function Tests: No results for input(s): TSH, T4TOTAL, FREET4, T3FREE, THYROIDAB in the last 72 hours. Anemia Panel: Recent Labs     12/01/17 0457  FERRITIN 506*   Sepsis Labs: No results for input(s): PROCALCITON, LATICACIDVEN in the last 168 hours.  Recent Results (from the past 240 hour(s))  Culture, expectorated sputum-assessment     Status: None   Collection Time: 11/23/17 10:40 AM  Result Value Ref Range Status   Specimen Description SPUTUM  Final   Special Requests Normal  Final   Sputum evaluation   Final    Sputum specimen not acceptable for testing.  Please recollect.   RESULT CALLED TO, READ BACK BY AND VERIFIED WITH: L WORLEY RN 11/24/17 0446 JDW    Report Status 11/25/2017 FINAL  Final  Acid Fast Smear (AFB)     Status: None   Collection Time: 11/23/17 10:40 AM  Result Value Ref Range Status   AFB Specimen Processing Concentration  Final   Acid Fast Smear Negative  Final    Comment: (NOTE) Performed At:  [facility] [place], [place] [id number] Rush Farmer MD [id number]    Source (AFB) SPUTUM  Final  Gram stain     Status: None   Collection Time: 11/24/17 12:29 AM  Result Value Ref Range Status   Specimen Description FLUID PLEURAL LEFT  Final   Special Requests NONE  Final   Gram Stain   Final    FEW WBC PRESENT, PREDOMINANTLY PMN NO ORGANISMS SEEN    Report Status 11/25/2017 FINAL  Final  Acid Fast Smear (AFB)     Status: None   Collection Time: 11/24/17 12:29 AM  Result Value Ref Range Status   AFB Specimen Processing Concentration  Final   Acid Fast Smear Negative  Final    Comment: (NOTE) Performed At: [facility] [place], [place] [id number] Rush Farmer MD ES:[id number]    Source (AFB) FLUID  Final    Comment: PLEURAL LEFT   Acid Fast Smear (AFB)     Status: None   Collection Time: 11/24/17  6:42 AM  Result Value Ref Range Status   AFB Specimen Processing Concentration  Final   Acid Fast Smear Negative  Final    Comment: (NOTE) Performed At: [facility] [address] [place], [place] [id number] Rush Farmer MD [id number]    Source (AFB) EXPECTORATED SPUTUM  Final  Culture, blood (routine x 2)     Status: None (Preliminary result)   Collection Time: 11/29/17  1:24 PM  Result Value Ref Range Status   Specimen Description BLOOD LEFT FOREARM  Final   Special Requests IN PEDIATRIC BOTTLE Blood Culture adequate volume  Final   Culture   Final    NO GROWTH 2 DAYS Performed at [facility], [place] [address] [place], [place] [address]    Report Status PENDING  Incomplete  Culture, blood (routine x 2)     Status: None (Preliminary result)   Collection Time: 11/29/17  1:27 PM  Result Value Ref Range Status   Specimen Description BLOOD LEFT HAND  Final   Special Requests IN PEDIATRIC BOTTLE Blood Culture adequate volume  Final   Culture   Final    NO GROWTH 2 DAYS Performed at [facility], [place] [address], [place],  [address]    Report Status PENDING  Incomplete         Radiology Studies: Dg Chest Port 1 View  Result Date: 12/02/2017 CLINICAL DATA:  Post left thoracentesis EXAM: PORTABLE CHEST 1 VIEW COMPARISON:  11/30/2017 FINDINGS: Cardiomegaly with vascular congestion. Decreasing left effusion following thoracentesis. No pneumothorax. Continued left lower lobe airspace opacity. Right lung clear. IMPRESSION: No pneumothorax following left thoracentesis. Left basilar atelectasis or infiltrate. Electronically Signed   By: Rolm Baptise M.D.   On: 12/02/2017 10:23   US Thoracentesis Asp Pleural Space W/img Guide  Result Date: 12/02/2017 INDICATION: Patient with left pleural effusion. Request is made for diagnostic and therapeutic thoracentesis. EXAM: ULTRASOUND GUIDED DIAGNOSTIC AND THERAPEUTIC LEFT THORACENTESIS MEDICATIONS: 10 mL 1% lidocaine COMPLICATIONS: None immediate. PROCEDURE: An ultrasound guided thoracentesis was thoroughly discussed with the patient and questions answered. The benefits, risks, alternatives and complications were also discussed. The patient  understands and wishes to proceed with the procedure. Written consent was obtained. Ultrasound was performed to localize and mark an adequate pocket of fluid in the left chest. The area was then prepped and draped in the normal sterile fashion. 1% Lidocaine was used for local anesthesia. Under ultrasound guidance a Safe-T-Centesis  catheter was introduced. Thoracentesis was performed. The catheter was removed and a dressing applied. FINDINGS: A total of approximately 600 mL of yellow fluid was removed. Samples were sent to the laboratory as requested by the clinical team. IMPRESSION: Successful ultrasound guided diagnostic and therapeutic left thoracentesis yielding 600 mL of pleural fluid. Read by: Brynda Greathouse PA-C Electronically Signed   By: Aletta Edouard M.D.   On: 12/02/2017 10:35        Scheduled Meds: . aspirin EC  81 mg Oral Daily  . diltiazem  120 mg Oral BID  . enoxaparin (LOVENOX) injection  40 mg Subcutaneous Q24H  . ethambutol  1,600 mg Oral Daily  . ibuprofen  600 mg Oral TID  . isoniazid  300 mg Oral Daily  . lidocaine (PF)      . metoprolol tartrate  100 mg Oral BID  . pyrazinamide  2,000 mg Oral Daily  . vitamin B-6  50 mg Oral Daily  . rifampin  600 mg Oral Daily  . sodium chloride flush  3 mL Intravenous Q12H   Continuous Infusions: . sodium chloride       LOS: 16 days    Time spent: 35 minutes.     Elmarie Shiley, MD [facility] Pager [phone]  If 7PM-7AM, please contact night-coverage [email or web address] Password [email or web address] 12/02/2017, 1:58 PM

## 2017-12-02 NOTE — Progress Notes (Signed)
  Echocardiogram 2D Echocardiogram Limited has been performed.  Leta JunglingCooper, Anjannette Gauger M 12/02/2017, 7:32 AM

## 2017-12-02 NOTE — Progress Notes (Signed)
Name: Scott Rowe MRN: [id number] DOB: 05/09/1971    ADMISSION DATE:  11/16/2017 CONSULTATION DATE: November 19, 2017  REFERRING MD : Hospitalist service  CHIEF COMPLAINT: Shortness of breath cough  BRIEF PATIENT DESCRIPTION:  47 year old with past medical history of hypertension  Admitted on 1/21 with dyspnea, chest pain.  Noted to have large left pleural effusion status post thoracentesis on 1/23 with removal of 1.4 L, repeat thoracentesis on 1/29 with removal of 800 mL.  The etiology of the effusion is unclear.  Labs tests show it is exudative in nature with negative cultures and cytology.  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 Reconsult 2/5 for persistent effusion with chest pain and dyspnea. He has been seen by CVTS and he is not a candidate for VATS surgery or biopsy. Autoimmune workup negative except for mild elevation in rheumatoid factor.  EVENTS 1/23 - pleural fluid - exudative with LDH 237 , protien  4.8 and glucose not avail. Mesothelial cells +, 69% lymphs 1/29  - pleural efluid with Gluc 122, LDH 130, and 69% polys and mesothelia cells +. AFB smear negative. 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  SUBJECTIVE/OVERNIGHT/INTERVAL HX  VITAL SIGNS: Temp:  [97.8 F (36.6 C)-98.5 F (36.9 C)] (P) 97.8 F (36.6 C) (02/06 1300) Pulse Rate:  [78-97] (P) 78 (02/06 1300) Resp:  [18-20] (P) 20 (02/06 1300) BP: (100-147)/(63-98) (P) 111/79 (02/06 1300) SpO2:  [97 %-99 %] (P) 99 % (02/06 1300) Weight:  [195 lb 5.2 oz (88.6 kg)] 195 lb 5.2 oz (88.6 kg) (02/06 0515)  PHYSICAL EXAMINATION: Blood pressure (P) 111/79, pulse (P) 78, temperature (P) 97.8 F (36.6 C), temperature source (P) Oral, resp. rate (P) 20, height 5\' 8"  (1.727 m), weight 195 lb 5.2 oz (88.6 kg), SpO2 (P) 99 %. Gen:      No acute distress HEENT:  EOMI, sclera anicteric Neck:      No masses; no thyromegaly Lungs:    Clear to auscultation bilaterally; normal respiratory effort CV:         Regular rate and rhythm; no murmurs Abd:      + bowel sounds; soft, non-tender; no palpable masses, no distension Ext:    No edema; adequate peripheral perfusion Skin:      Warm and dry; no rash Neuro: alert and oriented x 3 Psych: normal mood and affect  PULMONARY No results for input(s): PHART, PCO2ART, PO2ART, HCO3, TCO2, O2SAT in the last 168 hours.  Invalid input(s): PCO2, PO2  CBC Recent Labs  Lab 11/27/17 0842 11/29/17 1323 12/02/17 0755  HGB 11.8* 11.2* 10.9*  HCT 36.3* 35.1* 33.7*  WBC 14.7* 14.5* 11.2*  PLT 463* 487* 429*    COAGULATION No results for input(s): INR in the last 168 hours.  CARDIAC  No results for input(s): TROPONINI in the last 168 hours. No results for input(s): PROBNP in the last 168 hours.   CHEMISTRY Recent Labs  Lab 11/26/17 0618 11/28/17 0941 12/02/17 0755  NA 137 136 137  K 4.3 4.2 4.0  CL 102 102 102  CO2 24 24 23   GLUCOSE 100* 115* 100*  BUN 12 18 14   CREATININE 0.95 1.13 0.97  CALCIUM 8.4* 8.6* 8.5*   Estimated Creatinine Clearance: 104.1 mL/min (by C-G formula based on SCr of 0.97 mg/dL).   LIVER Recent Labs  Lab 11/26/17 0618  AST 42*  ALT 94*  ALKPHOS  156*  BILITOT 0.6  PROT 7.1  ALBUMIN 2.3*     INFECTIOUS No results for input(s): LATICACIDVEN, PROCALCITON in the last 168 hours.   ENDOCRINE CBG (last 3)  No results for input(s): GLUCAP in the last 72 hours.   IMAGING x48h  - image(s) personally visualized  -   highlighted in bold Dg Chest Port 1 View  Result Date: 12/02/2017 CLINICAL DATA:  Post left thoracentesis EXAM: PORTABLE CHEST 1 VIEW COMPARISON:  11/30/2017 FINDINGS: Cardiomegaly with vascular congestion. Decreasing left effusion following thoracentesis. No pneumothorax. Continued left lower lobe airspace opacity. Right lung clear. IMPRESSION: No pneumothorax following left  thoracentesis. Left basilar atelectasis or infiltrate. Electronically Signed   By: Charlett Nose M.D.   On: 12/02/2017 10:23   US Thoracentesis Asp Pleural Space W/img Guide  Result Date: 12/02/2017 INDICATION: Patient with left pleural effusion. Request is made for diagnostic and therapeutic thoracentesis. EXAM: ULTRASOUND GUIDED DIAGNOSTIC AND THERAPEUTIC LEFT THORACENTESIS MEDICATIONS: 10 mL 1% lidocaine COMPLICATIONS: None immediate. PROCEDURE: An ultrasound guided thoracentesis was thoroughly discussed with the patient and questions answered. The benefits, risks, alternatives and complications were also discussed. The patient understands and wishes to proceed with the procedure. Written consent was obtained. Ultrasound was performed to localize and mark an adequate pocket of fluid in the left chest. The area was then prepped and draped in the normal sterile fashion. 1% Lidocaine was used for local anesthesia. Under ultrasound guidance a Safe-T-Centesis catheter was introduced. Thoracentesis was performed. The catheter was removed and a dressing applied. FINDINGS: A total of approximately 600 mL of yellow fluid was removed. Samples were sent to the laboratory as requested by the clinical team. IMPRESSION: Successful ultrasound guided diagnostic and therapeutic left thoracentesis yielding 600 mL of pleural fluid. Read by: Loyce Dys PA-C Electronically Signed   By: Irish Lack M.D.   On: 12/02/2017 10:35  I have reviewed the images personally  Bedside ultrasound 2/5 Left effusion     ASSESSMENT / PLAN: Left exudative pleural effusion  Diagnosis is parapneumonic versus tuberculous effusion.  There is no evidence of malignancy and low suspicion for autoimmune process Underwent third thoracentesis today with removal of 600 cc of fluid Overall effusion appears to be improving with decreasing LDH indicating that the inflammation is getting better.  Suspect that the fluid that was removed today  was residual fluid left behind after incomplete drainage the previous 2 times.  Hopefully he would not accumulate more effusion.  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 - NSAIDs for pain and antiinflammatory effect. Agree with holding steroids.  - Can get a chest x-ray in a few days as the patient is anxious that the fluid will re accumulate. - He will need follow-up chest x-ray in 2-4 weeks as an outpatient.  Chilton Greathouse MD [facility] Pager [phone] If no answer or after 3pm call: [phone] 12/02/2017, 3:31 PM

## 2017-12-02 NOTE — Progress Notes (Signed)
Patient refused bed alarm. Will continue to monitor patient. 

## 2017-12-03 ENCOUNTER — Inpatient Hospital Stay: Payer: Self-pay | Admitting: Acute Care

## 2017-12-03 DIAGNOSIS — R079 Chest pain, unspecified: Secondary | ICD-10-CM

## 2017-12-03 LAB — BASIC METABOLIC PANEL
ANION GAP: 13 (ref 5–15)
BUN: 15 mg/dL (ref 6–20)
CALCIUM: 8.2 mg/dL — AB (ref 8.9–10.3)
CHLORIDE: 104 mmol/L (ref 101–111)
CO2: 22 mmol/L (ref 22–32)
Creatinine, Ser: 0.95 mg/dL (ref 0.61–1.24)
GFR calc non Af Amer: >60 mL/min (ref >60)
Glucose, Bld: 114 mg/dL — ABNORMAL HIGH (ref 65–99)
Potassium: 4.1 mmol/L (ref 3.5–5.1)
Sodium: 139 mmol/L (ref 135–145)

## 2017-12-03 LAB — CBC
HEMATOCRIT: 34.3 % — AB (ref 39.0–52.0)
HEMOGLOBIN: 10.9 g/dL — AB (ref 13.0–17.0)
MCH: 24.7 pg — ABNORMAL LOW (ref 26.0–34.0)
MCHC: 31.8 g/dL (ref 30.0–36.0)
MCV: 77.6 fL — ABNORMAL LOW (ref 78.0–100.0)
Platelets: 407 10*3/uL — ABNORMAL HIGH (ref 150–400)
RBC: 4.42 MIL/uL (ref 4.22–5.81)
RDW: 13.5 % (ref 11.5–15.5)
WBC: 12.7 10*3/uL — AB (ref 4.0–10.5)

## 2017-12-03 LAB — ACID FAST SMEAR (AFB, MYCOBACTERIA): Acid Fast Smear: NEGATIVE

## 2017-12-03 NOTE — Plan of Care (Signed)
  Coping: Level of anxiety will decrease 12/03/2017 0842 - Completed/Met by Evert Kohl, RN   Elimination: Will not experience complications related to bowel motility 12/03/2017 0842 - Completed/Met by Evert Kohl, RN

## 2017-12-03 NOTE — Progress Notes (Signed)
Patient Demographics:    Scott Rowe, is a 47 y.o. male, DOB - 04-27-1971, WHQ:[id number]  Admit date - 11/16/2017   Admitting Physician Phillips Grout, MD  Outpatient Primary MD for the patient is Patient, No Pcp Per  LOS - 32   Chief Complaint  Patient presents with  . Chest Pain  . Shortness of Breath        Subjective:    Scott Rowe today has no fevers, no emesis,  No chest pain,  No new complaints, no night sweats   Assessment  & Plan :    Principal Problem:   PNA (pneumonia) Active Problems:   Respiratory failure (HCC)   Hypertension   Pleural effusion   S/P thoracentesis   Abnormal transaminases   Extrapulmonary tuberculosis   Chest pain   1)Acute Hypoxic Respiratory Failure-presumed pneumonia recurrent exudative pleural effusion-status post thoracentesis x2, repeat chest x-ray planned for 2 weeks 2019, Per Dr Servando Snare VATS might be low yield. Per ID ADA negative, FAB times 3 negative, pleural fluids. QuantiFeron negative. 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 aspirin EC  81 mg Oral Daily  . diltiazem  120 mg Oral BID  . enoxaparin (LOVENOX) injection  40 mg Subcutaneous Q24H  . ethambutol  1,600 mg Oral Daily  . ibuprofen  600 mg Oral TID  . isoniazid   300 mg Oral Daily  . metoprolol tartrate  100 mg Oral BID  . pyrazinamide  2,000 mg Oral Daily  . vitamin B-6  50 mg Oral Daily  . rifampin  600 mg Oral Daily  . sodium chloride flush  3 mL Intravenous Q12H   Continuous Infusions: . sodium chloride     PRN Meds:.sodium chloride, acetaminophen **OR** acetaminophen, hydrALAZINE, lidocaine, menthol-cetylpyridinium, morphine injection, ondansetron **OR** ondansetron (ZOFRAN) IV, oxyCODONE, sodium chloride flush    Anti-infectives (From admission, onward)   Start     Dose/Rate Route Frequency Ordered Stop   11/29/17 1300  cefTRIAXone (ROCEPHIN) 1 g in dextrose 5 % 50 mL IVPB  Status:  Discontinued     1 g 100 mL/hr over 30 Minutes Intravenous Every 24 hours 11/29/17 1259 11/30/17 1139   11/27/17 1400  isoniazid (NYDRAZID) tablet 300 mg     300 mg Oral Daily 11/27/17 1302     11/27/17 1400  rifampin (RIFADIN) capsule 600 mg     600 mg Oral Daily 11/27/17 1302     11/27/17 1400  pyrazinamide tablet 2,000 mg     2,000 mg Oral Daily 11/27/17 1302     11/27/17 1400  ethambutol (MYAMBUTOL) tablet 1,600 mg  1,600 mg Oral Daily 11/27/17 1302     11/25/17 1400  azithromycin (ZITHROMAX) tablet 500 mg  Status:  Discontinued     500 mg Oral Daily 11/25/17 1314 11/27/17 1302   11/23/17 1200  cefTRIAXone (ROCEPHIN) 1 g in dextrose 5 % 50 mL IVPB  Status:  Discontinued     1 g 100 mL/hr over 30 Minutes Intravenous Daily 11/23/17 1113 11/27/17 1302   11/23/17 1115  azithromycin (ZITHROMAX) 500 mg in dextrose 5 % 250 mL IVPB  Status:  Discontinued     500 mg 250 mL/hr over 60 Minutes Intravenous Every 24 hours 11/23/17 1042 11/25/17 1313   11/18/17 1500  levofloxacin (LEVAQUIN) tablet 500 mg  Status:  Discontinued     500 mg Oral Daily 11/17/17 1550 11/22/17 1406   11/16/17 1530  levofloxacin (LEVAQUIN) IVPB 750 mg  Status:  Discontinued     750 mg 100 mL/hr over 90 Minutes Intravenous Every 24 hours 11/16/17 1520 11/17/17 1550   11/16/17 1400   levofloxacin (LEVAQUIN) IVPB 750 mg     750 mg 100 mL/hr over 90 Minutes Intravenous  Once 11/16/17 1357 11/16/17 1616        Objective:   Vitals:   12/02/17 1103 12/02/17 1300 12/02/17 2222 12/03/17 0513  BP: 100/63 111/79 134/83 128/88  Pulse: 81 78 89 76  Resp:  20 18 18   Temp:  97.8 F (36.6 C)  98.9 F (37.2 C)  TempSrc:  Oral Oral Oral  SpO2:  99% 98% 99%  Weight:    88.8 kg (195 lb 11.2 oz)  Height:        Wt Readings from Last 3 Encounters:  12/03/17 88.8 kg (195 lb 11.2 oz)     Intake/Output Summary (Last 24 hours) at 12/03/2017 1942 Last data filed at 12/03/2017 1234 Gross per 24 hour  Intake 596 ml  Output 700 ml  Net -104 ml     Physical Exam  Gen:- Awake Alert,  In no apparent distress  HEENT:- [place].AT, No sclera icterus Neck-Supple Neck,No JVD,.  Lungs-diminished in bases left more than right, no wheezing or rhonchi CV- S1, S2 normal Abd-  +ve B.Sounds, Abd Soft, No tenderness,    Extremity/Skin:- No  edema,       Data Review:   Micro Results Recent Results (from the past 240 hour(s))  Gram stain     Status: None   Collection Time: 11/24/17 12:29 AM  Result Value Ref Range Status   Specimen Description FLUID PLEURAL LEFT  Final   Special Requests NONE  Final   Gram Stain   Final    FEW WBC PRESENT, PREDOMINANTLY PMN NO ORGANISMS SEEN    Report Status 11/25/2017 FINAL  Final  Acid Fast Smear (AFB)     Status: None   Collection Time: 11/24/17 12:29 AM  Result Value Ref Range Status   AFB Specimen Processing Concentration  Final   Acid Fast Smear Negative  Final    Comment: (NOTE) Performed At: [facility] [place], [place] [id number] Rush Farmer MD [id number]    Source (AFB) FLUID  Final    Comment: PLEURAL LEFT   Acid Fast Smear (AFB)     Status: None   Collection Time: 11/24/17  6:42 AM  Result Value Ref Range Status   AFB Specimen Processing Concentration  Final   Acid Fast Smear Negative  Final     Comment: (NOTE) Performed At: [place], [place]  [id number] Rush Farmer MD [id number]    Source (AFB) EXPECTORATED SPUTUM  Final  Culture, blood (routine x 2)     Status: None (Preliminary result)   Collection Time: 11/29/17  1:24 PM  Result Value Ref Range Status   Specimen Description BLOOD LEFT FOREARM  Final   Special Requests IN PEDIATRIC BOTTLE Blood Culture adequate volume  Final   Culture   Final    NO GROWTH 4 DAYS Performed at [facility], [place] [address], [place], [place] [address]    Report Status PENDING  Incomplete  Culture, blood (routine x 2)     Status: None (Preliminary result)   Collection Time: 11/29/17  1:27 PM  Result Value Ref Range Status   Specimen Description BLOOD LEFT HAND  Final   Special Requests IN PEDIATRIC BOTTLE Blood Culture adequate volume  Final   Culture   Final    NO GROWTH 4 DAYS Performed at [facility], [place] [address] [place], [place] [address]    Report Status PENDING  Incomplete  Gram stain     Status: None   Collection Time: 12/02/17 10:05 AM  Result Value Ref Range Status   Specimen Description FLUID PLEURAL LEFT  Final   Special Requests NONE  Final   Gram Stain   Final    RARE WBC PRESENT, PREDOMINANTLY PMN NO ORGANISMS SEEN Performed at [facility], [place] [address], [place], [place] [address]    Report Status 12/02/2017 FINAL  Final  Acid Fast Smear (AFB)     Status: None   Collection Time: 12/02/17 10:05 AM  Result Value Ref Range Status   AFB Specimen Processing Concentration  Final   Acid Fast Smear Negative  Final    Comment: (NOTE) Performed At: [facility] [place], [place] [id number] Rush Farmer MD [id number]    Source (AFB) FLUID  Final    Comment: PLEURAL LEFT   Culture, body fluid-bottle     Status: None (Preliminary result)   Collection Time: 12/02/17 10:05 AM  Result Value Ref Range Status   Specimen Description  FLUID PLEURAL LEFT  Final   Special Requests NONE  Final   Culture   Final    NO GROWTH 1 DAY Performed at [facility], [address] [place], [place] [address]    Report Status PENDING  Incomplete    Radiology Reports Dg Chest 1 View  Result Date: 11/24/2017 CLINICAL DATA:  47 year old male with a history of recent left-sided thoracentesis EXAM: CHEST 1 VIEW COMPARISON:  Chest x-ray 11/23/2017, CT 11/23/2017 FINDINGS: Cardiomediastinal silhouette unchanged. Improved aeration at the left base. Blunting of left costophrenic angle. No pneumothorax. No confluent airspace disease. IMPRESSION: Improved aeration at the left base with decreased pleural effusion and no evidence of pneumothorax. Electronically Signed   By: Corrie Mckusick D.O.   On: 11/24/2017 14:30   Dg Chest 1 View  Result Date: 11/18/2017 CLINICAL DATA:  Post left-sided thoracentesis. History of hypertension. EXAM: CHEST 1 VIEW COMPARISON:  11/16/2017; chest CT-11/16/2010 FINDINGS: Interval reduction/near resolution of residual trace left-sided effusion post thoracentesis. No pneumothorax. Unchanged trace right-sided pleural effusion. Improved aeration of the left lower lung with persistent left mid and basilar heterogeneous/consolidative opacities. Pulmonary vasculature remains indistinct with cephalization of flow. No acute osseus abnormalities. IMPRESSION: 1. Interval reduction/near resolution of left-sided effusion post thoracentesis. No pneumothorax. 2. Improved aeration of left lower lung with similar findings of suspected pulmonary edema and bibasilar atelectasis.  Electronically Signed   By: Sandi Mariscal M.D.   On: 11/18/2017 12:58   Dg Chest 2 View  Result Date: 11/30/2017 CLINICAL DATA:  Hypertension, asthma, dyspnea, chest pain, productive cough, BILATERAL pleural effusions post LEFT thoracentesis EXAM: CHEST  2 VIEW COMPARISON:  11/28/2017 FINDINGS: Enlargement of cardiac silhouette. Mediastinal contours and pulmonary  vascularity normal. Persistent LEFT pleural effusion and basilar opacity question atelectasis versus consolidation. Tiny RIGHT pleural effusion. Remaining lungs clear. No additional infiltrate or pneumothorax. Bones unremarkable. IMPRESSION: Persistent LEFT pleural effusion with atelectasis versus consolidation at LEFT base. Enlargement of cardiac silhouette with tiny RIGHT pleural effusion. Electronically Signed   By: Lavonia Dana M.D.   On: 11/30/2017 13:35   Dg Chest 2 View  Result Date: 11/26/2017 CLINICAL DATA:  Pleural effusions.  Recent left thoracentesis. EXAM: CHEST  2 VIEW COMPARISON:  11/25/2017 FINDINGS: The heart size and mediastinal contours are within normal limits. Small left pleural effusion shows no significant change in size. There is persistent atelectasis or consolidation in the left lower lobe. No evidence of pneumothorax. Right lung is clear. IMPRESSION: No significant change in small left pleural effusion and left lower lobe atelectasis versus consolidation. Electronically Signed   By: Earle Gell M.D.   On: 11/26/2017 09:05   Dg Chest 2 View  Result Date: 11/16/2017 CLINICAL DATA:  Chest pain and shortness of breath for several weeks EXAM: CHEST  2 VIEW COMPARISON:  None. FINDINGS: Cardiac shadow is mildly enlarged. Small right-sided pleural effusion is seen. A large left-sided pleural effusion is noted with likely underlying infiltrate. No pneumothorax is seen. No bony abnormality is noted. IMPRESSION: Large left pleural effusion with likely underlying infiltrate. Minimal blunting of the right costophrenic angle is noted as well. Electronically Signed   By: Inez Catalina M.D.   On: 11/16/2017 11:55   Ct Chest W Contrast  Result Date: 11/23/2017 CLINICAL DATA:  Pleural effusion follow up EXAM: CT CHEST WITH CONTRAST TECHNIQUE: Multidetector CT imaging of the chest was performed during intravenous contrast administration. CONTRAST:  75 cc ISOVUE-300 IOPAMIDOL (ISOVUE-300) INJECTION  61% COMPARISON:  CT chest dated 11/18/2017. chest CT dated 11/16/2017. FINDINGS: Cardiovascular: No thoracic aortic aneurysm or evidence of aortic dissection. Cardiomegaly. Small pericardial effusion, similar to previous exam. Mediastinum/Nodes: Stable mildly prominent lymph node within the right lower paratracheal space, measuring 12 mm short axis dimension. Additional stable mildly prominent lymph node within the subcarinal space, also measuring approximately 12 mm short axis dimension. Additional scattered smaller lymph nodes within the mediastinum. No new lymphadenopathy. Esophagus appears normal.  Trachea appears normal. Lungs/Pleura: Moderate-sized left pleural effusion, with adjacent compressive atelectasis, increased compared to the chest CT of 11/18/2017, less than the pleural effusion that was seen on 11/16/2017. Small right pleural effusion with adjacent compressive atelectasis. Lungs otherwise clear.  No pneumothorax. Upper Abdomen: No acute findings within the upper abdomen. Musculoskeletal: No acute or suspicious osseous finding. IMPRESSION: 1. Moderate-sized left pleural effusion, with adjacent compressive atelectasis. The pleural effusion has reaccumulated since chest CT of 11/18/2017 but is not as large as the pleural effusion seen on earlier chest CT of 11/16/2017. 2. Small right pleural effusion, similar to previous exams. 3. Small pericardial effusion, similar to previous exam. Stable cardiomegaly. 4. Stable mild lymphadenopathy within the mediastinum, as detailed above. Electronically Signed   By: Franki Cabot M.D.   On: 11/23/2017 23:38   Ct Chest W Contrast  Result Date: 11/16/2017 CLINICAL DATA:  RIGHT upper quadrant RIGHT-side chest pain with shortness of breath  for 2 weeks, severe chest pain when lying flat, unable to raise RIGHT arm above head, shortness of breath for 2 weeks, history hypertension EXAM: CT CHEST, ABDOMEN, AND PELVIS WITH CONTRAST TECHNIQUE: Multidetector CT imaging of  the chest, abdomen and pelvis was performed following the standard protocol during bolus administration of intravenous contrast. Sagittal and coronal MPR images reconstructed from axial data set. Patient was unable to raise his RIGHT arm above his head, resulting in beam hardening artifacts at the chest and abdomen. CONTRAST:  179m ISOVUE-300 IOPAMIDOL (ISOVUE-300) INJECTION 61% IV. No oral contrast administered. COMPARISON:  None FINDINGS: CT CHEST FINDINGS Cardiovascular: Thoracic vascular structures grossly patent on nondedicated exam. Small pericardial effusion. Mediastinum/Nodes: Normal sized RIGHT cardiophrenic angle lymph nodes. Enlarged azygo-esophageal recess node 16 mm short axis image 32. Minimally enlarged RIGHT paratracheal node 11 mm short axis image 26. No definite esophageal abnormalities. Base of cervical region unremarkable. Lungs/Pleura: Large LEFT pleural effusion with subtotal atelectasis of LEFT lower lobe and partial atelectasis of LEFT upper lobe in lingula. Small RIGHT pleural effusion with minimal compressive atelectasis of adjacent posterior RIGHT lower lobe base. Scattered respiratory motion artifacts. No definite infiltrate or pneumothorax. Musculoskeletal: No acute osseous abnormalities. CT ABDOMEN PELVIS FINDINGS Hepatobiliary: Gallbladder and liver normal appearance Pancreas: Normal appearance Spleen: Normal appearance Adrenals/Urinary Tract: Adrenal glands, kidneys, ureters, and bladder normal appearance Stomach/Bowel: Normal appearing retrocecal appendix. Distal stomach incompletely distended. Stomach and bowel loops otherwise normal appearance for technique Vascular/Lymphatic: Vascular structures normal appearance. No adenopathy. Reproductive: Minimal prostatic enlargement. Other: No free air, free fluid, hernia, or inflammatory process Musculoskeletal: No acute osseous abnormalities. IMPRESSION: Large LEFT pleural effusion with significant atelectasis of the LEFT lung. Small  RIGHT pleural effusion with minimal atelectasis of the adjacent RIGHT lower lobe. Nonspecific mildly enlarged mediastinal lymph nodes. No acute intra-abdominal or intrapelvic abnormalities. No acute osseous abnormalities. Electronically Signed   By: MLavonia DanaM.D.   On: 11/16/2017 13:53   Ct Angio Chest Pe W Or Wo Contrast  Result Date: 11/18/2017 CLINICAL DATA:  Shortness of breath.  Recent thoracentesis. EXAM: CT ANGIOGRAPHY CHEST WITH CONTRAST TECHNIQUE: Multidetector CT imaging of the chest was performed using the standard protocol during bolus administration of intravenous contrast. Multiplanar CT image reconstructions and MIPs were obtained to evaluate the vascular anatomy. CONTRAST:  563mISOVUE-370 IOPAMIDOL (ISOVUE-370) INJECTION 76% COMPARISON:  Chest radiograph 11/18/2017 FINDINGS: Cardiovascular: Contrast injection is sufficient to demonstrate satisfactory opacification of the pulmonary arteries to the segmental level. There is no pulmonary embolus. The main pulmonary artery is within normal limits for size. There is a normal 3-vessel arch branching pattern without evidence of acute aortic syndrome. There is noaortic atherosclerosis. Heart size is enlarged. Small pericardial effusion is unchanged. Mediastinum/Nodes: No mediastinal, hilar or axillary lymphadenopathy. The visualized thyroid and thoracic esophageal course are unremarkable. Lungs/Pleura: Ground-glass opacities throughout the lingula and left lower lobe with markedly decreased size of pleural effusion, which is now small. Unchanged medium-sized right pleural effusion. Mild right basilar atelectasis. No pneumothorax. Upper Abdomen: Contrast bolus timing is not optimized for evaluation of the abdominal organs. Within this limitation, the visualized organs of the upper abdomen are normal. Musculoskeletal: No chest wall abnormality. No acute or significant osseous findings. Review of the MIP images confirms the above findings. IMPRESSION:  1. No pulmonary embolus. 2. Greatly decreased size of left pleural effusion following thoracentesis with multifocal ground-glass opacity throughout the lingula and left lower lobe likely indicating re-expansion pulmonary edema. 3. No pneumothorax. 4. Small pericardial effusion, unchanged.  Electronically Signed   By: Ulyses Jarred M.D.   On: 11/18/2017 18:48   Ct Abdomen Pelvis W Contrast  Result Date: 11/16/2017 CLINICAL DATA:  RIGHT upper quadrant RIGHT-side chest pain with shortness of breath for 2 weeks, severe chest pain when lying flat, unable to raise RIGHT arm above head, shortness of breath for 2 weeks, history hypertension EXAM: CT CHEST, ABDOMEN, AND PELVIS WITH CONTRAST TECHNIQUE: Multidetector CT imaging of the chest, abdomen and pelvis was performed following the standard protocol during bolus administration of intravenous contrast. Sagittal and coronal MPR images reconstructed from axial data set. Patient was unable to raise his RIGHT arm above his head, resulting in beam hardening artifacts at the chest and abdomen. CONTRAST:  123m ISOVUE-300 IOPAMIDOL (ISOVUE-300) INJECTION 61% IV. No oral contrast administered. COMPARISON:  None FINDINGS: CT CHEST FINDINGS Cardiovascular: Thoracic vascular structures grossly patent on nondedicated exam. Small pericardial effusion. Mediastinum/Nodes: Normal sized RIGHT cardiophrenic angle lymph nodes. Enlarged azygo-esophageal recess node 16 mm short axis image 32. Minimally enlarged RIGHT paratracheal node 11 mm short axis image 26. No definite esophageal abnormalities. Base of cervical region unremarkable. Lungs/Pleura: Large LEFT pleural effusion with subtotal atelectasis of LEFT lower lobe and partial atelectasis of LEFT upper lobe in lingula. Small RIGHT pleural effusion with minimal compressive atelectasis of adjacent posterior RIGHT lower lobe base. Scattered respiratory motion artifacts. No definite infiltrate or pneumothorax. Musculoskeletal: No acute  osseous abnormalities. CT ABDOMEN PELVIS FINDINGS Hepatobiliary: Gallbladder and liver normal appearance Pancreas: Normal appearance Spleen: Normal appearance Adrenals/Urinary Tract: Adrenal glands, kidneys, ureters, and bladder normal appearance Stomach/Bowel: Normal appearing retrocecal appendix. Distal stomach incompletely distended. Stomach and bowel loops otherwise normal appearance for technique Vascular/Lymphatic: Vascular structures normal appearance. No adenopathy. Reproductive: Minimal prostatic enlargement. Other: No free air, free fluid, hernia, or inflammatory process Musculoskeletal: No acute osseous abnormalities. IMPRESSION: Large LEFT pleural effusion with significant atelectasis of the LEFT lung. Small RIGHT pleural effusion with minimal atelectasis of the adjacent RIGHT lower lobe. Nonspecific mildly enlarged mediastinal lymph nodes. No acute intra-abdominal or intrapelvic abnormalities. No acute osseous abnormalities. Electronically Signed   By: MLavonia DanaM.D.   On: 11/16/2017 13:53   Dg Chest Port 1 View  Result Date: 12/02/2017 CLINICAL DATA:  Post left thoracentesis EXAM: PORTABLE CHEST 1 VIEW COMPARISON:  11/30/2017 FINDINGS: Cardiomegaly with vascular congestion. Decreasing left effusion following thoracentesis. No pneumothorax. Continued left lower lobe airspace opacity. Right lung clear. IMPRESSION: No pneumothorax following left thoracentesis. Left basilar atelectasis or infiltrate. Electronically Signed   By: KRolm BaptiseM.D.   On: 12/02/2017 10:23   Dg Chest Port 1 View  Result Date: 11/28/2017 CLINICAL DATA:  Dyspnea. EXAM: PORTABLE CHEST 1 VIEW COMPARISON:  11/26/2017 FINDINGS: Cardiomediastinal silhouette is enlarged. There is persistent left lower lobe airspace consolidation with moderate in size left pleural effusion. Small right subpulmonic pleural effusion cannot be excluded. No evidence of pneumothorax. Osseous structures are without acute abnormality. Soft tissues are  grossly normal. IMPRESSION: Persistent left lower lobe airspace consolidation and moderate in size left pleural effusion. Probable small subpulmonic right pleural effusion. Enlarged cardiac silhouette. Electronically Signed   By: DFidela SalisburyM.D.   On: 11/28/2017 09:35   Dg Chest Port 1 View  Result Date: 11/25/2017 CLINICAL DATA:  Left-sided thoracentesis yesterday EXAM: PORTABLE CHEST 1 VIEW COMPARISON:  11/24/2017 FINDINGS: Slight interval increase in left base atelectasis with probable small left effusion. Right lung remains clear. No evidence for pneumothorax The cardio pericardial silhouette is enlarged. The visualized bony structures  of the thorax are intact. Telemetry leads overlie the chest. IMPRESSION: Slight increase in left base atelectasis/effusion. Electronically Signed   By: Misty Stanley M.D.   On: 11/25/2017 09:59   Dg Chest Port 1 View  Result Date: 11/23/2017 CLINICAL DATA:  47 year old male with shortness of breath. Status post ultrasound-guided thoracentesis on 11/18/2017 large left pleural effusion. Negative cytology and cultures of the left pleural fluid. EXAM: PORTABLE CHEST 1 VIEW COMPARISON:  11/21/2017 FINDINGS: Portable AP semi upright view at 0642 hrs. Stable lung volumes. Stable mediastinal contours. Visualized tracheal air column is within normal limits. No pneumothorax. Increased dense left lung base opacity since the post thoracentesis image on 11/18/2017. Stable obscuration of the left hemidiaphragm since 11/21/2017. The right lung remains clear. Paucity of bowel gas in the upper abdomen. IMPRESSION: 1. Increasing opacity at the left lung base since 11/18/2017 could reflect recurrent left pleural effusion and/or atelectasis versus consolidation. 2. Negative right lung. Electronically Signed   By: Genevie Ann M.D.   On: 11/23/2017 07:47   Dg Chest Portable 1 View  Result Date: 11/21/2017 CLINICAL DATA:  Pleural effusion. EXAM: PORTABLE CHEST 1 VIEW COMPARISON:   11/18/2017 FINDINGS: Heart size upper limits of normal. Left mid and lower lung bronchopneumonia remains evident. There are pleural effusions, larger on the left than the right. Right lung appears otherwise clear. IMPRESSION: Persistent pneumonia in the left mid and lower lung. Pleural effusions left larger than right. Electronically Signed   By: Nelson Chimes M.D.   On: 11/21/2017 15:17   US Abdomen Limited Ruq  Result Date: 11/26/2017 CLINICAL DATA:  Elevated LFTs EXAM: ULTRASOUND ABDOMEN LIMITED RIGHT UPPER QUADRANT COMPARISON:  None. FINDINGS: Gallbladder: No gallstones or wall thickening visualized. No sonographic Murphy sign noted by sonographer. Common bile duct: Diameter: 4.3 mm Liver: No focal lesion identified. Within normal limits in parenchymal echogenicity. Portal vein is patent on color Doppler imaging with normal direction of blood flow towards the liver. IMPRESSION: Normal study.  No cause for elevated LFTs identified. Electronically Signed   By: Dorise Bullion III M.D   On: 11/26/2017 20:18   Ir Thoracentesis Asp Pleural Space W/img Guide  Result Date: 11/24/2017 INDICATION: Patient with concern for TB, now with recurrent left pleural effusion. Request is made for diagnostic and therapeutic thoracentesis. EXAM: ULTRASOUND GUIDED DIAGNOSTIC AND THERAPEUTIC THORACENTESIS MEDICATIONS: 10 mL 2% lidocaine COMPLICATIONS: None immediate. PROCEDURE: An ultrasound guided thoracentesis was thoroughly discussed with the patient and questions answered. The benefits, risks, alternatives and complications were also discussed. The patient understands and wishes to proceed with the procedure. Written consent was obtained. Ultrasound was performed to localize and mark an adequate pocket of fluid in the left chest. The area was then prepped and draped in the normal sterile fashion. 2% Lidocaine was used for local anesthesia. Under ultrasound guidance a Safe-T-Centesis catheter was introduced. Thoracentesis  was performed. The catheter was removed and a dressing applied. FINDINGS: A total of approximately 800 mL of amber fluid was removed. Samples were sent to the laboratory as requested by the clinical team. IMPRESSION: Successful ultrasound guided diagnostic and therapeutic left thoracentesis yielding 800 mL of pleural fluid. Read by: Brynda Greathouse PA-C Electronically Signed   By: Sandi Mariscal M.D.   On: 11/24/2017 16:14   Ir Thoracentesis Asp Pleural Space W/img Guide  Result Date: 11/18/2017 INDICATION: Pneumonia with large left pleural effusion. Request is made for diagnostic and therapeutic thoracentesis. EXAM: ULTRASOUND GUIDED DIAGNOSTIC AND THERAPEUTIC THORACENTESIS MEDICATIONS: 2% lidocaine COMPLICATIONS: None immediate.  PROCEDURE: An ultrasound guided thoracentesis was thoroughly discussed with the patient and questions answered. The benefits, risks, alternatives and complications were also discussed. The patient understands and wishes to proceed with the procedure. Written consent was obtained. Ultrasound was performed to localize and mark an adequate pocket of fluid in the left chest. The area was then prepped and draped in the normal sterile fashion. 2% lidocaine was used for local anesthesia. Under ultrasound guidance a Safe-T-Centesis catheter was introduced. Thoracentesis was performed. The catheter was removed and a dressing applied. FINDINGS: A total of approximately 1.4 L of amber fluid was removed. Samples were sent to the laboratory as requested by the clinical team. IMPRESSION: Successful ultrasound guided left thoracentesis yielding 1.4 L of pleural fluid. Read by: Saverio Danker, PA-C Electronically Signed   By: Jacqulynn Cadet M.D.   On: 11/18/2017 14:05   US Thoracentesis Asp Pleural Space W/img Guide  Result Date: 12/02/2017 INDICATION: Patient with left pleural effusion. Request is made for diagnostic and therapeutic thoracentesis. EXAM: ULTRASOUND GUIDED DIAGNOSTIC AND THERAPEUTIC  LEFT THORACENTESIS MEDICATIONS: 10 mL 1% lidocaine COMPLICATIONS: None immediate. PROCEDURE: An ultrasound guided thoracentesis was thoroughly discussed with the patient and questions answered. The benefits, risks, alternatives and complications were also discussed. The patient understands and wishes to proceed with the procedure. Written consent was obtained. Ultrasound was performed to localize and mark an adequate pocket of fluid in the left chest. The area was then prepped and draped in the normal sterile fashion. 1% Lidocaine was used for local anesthesia. Under ultrasound guidance a Safe-T-Centesis catheter was introduced. Thoracentesis was performed. The catheter was removed and a dressing applied. FINDINGS: A total of approximately 600 mL of yellow fluid was removed. Samples were sent to the laboratory as requested by the clinical team. IMPRESSION: Successful ultrasound guided diagnostic and therapeutic left thoracentesis yielding 600 mL of pleural fluid. Read by: Brynda Greathouse PA-C Electronically Signed   By: Aletta Edouard M.D.   On: 12/02/2017 10:35     CBC Recent Labs  Lab 11/27/17 0842 11/29/17 1323 12/02/17 0755 12/03/17 0503  WBC 14.7* 14.5* 11.2* 12.7*  HGB 11.8* 11.2* 10.9* 10.9*  HCT 36.3* 35.1* 33.7* 34.3*  PLT 463* 487* 429* 407*  MCV 77.1* 77.3* 76.9* 77.6*  MCH 25.1* 24.7* 24.9* 24.7*  MCHC 32.5 31.9 32.3 31.8  RDW 13.1 13.7 13.4 13.5    Chemistries  Recent Labs  Lab 11/28/17 0941 12/02/17 0755 12/03/17 0503  NA 136 137 139  K 4.2 4.0 4.1  CL 102 102 104  CO2 24 23 22   GLUCOSE 115* 100* 114*  BUN 18 14 15   CREATININE 1.13 0.97 0.95  CALCIUM 8.6* 8.5* 8.2*   ------------------------------------------------------------------------------------------------------------------ No results for input(s): CHOL, HDL, LDLCALC, TRIG, CHOLHDL, LDLDIRECT in the last 72 hours.  No results found for:  HGBA1C ------------------------------------------------------------------------------------------------------------------ No results for input(s): TSH, T4TOTAL, T3FREE, THYROIDAB in the last 72 hours.  Invalid input(s): FREET3 ------------------------------------------------------------------------------------------------------------------ Recent Labs    12/01/17 0457  FERRITIN 506*    Coagulation profile No results for input(s): INR, PROTIME in the last 168 hours.  No results for input(s): DDIMER in the last 72 hours.  Cardiac Enzymes No results for input(s): CKMB, TROPONINI, MYOGLOBIN in the last 168 hours.  Invalid input(s): CK ------------------------------------------------------------------------------------------------------------------    Component Value Date/Time   BNP 99.3 11/16/2017 [place] M.D on 12/03/2017 at 7:42 PM  Between 7am to 7pm - Pager - [phone]  After 7pm go to [email or web address] -  password [facility] -  Office  [phone]   Voice Recognition Viviann [employer] dictation system was used to create this note, attempts have been made to correct errors. Please contact the author with questions and/or clarifications.   29-year-old male with

## 2017-12-03 NOTE — Progress Notes (Signed)
Received call from Cassandra with the [facility] requesting results of HIV to be faxed to her; lab results faxed as requested. She also stated that if the patient is discharged tomorrow, to please facilitate an early discharge home so that she will see the patient tomorrow. Abelino DerrickB Rashaunda Rahl [facility] Institute Surgery CenterRN,MHA,BSN [phone]

## 2017-12-03 NOTE — Progress Notes (Deleted)
History of Present Illness Scott Rowe is a 47 y.o. male never smoker HTN and seen as an inpatient for large left exudative pleural effusion with negative cultures and cytology. He was seen as an inpatient by Dr. Isaiah Serge.   47 year old with past medical history of hypertension  Admitted on 1/21 with dyspnea, chest pain.  Noted to have large left pleural effusion status post thoracentesis on 1/23 with removal of 1.4 L, repeat thoracentesis on 1/29 with removal of 800 mL.  The etiology of the effusion is unclear.  Labs tests show it is exudative in nature with negative cultures and cytology.  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 Reconsult 2/5 for persistent effusion with chest pain and dyspnea. He has been seen by CVTS and he is not a candidate for VATS surgery or biopsy. Autoimmune workup negative except for mild elevation in rheumatoid factor. Assessment and plan per pulmonary was as follows:   ASSESSMENT / PLAN: Left exudative pleural effusion  Diagnosis is parapneumonic versus tuberculous effusion.  There is no evidence of malignancy and low suspicion for autoimmune process Underwent third thoracentesis today with removal of 600 cc of fluid Overall effusion appears to be improving with decreasing LDH indicating that the inflammation is getting better.  Suspect that the fluid that was removed today was residual fluid left behind after incomplete drainage the previous 2 times.  Hopefully he would not accumulate more effusion.  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 - NSAIDs for pain and antiinflammatory effect. Agree with holding steroids.  - Can get a chest x-ray in a few days as the patient is anxious that the fluid will re accumulate. - He will need follow-up chest x-ray in 2-4 weeks as an outpatient.  AFB Negative Gram Stains >> Negative  EVENTS 1/23 - pleural fluid - exudative with LDH 237 , protien   4.8 and glucose not avail. Mesothelial cells +, 69% lymphs 1/29  - pleural efluid with Gluc 122, LDH 130, and 69% polys and mesothelia cells +. AFB smear negative. 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     12/03/2017 Hospital Follow up:   Test Results:  CBC Latest Ref Rng & Units 12/03/2017 12/02/2017 11/29/2017  WBC 4.0 - 10.5 K/uL 12.7(H) 11.2(H) 14.5(H)  Hemoglobin 13.0 - 17.0 g/dL 10.9(L) 10.9(L) 11.2(L)  Hematocrit 39.0 - 52.0 % 34.3(L) 33.7(L) 35.1(L)  Platelets 150 - 400 K/uL 407(H) 429(H) 487(H)    BMP Latest Ref Rng & Units 12/03/2017 12/02/2017 11/28/2017  Glucose 65 - 99 mg/dL 725(D) 664(Q) 034(V)  BUN 6 - 20 mg/dL 15 14 18   Creatinine 0.61 - 1.24 mg/dL 4.25 9.56 3.87  Sodium 135 - 145 mmol/L 139 137 136  Potassium 3.5 - 5.1 mmol/L 4.1 4.0 4.2  Chloride 101 - 111 mmol/L 104 102 102  CO2 22 - 32 mmol/L 22 23 24   Calcium 8.9 - 10.3 mg/dL 8.2(L) 8.5(L) 8.6(L)    BNP    Component Value Date/Time   BNP 99.3 11/16/2017 1509    ProBNP No results found for: PROBNP  PFT No results found for: FEV1PRE, FEV1POST, FVCPRE, FVCPOST, TLC, DLCOUNC, PREFEV1FVCRT, PSTFEV1FVCRT  Dg Chest 1 View  Result Date: 11/24/2017 CLINICAL DATA:  47 year old male with a history of recent left-sided thoracentesis EXAM: CHEST 1 VIEW COMPARISON:  Chest x-ray 11/23/2017, CT 11/23/2017 FINDINGS: Cardiomediastinal silhouette unchanged.  Improved aeration at the left base. Blunting of left costophrenic angle. No pneumothorax. No confluent airspace disease. IMPRESSION: Improved aeration at the left base with decreased pleural effusion and no evidence of pneumothorax. Electronically Signed   By: Gilmer MorJaime  Wagner D.O.   On: 11/24/2017 14:30   Dg Chest 1 View  Result Date: 11/18/2017 CLINICAL DATA:  Post left-sided thoracentesis. History of hypertension. EXAM: CHEST 1 VIEW COMPARISON:  11/16/2017; chest CT-11/16/2010  FINDINGS: Interval reduction/near resolution of residual trace left-sided effusion post thoracentesis. No pneumothorax. Unchanged trace right-sided pleural effusion. Improved aeration of the left lower lung with persistent left mid and basilar heterogeneous/consolidative opacities. Pulmonary vasculature remains indistinct with cephalization of flow. No acute osseus abnormalities. IMPRESSION: 1. Interval reduction/near resolution of left-sided effusion post thoracentesis. No pneumothorax. 2. Improved aeration of left lower lung with similar findings of suspected pulmonary edema and bibasilar atelectasis. Electronically Signed   By: Simonne ComeJohn  Watts M.D.   On: 11/18/2017 12:58   Dg Chest 2 View  Result Date: 11/30/2017 CLINICAL DATA:  Hypertension, asthma, dyspnea, chest pain, productive cough, BILATERAL pleural effusions post LEFT thoracentesis EXAM: CHEST  2 VIEW COMPARISON:  11/28/2017 FINDINGS: Enlargement of cardiac silhouette. Mediastinal contours and pulmonary vascularity normal. Persistent LEFT pleural effusion and basilar opacity question atelectasis versus consolidation. Tiny RIGHT pleural effusion. Remaining lungs clear. No additional infiltrate or pneumothorax. Bones unremarkable. IMPRESSION: Persistent LEFT pleural effusion with atelectasis versus consolidation at LEFT base. Enlargement of cardiac silhouette with tiny RIGHT pleural effusion. Electronically Signed   By: Ulyses SouthwardMark  Boles M.D.   On: 11/30/2017 13:35   Dg Chest 2 View  Result Date: 11/26/2017 CLINICAL DATA:  Pleural effusions.  Recent left thoracentesis. EXAM: CHEST  2 VIEW COMPARISON:  11/25/2017 FINDINGS: The heart size and mediastinal contours are within normal limits. Small left pleural effusion shows no significant change in size. There is persistent atelectasis or consolidation in the left lower lobe. No evidence of pneumothorax. Right lung is clear. IMPRESSION: No significant change in small left pleural effusion and left lower lobe  atelectasis versus consolidation. Electronically Signed   By: [name]  Stahl M.D.   On: 11/26/2017 09:05   Dg Chest 2 View  Result Date: 11/16/2017 CLINICAL DATA:  Chest pain and shortness of breath for several weeks EXAM: CHEST  2 VIEW COMPARISON:  None. FINDINGS: Cardiac shadow is mildly enlarged. Small right-sided pleural effusion is seen. A large left-sided pleural effusion is noted with likely underlying infiltrate. No pneumothorax is seen. No bony abnormality is noted. IMPRESSION: Large left pleural effusion with likely underlying infiltrate. Minimal blunting of the right costophrenic angle is noted as well. Electronically Signed   By: Alcide CleverMark  Lukens M.D.   On: 11/16/2017 11:55   Ct Chest W Contrast  Result Date: 11/23/2017 CLINICAL DATA:  Pleural effusion follow up EXAM: CT CHEST WITH CONTRAST TECHNIQUE: Multidetector CT imaging of the chest was performed during intravenous contrast administration. CONTRAST:  75 cc ISOVUE-300 IOPAMIDOL (ISOVUE-300) INJECTION 61% COMPARISON:  CT chest dated 11/18/2017. chest CT dated 11/16/2017. FINDINGS: Cardiovascular: No thoracic aortic aneurysm or evidence of aortic dissection. Cardiomegaly. Small pericardial effusion, similar to previous exam. Mediastinum/Nodes: Stable mildly prominent lymph node within the right lower paratracheal space, measuring 12 mm short axis dimension. Additional stable mildly prominent lymph node within the subcarinal space, also measuring approximately 12 mm short axis dimension. Additional scattered smaller lymph nodes within the mediastinum. No new lymphadenopathy. Esophagus appears normal.  Trachea appears normal. Lungs/Pleura: Moderate-sized left pleural effusion, with adjacent compressive atelectasis, increased  compared to the chest CT of 11/18/2017, less than the pleural effusion that was seen on 11/16/2017. Small right pleural effusion with adjacent compressive atelectasis. Lungs otherwise clear.  No pneumothorax. Upper Abdomen: No  acute findings within the upper abdomen. Musculoskeletal: No acute or suspicious osseous finding. IMPRESSION: 1. Moderate-sized left pleural effusion, with adjacent compressive atelectasis. The pleural effusion has reaccumulated since chest CT of 11/18/2017 but is not as large as the pleural effusion seen on earlier chest CT of 11/16/2017. 2. Small right pleural effusion, similar to previous exams. 3. Small pericardial effusion, similar to previous exam. Stable cardiomegaly. 4. Stable mild lymphadenopathy within the mediastinum, as detailed above. Electronically Signed   By: Bary Richard M.D.   On: 11/23/2017 23:38   Ct Chest W Contrast  Result Date: 11/16/2017 CLINICAL DATA:  RIGHT upper quadrant RIGHT-side chest pain with shortness of breath for 2 weeks, severe chest pain when lying flat, unable to raise RIGHT arm above head, shortness of breath for 2 weeks, history hypertension EXAM: CT CHEST, ABDOMEN, AND PELVIS WITH CONTRAST TECHNIQUE: Multidetector CT imaging of the chest, abdomen and pelvis was performed following the standard protocol during bolus administration of intravenous contrast. Sagittal and coronal MPR images reconstructed from axial data set. Patient was unable to raise his RIGHT arm above his head, resulting in beam hardening artifacts at the chest and abdomen. CONTRAST:  ISOVUE-300 IOPAMIDOL (ISOVUE-300) INJECTION 61% IV. No oral contrast administered. COMPARISON:  None FINDINGS: CT CHEST FINDINGS Cardiovascular: Thoracic vascular structures grossly patent on nondedicated exam. Small pericardial effusion. Mediastinum/Nodes: Normal sized RIGHT cardiophrenic angle lymph nodes. Enlarged azygo-esophageal recess node 16 mm short axis image 32. Minimally enlarged RIGHT paratracheal node 11 mm short axis image 26. No definite esophageal abnormalities. Base of cervical region unremarkable. Lungs/Pleura: Large LEFT pleural effusion with subtotal atelectasis of LEFT lower lobe and partial  atelectasis of LEFT upper lobe in lingula. Small RIGHT pleural effusion with minimal compressive atelectasis of adjacent posterior RIGHT lower lobe base. Scattered respiratory motion artifacts. No definite infiltrate or pneumothorax. Musculoskeletal: No acute osseous abnormalities. CT ABDOMEN PELVIS FINDINGS Hepatobiliary: Gallbladder and liver normal appearance Pancreas: Normal appearance Spleen: Normal appearance Adrenals/Urinary Tract: Adrenal glands, kidneys, ureters, and bladder normal appearance Stomach/Bowel: Normal appearing retrocecal appendix. Distal stomach incompletely distended. Stomach and bowel loops otherwise normal appearance for technique Vascular/Lymphatic: Vascular structures normal appearance. No adenopathy. Reproductive: Minimal prostatic enlargement. Other: No free air, free fluid, hernia, or inflammatory process Musculoskeletal: No acute osseous abnormalities. IMPRESSION: Large LEFT pleural effusion with significant atelectasis of the LEFT lung. Small RIGHT pleural effusion with minimal atelectasis of the adjacent RIGHT lower lobe. Nonspecific mildly enlarged mediastinal lymph nodes. No acute intra-abdominal or intrapelvic abnormalities. No acute osseous abnormalities. Electronically Signed   By: Ulyses Southward M.D.   On: 11/16/2017 13:53   Ct Angio Chest Pe W Or Wo Contrast  Result Date: 11/18/2017 CLINICAL DATA:  Shortness of breath.  Recent thoracentesis. EXAM: CT ANGIOGRAPHY CHEST WITH CONTRAST TECHNIQUE: Multidetector CT imaging of the chest was performed using the standard protocol during bolus administration of intravenous contrast. Multiplanar CT image reconstructions and MIPs were obtained to evaluate the vascular anatomy. CONTRAST:  50mL ISOVUE-370 IOPAMIDOL (ISOVUE-370) INJECTION 76% COMPARISON:  Chest radiograph 11/18/2017 FINDINGS: Cardiovascular: Contrast injection is sufficient to demonstrate satisfactory opacification of the pulmonary arteries to the segmental level. There is  no pulmonary embolus. The main pulmonary artery is within normal limits for size. There is a normal 3-vessel arch branching pattern without evidence of  acute aortic syndrome. There is noaortic atherosclerosis. Heart size is enlarged. Small pericardial effusion is unchanged. Mediastinum/Nodes: No mediastinal, hilar or axillary lymphadenopathy. The visualized thyroid and thoracic esophageal course are unremarkable. Lungs/Pleura: Ground-glass opacities throughout the lingula and left lower lobe with markedly decreased size of pleural effusion, which is now small. Unchanged medium-sized right pleural effusion. Mild right basilar atelectasis. No pneumothorax. Upper Abdomen: Contrast bolus timing is not optimized for evaluation of the abdominal organs. Within this limitation, the visualized organs of the upper abdomen are normal. Musculoskeletal: No chest wall abnormality. No acute or significant osseous findings. Review of the MIP images confirms the above findings. IMPRESSION: 1. No pulmonary embolus. 2. Greatly decreased size of left pleural effusion following thoracentesis with multifocal ground-glass opacity throughout the lingula and left lower lobe likely indicating re-expansion pulmonary edema. 3. No pneumothorax. 4. Small pericardial effusion, unchanged. Electronically Signed   By: Deatra Robinson M.D.   On: 11/18/2017 18:48   Ct Abdomen Pelvis W Contrast  Result Date: 11/16/2017 CLINICAL DATA:  RIGHT upper quadrant RIGHT-side chest pain with shortness of breath for 2 weeks, severe chest pain when lying flat, unable to raise RIGHT arm above head, shortness of breath for 2 weeks, history hypertension EXAM: CT CHEST, ABDOMEN, AND PELVIS WITH CONTRAST TECHNIQUE: Multidetector CT imaging of the chest, abdomen and pelvis was performed following the standard protocol during bolus administration of intravenous contrast. Sagittal and coronal MPR images reconstructed from axial data set. Patient was unable to raise his  RIGHT arm above his head, resulting in beam hardening artifacts at the chest and abdomen. CONTRAST:  ISOVUE-300 IOPAMIDOL (ISOVUE-300) INJECTION 61% IV. No oral contrast administered. COMPARISON:  None FINDINGS: CT CHEST FINDINGS Cardiovascular: Thoracic vascular structures grossly patent on nondedicated exam. Small pericardial effusion. Mediastinum/Nodes: Normal sized RIGHT cardiophrenic angle lymph nodes. Enlarged azygo-esophageal recess node 16 mm short axis image 32. Minimally enlarged RIGHT paratracheal node 11 mm short axis image 26. No definite esophageal abnormalities. Base of cervical region unremarkable. Lungs/Pleura: Large LEFT pleural effusion with subtotal atelectasis of LEFT lower lobe and partial atelectasis of LEFT upper lobe in lingula. Small RIGHT pleural effusion with minimal compressive atelectasis of adjacent posterior RIGHT lower lobe base. Scattered respiratory motion artifacts. No definite infiltrate or pneumothorax. Musculoskeletal: No acute osseous abnormalities. CT ABDOMEN PELVIS FINDINGS Hepatobiliary: Gallbladder and liver normal appearance Pancreas: Normal appearance Spleen: Normal appearance Adrenals/Urinary Tract: Adrenal glands, kidneys, ureters, and bladder normal appearance Stomach/Bowel: Normal appearing retrocecal appendix. Distal stomach incompletely distended. Stomach and bowel loops otherwise normal appearance for technique Vascular/Lymphatic: Vascular structures normal appearance. No adenopathy. Reproductive: Minimal prostatic enlargement. Other: No free air, free fluid, hernia, or inflammatory process Musculoskeletal: No acute osseous abnormalities. IMPRESSION: Large LEFT pleural effusion with significant atelectasis of the LEFT lung. Small RIGHT pleural effusion with minimal atelectasis of the adjacent RIGHT lower lobe. Nonspecific mildly enlarged mediastinal lymph nodes. No acute intra-abdominal or intrapelvic abnormalities. No acute osseous abnormalities.  Electronically Signed   By: Ulyses Southward M.D.   On: 11/16/2017 13:53   Dg Chest Port 1 View  Result Date: 12/02/2017 CLINICAL DATA:  Post left thoracentesis EXAM: PORTABLE CHEST 1 VIEW COMPARISON:  11/30/2017 FINDINGS: Cardiomegaly with vascular congestion. Decreasing left effusion following thoracentesis. No pneumothorax. Continued left lower lobe airspace opacity. Right lung clear. IMPRESSION: No pneumothorax following left thoracentesis. Left basilar atelectasis or infiltrate. Electronically Signed   By: Charlett Nose M.D.   On: 12/02/2017 10:23   Dg Chest Port 1 View  Result Date:  11/28/2017 CLINICAL DATA:  Dyspnea. EXAM: PORTABLE CHEST 1 VIEW COMPARISON:  11/26/2017 FINDINGS: Cardiomediastinal silhouette is enlarged. There is persistent left lower lobe airspace consolidation with moderate in size left pleural effusion. Small right subpulmonic pleural effusion cannot be excluded. No evidence of pneumothorax. Osseous structures are without acute abnormality. Soft tissues are grossly normal. IMPRESSION: Persistent left lower lobe airspace consolidation and moderate in size left pleural effusion. Probable small subpulmonic right pleural effusion. Enlarged cardiac silhouette. Electronically Signed   By: Ted Mcalpine M.D.   On: 11/28/2017 09:35   Dg Chest Port 1 View  Result Date: 11/25/2017 CLINICAL DATA:  Left-sided thoracentesis yesterday EXAM: PORTABLE CHEST 1 VIEW COMPARISON:  11/24/2017 FINDINGS: Slight interval increase in left base atelectasis with probable small left effusion. Right lung remains clear. No evidence for pneumothorax The cardio pericardial silhouette is enlarged. The visualized bony structures of the thorax are intact. Telemetry leads overlie the chest. IMPRESSION: Slight increase in left base atelectasis/effusion. Electronically Signed   By: Kennith Center M.D.   On: 11/25/2017 09:59   Dg Chest Port 1 View  Result Date: 11/23/2017 CLINICAL DATA:  47 year old male with  shortness of breath. Status post ultrasound-guided thoracentesis on 11/18/2017 large left pleural effusion. Negative cytology and cultures of the left pleural fluid. EXAM: PORTABLE CHEST 1 VIEW COMPARISON:  11/21/2017 FINDINGS: Portable AP semi upright view at 0642 hrs. Stable lung volumes. Stable mediastinal contours. Visualized tracheal air column is within normal limits. No pneumothorax. Increased dense left lung base opacity since the post thoracentesis image on 11/18/2017. Stable obscuration of the left hemidiaphragm since 11/21/2017. The right lung remains clear. Paucity of bowel gas in the upper abdomen. IMPRESSION: 1. Increasing opacity at the left lung base since 11/18/2017 could reflect recurrent left pleural effusion and/or atelectasis versus consolidation. 2. Negative right lung. Electronically Signed   By: Odessa Fleming M.D.   On: 11/23/2017 07:47   Dg Chest Portable 1 View  Result Date: 11/21/2017 CLINICAL DATA:  Pleural effusion. EXAM: PORTABLE CHEST 1 VIEW COMPARISON:  11/18/2017 FINDINGS: Heart size upper limits of normal. Left mid and lower lung bronchopneumonia remains evident. There are pleural effusions, larger on the left than the right. Right lung appears otherwise clear. IMPRESSION: Persistent pneumonia in the left mid and lower lung. Pleural effusions left larger than right. Electronically Signed   By: Paulina Fusi M.D.   On: 11/21/2017 15:17   US Abdomen Limited Ruq  Result Date: 11/26/2017 CLINICAL DATA:  Elevated LFTs EXAM: ULTRASOUND ABDOMEN LIMITED RIGHT UPPER QUADRANT COMPARISON:  None. FINDINGS: Gallbladder: No gallstones or wall thickening visualized. No sonographic Murphy sign noted by sonographer. Common bile duct: Diameter: 4.3 mm Liver: No focal lesion identified. Within normal limits in parenchymal echogenicity. Portal vein is patent on color Doppler imaging with normal direction of blood flow towards the liver. IMPRESSION: Normal study.  No cause for elevated LFTs identified.  Electronically Signed   By: Gerome Sam III M.D   On: 11/26/2017 20:18   Ir Thoracentesis Asp Pleural Space W/img Guide  Result Date: 11/24/2017 INDICATION: Patient with concern for TB, now with recurrent left pleural effusion. Request is made for diagnostic and therapeutic thoracentesis. EXAM: ULTRASOUND GUIDED DIAGNOSTIC AND THERAPEUTIC THORACENTESIS MEDICATIONS: 10 mL 2% lidocaine COMPLICATIONS: None immediate. PROCEDURE: An ultrasound guided thoracentesis was thoroughly discussed with the patient and questions answered. The benefits, risks, alternatives and complications were also discussed. The patient understands and wishes to proceed with the procedure. Written consent was obtained. Ultrasound was performed to  localize and mark an adequate pocket of fluid in the left chest. The area was then prepped and draped in the normal sterile fashion. 2% Lidocaine was used for local anesthesia. Under ultrasound guidance a Safe-T-Centesis catheter was introduced. Thoracentesis was performed. The catheter was removed and a dressing applied. FINDINGS: A total of approximately 800 mL of amber fluid was removed. Samples were sent to the laboratory as requested by the clinical team. IMPRESSION: Successful ultrasound guided diagnostic and therapeutic left thoracentesis yielding 800 mL of pleural fluid. Read by: Loyce Dys PA-C Electronically Signed   By: Simonne Come M.D.   On: 11/24/2017 16:14   Ir Thoracentesis Asp Pleural Space W/img Guide  Result Date: 11/18/2017 INDICATION: Pneumonia with large left pleural effusion. Request is made for diagnostic and therapeutic thoracentesis. EXAM: ULTRASOUND GUIDED DIAGNOSTIC AND THERAPEUTIC THORACENTESIS MEDICATIONS: 2% lidocaine COMPLICATIONS: None immediate. PROCEDURE: An ultrasound guided thoracentesis was thoroughly discussed with the patient and questions answered. The benefits, risks, alternatives and complications were also discussed. The patient understands and  wishes to proceed with the procedure. Written consent was obtained. Ultrasound was performed to localize and mark an adequate pocket of fluid in the left chest. The area was then prepped and draped in the normal sterile fashion. 2% lidocaine was used for local anesthesia. Under ultrasound guidance a Safe-T-Centesis catheter was introduced. Thoracentesis was performed. The catheter was removed and a dressing applied. FINDINGS: A total of approximately 1.4 L of amber fluid was removed. Samples were sent to the laboratory as requested by the clinical team. IMPRESSION: Successful ultrasound guided left thoracentesis yielding 1.4 L of pleural fluid. Read by: Barnetta Chapel, PA-C Electronically Signed   By: Malachy Moan M.D.   On: 11/18/2017 14:05   US Thoracentesis Asp Pleural Space W/img Guide  Result Date: 12/02/2017 INDICATION: Patient with left pleural effusion. Request is made for diagnostic and therapeutic thoracentesis. EXAM: ULTRASOUND GUIDED DIAGNOSTIC AND THERAPEUTIC LEFT THORACENTESIS MEDICATIONS: 10 mL 1% lidocaine COMPLICATIONS: None immediate. PROCEDURE: An ultrasound guided thoracentesis was thoroughly discussed with the patient and questions answered. The benefits, risks, alternatives and complications were also discussed. The patient understands and wishes to proceed with the procedure. Written consent was obtained. Ultrasound was performed to localize and mark an adequate pocket of fluid in the left chest. The area was then prepped and draped in the normal sterile fashion. 1% Lidocaine was used for local anesthesia. Under ultrasound guidance a Safe-T-Centesis catheter was introduced. Thoracentesis was performed. The catheter was removed and a dressing applied. FINDINGS: A total of approximately 600 mL of yellow fluid was removed. Samples were sent to the laboratory as requested by the clinical team. IMPRESSION: Successful ultrasound guided diagnostic and therapeutic left thoracentesis yielding  600 mL of pleural fluid. Read by: Loyce Dys PA-C Electronically Signed   By: Irish Lack M.D.   On: 12/02/2017 10:35     Past medical hx Past Medical History:  Diagnosis Date  . Hypertension      Social History   Tobacco Use  . Smoking status: Never Smoker  . Smokeless tobacco: Never Used  Substance Use Topics  . Alcohol use: No    Frequency: Never  . Drug use: No    Mr.Baus reports that  has never smoked. he has never used smokeless tobacco. He reports that he does not drink alcohol or use drugs.  Tobacco Cessation: Counseling given: Not Answered   Past surgical hx, Family hx, Social hx all reviewed.  Current Facility-Administered Medications on File Prior to  Visit  Medication  . 0.9 %  sodium chloride infusion  . acetaminophen (TYLENOL) tablet 650 mg   Or  . acetaminophen (TYLENOL) suppository 650 mg  . aspirin EC tablet 81 mg  . diltiazem (CARDIZEM CD) 24 hr capsule 120 mg  . enoxaparin (LOVENOX) injection 40 mg  . ethambutol (MYAMBUTOL) tablet 1,600 mg  . hydrALAZINE (APRESOLINE) injection 10 mg  . ibuprofen (ADVIL,MOTRIN) tablet 600 mg  . isoniazid (NYDRAZID) tablet 300 mg  . lidocaine (XYLOCAINE) 1 % (with pres) injection  . menthol-cetylpyridinium (CEPACOL) lozenge 3 mg  . metoprolol tartrate (LOPRESSOR) tablet 100 mg  . morphine 2 MG/ML injection 1 mg  . ondansetron (ZOFRAN) tablet 4 mg   Or  . ondansetron (ZOFRAN) injection 4 mg  . oxyCODONE (Oxy IR/ROXICODONE) immediate release tablet 5 mg  . pyrazinamide tablet 2,000 mg  . pyridOXINE (VITAMIN B-6) tablet 50 mg  . rifampin (RIFADIN) capsule 600 mg  . sodium chloride flush (NS) 0.9 % injection 3 mL  . sodium chloride flush (NS) 0.9 % injection 3 mL   Current Outpatient Medications on File Prior to Visit  Medication Sig  . acetaminophen (TYLENOL) 500 MG tablet Take 500 mg by mouth every 6 (six) hours as needed for mild pain.  . Aspirin-Acetaminophen-Caffeine (GOODY HEADACHE PO) Take 1  packet by mouth every 6 (six) hours as needed (pain).  . vitamin C (ASCORBIC ACID) 500 MG tablet Take 500 mg by mouth daily.     No Known Allergies  Review Of Systems:  Constitutional:   No  weight loss, night sweats,  Fevers, chills, fatigue, or  lassitude.  HEENT:   No headaches,  Difficulty swallowing,  Tooth/dental problems, or  Sore throat,                No sneezing, itching, ear ache, nasal congestion, post nasal drip,   CV:  No chest pain,  Orthopnea, PND, swelling in lower extremities, anasarca, dizziness, palpitations, syncope.   GI  No heartburn, indigestion, abdominal pain, nausea, vomiting, diarrhea, change in bowel habits, loss of appetite, bloody stools.   Resp: No shortness of breath with exertion or at rest.  No excess mucus, no productive cough,  No non-productive cough,  No coughing up of blood.  No change in color of mucus.  No wheezing.  No chest wall deformity  Skin: no rash or lesions.  GU: no dysuria, change in color of urine, no urgency or frequency.  No flank pain, no hematuria   MS:  No joint pain or swelling.  No decreased range of motion.  No back pain.  Psych:  No change in mood or affect. No depression or anxiety.  No memory loss.   Vital Signs There were no vitals taken for this visit.   Physical Exam:  General- No distress,  A&Ox3 ENT: No sinus tenderness, TM clear, pale nasal mucosa, no oral exudate,no post nasal drip, no LAN Cardiac: S1, S2, regular rate and rhythm, no murmur Chest: No wheeze/ rales/ dullness; no accessory muscle use, no nasal flaring, no sternal retractions Abd.: Soft Non-tender Ext: No clubbing cyanosis, edema Neuro:  normal strength Skin: No rashes, warm and dry Psych: normal mood and behavior   Assessment/Plan  No problem-specific Assessment & Plan notes found for this encounter.    Bevelyn Ngo, NP 12/03/2017  9:13 AM

## 2017-12-03 NOTE — Progress Notes (Signed)
Subjective: No new complaints Denying SOB but is having some slight chest pain with deep inspiration  Antibiotics:  Anti-infectives (From admission, onward)   Start     Dose/Rate Route Frequency Ordered Stop   11/29/17 1300  cefTRIAXone (ROCEPHIN) 1 g in dextrose 5 % 50 mL IVPB  Status:  Discontinued     1 g 100 mL/hr over 30 Minutes Intravenous Every 24 hours 11/29/17 1259 11/30/17 1139   11/27/17 1400  isoniazid (NYDRAZID) tablet 300 mg     300 mg Oral Daily 11/27/17 1302     11/27/17 1400  rifampin (RIFADIN) capsule 600 mg     600 mg Oral Daily 11/27/17 1302     11/27/17 1400  pyrazinamide tablet 2,000 mg     2,000 mg Oral Daily 11/27/17 1302     11/27/17 1400  ethambutol (MYAMBUTOL) tablet 1,600 mg     1,600 mg Oral Daily 11/27/17 1302     11/25/17 1400  azithromycin (ZITHROMAX) tablet 500 mg  Status:  Discontinued     500 mg Oral Daily 11/25/17 1314 11/27/17 1302   11/23/17 1200  cefTRIAXone (ROCEPHIN) 1 g in dextrose 5 % 50 mL IVPB  Status:  Discontinued     1 g 100 mL/hr over 30 Minutes Intravenous Daily 11/23/17 1113 11/27/17 1302   11/23/17 1115  azithromycin (ZITHROMAX) 500 mg in dextrose 5 % 250 mL IVPB  Status:  Discontinued     500 mg 250 mL/hr over 60 Minutes Intravenous Every 24 hours 11/23/17 1042 11/25/17 1313   11/18/17 1500  levofloxacin (LEVAQUIN) tablet 500 mg  Status:  Discontinued     500 mg Oral Daily 11/17/17 1550 11/22/17 1406   11/16/17 1530  levofloxacin (LEVAQUIN) IVPB 750 mg  Status:  Discontinued     750 mg 100 mL/hr over 90 Minutes Intravenous Every 24 hours 11/16/17 1520 11/17/17 1550   11/16/17 1400  levofloxacin (LEVAQUIN) IVPB 750 mg     750 mg 100 mL/hr over 90 Minutes Intravenous  Once 11/16/17 1357 11/16/17 1616     Medications: Scheduled Meds: . aspirin EC  81 mg Oral Daily  . diltiazem  120 mg Oral BID  . enoxaparin (LOVENOX) injection  40 mg Subcutaneous Q24H  . ethambutol  1,600 mg Oral Daily  . ibuprofen  600 mg Oral  TID  . isoniazid  300 mg Oral Daily  . metoprolol tartrate  100 mg Oral BID  . pyrazinamide  2,000 mg Oral Daily  . vitamin B-6  50 mg Oral Daily  . rifampin  600 mg Oral Daily  . sodium chloride flush  3 mL Intravenous Q12H   Continuous Infusions: . sodium chloride     PRN Meds:.sodium chloride, acetaminophen **OR** acetaminophen, hydrALAZINE, lidocaine, menthol-cetylpyridinium, morphine injection, ondansetron **OR** ondansetron (ZOFRAN) IV, oxyCODONE, sodium chloride flush  Objective: Weight change: 6 oz (0.169 kg)  Intake/Output Summary (Last 24 hours) at 12/03/2017 0838 Last data filed at 12/03/2017 0513 Gross per 24 hour  Intake 719 ml  Output 850 ml  Net -131 ml   Blood pressure 128/88, pulse 76, temperature 98.9 F (37.2 C), temperature source Oral, resp. rate 18, height 5\' 8"  (1.727 m), weight 195 lb 11.2 oz (88.8 kg), SpO2 99 %. Temp:  [97.8 F (36.6 C)-98.9 F (37.2 C)] 98.9 F (37.2 C) (02/07 0513) Pulse Rate:  [76-97] 76 (02/07 0513) Resp:  [18-20] 18 (02/07 0513) BP: (100-147)/(63-98) 128/88 (02/07 0513) SpO2:  [98 %-99 %] 99 % (  02/07 0513) Weight:  [195 lb 11.2 oz (88.8 kg)] 195 lb 11.2 oz (88.8 kg) (02/07 0513)  Physical Exam: General: Alert and awake, oriented x3, not in any acute distress. HEENT: Anicteric sclera, pupils reactive to light and accommodation, EOMI CVS: Regular rate, normal r, no murmur rubs or gallops Chest: Improved air movement on the right, clear breath sounds on the left Abdomen: Soft nontender, nondistended, normal bowel sounds, Extremities: No clubbing or edema noted bilaterally Skin: No rashes Lymph: No new lymphadenopathy Neuro: Nonfocal  CBC: @LABBLAST3 (wbc3,Hgb:3,Hct:3,Plt:3,INR:3APTT:3)@  BMET Recent Labs    12/02/17 0755 12/03/17 0503  NA 137 139  K 4.0 4.1  CL 102 104  CO2 23 22  GLUCOSE 100* 114*  BUN 14 15  CREATININE 0.97 0.95  CALCIUM 8.5* 8.2*   Liver Panel  No results for input(s): PROT, ALBUMIN, AST, ALT,  ALKPHOS, BILITOT, BILIDIR, IBILI in the last 72 hours.  Sedimentation Rate No results for input(s): ESRSEDRATE in the last 72 hours. C-Reactive Protein No results for input(s): CRP in the last 72 hours.  Micro Results: Recent Results (from the past 720 hour(s))  Culture, blood (routine x 2)     Status: None   Collection Time: 11/16/17  1:25 PM  Result Value Ref Range Status   Specimen Description BLOOD RIGHT ANTECUBITAL  Final   Special Requests   Final    BOTTLES DRAWN AEROBIC AND ANAEROBIC Blood Culture adequate volume   Culture NO GROWTH 5 DAYS  Final   Report Status 11/21/2017 FINAL  Final  Culture, blood (routine x 2)     Status: None   Collection Time: 11/16/17  3:09 PM  Result Value Ref Range Status   Specimen Description BLOOD LEFT ANTECUBITAL  Final   Special Requests   Final    BOTTLES DRAWN AEROBIC AND ANAEROBIC Blood Culture adequate volume   Culture NO GROWTH 5 DAYS  Final   Report Status 11/21/2017 FINAL  Final  Gram stain     Status: None   Collection Time: 11/18/17 12:48 PM  Result Value Ref Range Status   Specimen Description PLEURAL LEFT  Final   Special Requests NONE  Final   Gram Stain   Final    FEW WBC PRESENT, PREDOMINANTLY MONONUCLEAR NO ORGANISMS SEEN    Report Status 11/18/2017 FINAL  Final  Culture, body fluid-bottle     Status: None   Collection Time: 11/18/17 12:48 PM  Result Value Ref Range Status   Specimen Description PLEURAL LEFT  Final   Special Requests NONE  Final   Culture NO GROWTH 5 DAYS  Final   Report Status 11/23/2017 FINAL  Final  Culture, expectorated sputum-assessment     Status: None   Collection Time: 11/23/17 10:40 AM  Result Value Ref Range Status   Specimen Description SPUTUM  Final   Special Requests Normal  Final   Sputum evaluation   Final    Sputum specimen not acceptable for testing.  Please recollect.   RESULT CALLED TO, READ BACK BY AND VERIFIED WITH: Sheffield SliderL WORLEY RN 11/24/17 0446 JDW    Report Status 11/25/2017  FINAL  Final  Acid Fast Smear (AFB)     Status: None   Collection Time: 11/23/17 10:40 AM  Result Value Ref Range Status   AFB Specimen Processing Concentration  Final   Acid Fast Smear Negative  Final    Comment: (NOTE) Performed At: [facility] [address] [place], [place] [id number] Jolene SchimkeNagendra Sanjai MD OZ:[phone]    Source (  AFB) SPUTUM  Final  Gram stain     Status: None   Collection Time: 11/24/17 12:29 AM  Result Value Ref Range Status   Specimen Description FLUID PLEURAL LEFT  Final   Special Requests NONE  Final   Gram Stain   Final    FEW WBC PRESENT, PREDOMINANTLY PMN NO ORGANISMS SEEN    Report Status 11/25/2017 FINAL  Final  Acid Fast Smear (AFB)     Status: None   Collection Time: 11/24/17 12:29 AM  Result Value Ref Range Status   AFB Specimen Processing Concentration  Final   Acid Fast Smear Negative  Final    Comment: (NOTE) Performed At: [facility] [address] [place], [place] [id number] Jolene Schimke MD WG:[id number]    Source (AFB) FLUID  Final    Comment: PLEURAL LEFT   Acid Fast Smear (AFB)     Status: None   Collection Time: 11/24/17  6:42 AM  Result Value Ref Range Status   AFB Specimen Processing Concentration  Final   Acid Fast Smear Negative  Final    Comment: (NOTE) Performed At: [facility] [address] [place], [place] [id number] Jolene Schimke MD [id number]    Source (AFB) EXPECTORATED SPUTUM  Final  Culture, blood (routine x 2)     Status: None (Preliminary result)   Collection Time: 11/29/17  1:24 PM  Result Value Ref Range Status   Specimen Description BLOOD LEFT FOREARM  Final   Special Requests IN PEDIATRIC BOTTLE Blood Culture adequate volume  Final   Culture   Final    NO GROWTH 3 DAYS Performed at [facility], [address] [place], [place] [address]    Report Status PENDING  Incomplete  Culture, blood (routine x 2)     Status: None (Preliminary result)   Collection Time:  11/29/17  1:27 PM  Result Value Ref Range Status   Specimen Description BLOOD LEFT HAND  Final   Special Requests IN PEDIATRIC BOTTLE Blood Culture adequate volume  Final   Culture   Final    NO GROWTH 3 DAYS Performed at [facility], [address] [place], [place] [address]    Report Status PENDING  Incomplete  Gram stain     Status: None   Collection Time: 12/02/17 10:05 AM  Result Value Ref Range Status   Specimen Description FLUID PLEURAL LEFT  Final   Special Requests NONE  Final   Gram Stain   Final    RARE WBC PRESENT, PREDOMINANTLY PMN NO ORGANISMS SEEN Performed at [facility], [address], [place], [place] [address]    Report Status 12/02/2017 FINAL  Final  Acid Fast Smear (AFB)     Status: None   Collection Time: 12/02/17 10:05 AM  Result Value Ref Range Status   AFB Specimen Processing Concentration  Final   Acid Fast Smear Negative  Final    Comment: (NOTE) Performed At: [facility] [address] [place], [place] [id number] Jolene Schimke MD [id number]    Source (AFB) FLUID  Final    Comment: PLEURAL LEFT    Studies/Results: Dg Chest Port 1 View  Result Date: 12/02/2017 CLINICAL DATA:  Post left thoracentesis EXAM: PORTABLE CHEST 1 VIEW COMPARISON:  11/30/2017 FINDINGS: Cardiomegaly with vascular congestion. Decreasing left effusion following thoracentesis. No pneumothorax. Continued left lower lobe airspace opacity. Right lung clear. IMPRESSION: No pneumothorax following left thoracentesis. Left basilar atelectasis or infiltrate. Electronically Signed   By: Caryn Bee  Dover M.D.   On: 12/02/2017 10:23   US Thoracentesis Asp Pleural Space W/img Guide  Result Date: 12/02/2017 INDICATION: Patient with left pleural effusion. Request is made for diagnostic and therapeutic thoracentesis. EXAM: ULTRASOUND GUIDED DIAGNOSTIC AND THERAPEUTIC LEFT THORACENTESIS MEDICATIONS: 10 mL 1% lidocaine COMPLICATIONS: None immediate. PROCEDURE: An  ultrasound guided thoracentesis was thoroughly discussed with the patient and questions answered. The benefits, risks, alternatives and complications were also discussed. The patient understands and wishes to proceed with the procedure. Written consent was obtained. Ultrasound was performed to localize and mark an adequate pocket of fluid in the left chest. The area was then prepped and draped in the normal sterile fashion. 1% Lidocaine was used for local anesthesia. Under ultrasound guidance a Safe-T-Centesis catheter was introduced. Thoracentesis was performed. The catheter was removed and a dressing applied. FINDINGS: A total of approximately 600 mL of yellow fluid was removed. Samples were sent to the laboratory as requested by the clinical team. IMPRESSION: Successful ultrasound guided diagnostic and therapeutic left thoracentesis yielding 600 mL of pleural fluid. Read by: Loyce Dys PA-C Electronically Signed   By: Irish Lack M.D.   On: 12/02/2017 10:35   Assessment/Plan:  INTERVAL HISTORY:  - Thoracentesis yesterday with removed, fluid is again exudative. MTB PCR sent. Gram stain and AFB negative  - Afebrile and hemodynamically stable   Principal Problem:   PNA (pneumonia) Active Problems:   Respiratory failure (HCC)   Hypertension   Pleural effusion   S/P thoracentesis   Abnormal transaminases   Extrapulmonary tuberculosis  Scott Rowe a 46 y.o.maleoriginally from [place] who presented with 3 weeks of progressive dyspnea, cough, and pleuritic chest pain. CT chest on admission illustrated a L>R pleural effusion with compressive atelectasis vs infiltration with nonspecific enlarged lymph nodes.Pleural fluid analysis x3 indicating an exudative process with unclear etiology at this point.   - Continue RIPE  - Contact health department prior to discharge to ensure patient gets his TB medications [facility], [phone]) - Follow-up MTB PCR and pleural cultures  -  Follow-up with ID clinic in 7-14 days   LOS: 17 days   [facility] 12/03/2017, 8:38 AM

## 2017-12-04 ENCOUNTER — Inpatient Hospital Stay (HOSPITAL_COMMUNITY): Payer: Self-pay

## 2017-12-04 DIAGNOSIS — R079 Chest pain, unspecified: Secondary | ICD-10-CM

## 2017-12-04 DIAGNOSIS — R748 Abnormal levels of other serum enzymes: Secondary | ICD-10-CM

## 2017-12-04 LAB — CULTURE, BLOOD (ROUTINE X 2)
Culture: NO GROWTH
Culture: NO GROWTH
SPECIAL REQUESTS: ADEQUATE
Special Requests: ADEQUATE

## 2017-12-04 MED ORDER — PYRIDOXINE HCL 50 MG PO TABS: 50.0000 mg | ORAL_TABLET | Freq: Every day | ORAL | 3 refills | Status: AC

## 2017-12-04 MED ORDER — ETHAMBUTOL HCL 400 MG PO TABS: 1600.0000 mg | ORAL_TABLET | Freq: Every day | ORAL | 3 refills | Status: AC

## 2017-12-04 MED ORDER — ASPIRIN 81 MG PO TBEC: 81.0000 mg | DELAYED_RELEASE_TABLET | Freq: Every day | ORAL | 3 refills | Status: AC

## 2017-12-04 MED ORDER — RIFAMPIN 300 MG PO CAPS: 600.0000 mg | ORAL_CAPSULE | Freq: Every day | ORAL | 3 refills | Status: AC

## 2017-12-04 MED ORDER — OXYCODONE HCL 5 MG PO TABS: 5.0000 mg | ORAL_TABLET | Freq: Four times a day (QID) | ORAL | 0 refills | Status: DC | PRN

## 2017-12-04 MED ORDER — PYRAZINAMIDE 500 MG PO TABS: 2000.0000 mg | ORAL_TABLET | Freq: Every day | ORAL | 3 refills | Status: AC

## 2017-12-04 MED ORDER — ISONIAZID 300 MG PO TABS: 300.0000 mg | ORAL_TABLET | Freq: Every day | ORAL | 3 refills | Status: AC

## 2017-12-04 MED ORDER — IBUPROFEN 200 MG PO TABS: 400.0000 mg | ORAL_TABLET | Freq: Four times a day (QID) | ORAL | 2 refills | Status: AC | PRN

## 2017-12-04 MED ORDER — DILTIAZEM HCL ER COATED BEADS 120 MG PO CP24: 120.0000 mg | ORAL_CAPSULE | Freq: Every day | ORAL | 3 refills | Status: AC

## 2017-12-04 MED ORDER — METOPROLOL TARTRATE 100 MG PO TABS: 100.0000 mg | ORAL_TABLET | Freq: Two times a day (BID) | ORAL | 3 refills | Status: AC

## 2017-12-04 MED ORDER — VITAMIN C 500 MG PO TABS: 500.0000 mg | ORAL_TABLET | Freq: Two times a day (BID) | ORAL | 3 refills | Status: AC

## 2017-12-04 NOTE — Progress Notes (Addendum)
Pt is alert and oriented with no distress, vitals stable, waiting on lab results.

## 2017-12-04 NOTE — Care Management Note (Signed)
Case Management Note  Patient Details  Name: Scott Rowe MRN: [id number] Date of Birth: 08/21/1971  Subjective/Objective:     Pneumonia/ TB                  Action/Plan   12/04/2017 - Patient is discharging home today; CM Contact health department prior to discharge to ensure patient gets his TB medications(Carolyn Chaffin, [phone]) as requested by Infectious Disease MD. Also Cassandra RN with the Health Dept called [phone]. They are to see the patient this after noon at his home today. CM called Dr Daiva EvesVan Dam as requested by Attending MD to finalize dc plans. Alexis GoodellB Leeona Mccardle RN,MHA,BSN [phone]  11/30/2017 -Action/Plan: Patient is independent prior to admission; works fulltime; he is to follow up with the Health Department after discharge; CM talked to [employer] Supervisor [facility] [employer] [phone]( [phone]); she is to be called when pt is discharged; he needs to received his TB medication the day of discharge and the Health Dept Nurse will visit the patient the following day to administer his medication. The Health Department has the legal authority and responsibility to coordinate all TB efforts in its jurisdiction. Alexis GoodellB Anelise Staron RN,MHA,BSN [phone]  Expected Discharge Date:    12/04/2017              Expected Discharge Plan:  Home/Self Care  Discharge planning Services  CM Consult  Status of Service:  In process, will continue to follow  Reola MosherChandler, Gicela Schwarting L, RN,MHA,BSN [phone] 12/04/2017, 12:26 PM

## 2017-12-04 NOTE — Discharge Summary (Signed)
Scott Rowe, is a 47 y.o. male  DOB 1971-06-12  MRN [id number].  Admission date:  11/16/2017  Admitting Physician  Phillips Grout, MD  Discharge Date:  12/04/2017   Primary MD  Patient, No Pcp Per  Recommendations for primary care physician for things to follow:   1)Take all your medications including TB (tuberculosis) medications as prescribed 2) follow-up with infectious disease specialist Dr. Tommy Medal in 2 weeks 3)You need repeat chest x-ray with infectious disease specialist in 2 weeks 4) call or return sooner if fevers, chills, night sweats, increasing shortness of breath, worsening cough or chest pains 5) you have an irregular heartbeat called atrial fibrillation-you need to take Cardizem and metoprolol as well as baby aspirin as prescribed 6)Contact health department  to ensure patient you get your TB medications [address] [place], [phone])   Admission Diagnosis  Shortness of breath [R06.02] Elevated blood pressure reading [R03.0] Pleural effusion [J90] Essential hypertension [I10]   Discharge Diagnosis  Shortness of breath [R06.02] Elevated blood pressure reading [R03.0] Pleural effusion [J90] Essential hypertension [I10]    Principal Problem:   PNA (pneumonia) Active Problems:   Respiratory failure (HCC)   Hypertension   Pleural effusion   S/P thoracentesis   Abnormal transaminases   Extrapulmonary tuberculosis   Chest pain      Past Medical History:  Diagnosis Date  . Hypertension     Past Surgical History:  Procedure Laterality Date  . IR THORACENTESIS ASP PLEURAL SPACE W/IMG GUIDE  11/18/2017  . IR THORACENTESIS ASP PLEURAL SPACE W/IMG GUIDE  11/24/2017      HPI  from the history and physical done on the day of admission:     Chief Complaint: Shortness of breath and cough  HPI: Scott Rowe is a 47 y.o. male with medical history significant of  hypertension comes in  with 3 weeks of worsening shortness of breath and cough.  Patient reports that he has been coughing which has been productive and having a difficulty breathing with chest pain for 3 weeks now.  The pain is worse with deep inspiration.  He denies any fevers.  He has been having chills.  He denies any lower extremity edema.  He does not have a history of any congestive heart failure.  He does have hypertension.  He denies any nausea vomiting or diarrhea.  Patient found to have pneumonia and a left sided pleural effusion.  He is hypoxic with exertion.  He is referred for admission for community acquired pneumonia.  He has not seen a physician for this and is not been on antibiotics in over a year.  He has not had any recent hospitalizations.     Hospital Course:   Brief Summary: Scott Rowe is a 47 y.o. male originally from [place] who was admitted 11/16/17 with with 3 weeks of progressive dyspnea, cough, and pleuritic chest pain. CT chest on admission illustrated a L>R pleural effusion with compressive atelectasis vs infiltration with nonspecific enlarged lymph nodes. Pleural fluid analysis x3 indicating an exudative  process with unclear etiology at this point.   Plan:- 1)Acute Hypoxic Respiratory Failure- much improved overall, no further hypoxia, afebrile,  repeat chest x-ray today without significant effusion, she was treated for presumed pneumonia with recurrent exudative pleural effusion secondary to parapneumonic effusion versus TB  -status post thoracentesis x2,   Per Dr Servando Snare VATS might be low yield. Per ID ADA negative, FABtimes 3 negative, pleural fluids and QuantiFeron negative.Autoimmune work up (RF weakly positive at 15.8, ANA neg, ESR grossly elevated at 70), Autoimmune workup negative except for mild elevation in rheumatoid factor.patient had a positive PPD however indigenous people from [place] post prior BCG vaccination tend to have positive PPDs,Follow-up chest x-ray in 2 weeks as an  outpatient , Follow-up with ID clinic in 7-14 days, - Contact health department prior to discharge to ensure patient gets his TB medications ([place], [phone])  2)Possible Pulmonary Tuberculosis- QuantiFERON negative, PPD positive , patient had a positive PPD however indigenous people from [place] post prior BCG vaccination tend to have positive PPDs, c/n  ethambutol 1600 mg daily INH 300 mg daily, pyrazinamide 2000 mg twice daily rifampin 600 mg daily and pyridoxine  3)Paroxysmal Atrial Flutter- CHADSVasc- score of 1 (HTN),  c/n aspirin 81 mg   continue Cardizem and metoprolol for rate control        EVENTS 1/23 - pleural fluid - exudative with LDH 237 , protien  4.8 and glucose not avail. Mesothelial cells +, 69% lymphs 1/29  - pleural efluid with Gluc 122, LDH 130, and 69% polys and mesothelia cells +. AFB smear negative. 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  Discharge Condition: improved  Follow UP  Follow-up Information    Brand Males, MD In 1 week.   Specialty:  Pulmonary Disease Why:  Office will call patient Contact information: [place] [address] [phone]           Consults obtained - Pulm/CT surgery, ID   Diet and Activity recommendation:  As advised  Discharge Instructions     Discharge Instructions    (HEART FAILURE PATIENTS) Call MD:  Anytime you have any of the following symptoms: 1) 3 pound weight gain in 24 hours or 5 pounds in 1 week 2) shortness of breath, with or without a dry hacking cough 3) swelling in the hands, feet or stomach 4) if you have to sleep on extra pillows at night in order to breathe.   Complete by:  As directed    Call MD for:   Complete by:  As directed    Call MD for:  difficulty breathing, headache or visual disturbances   Complete by:  As directed    Call MD for:  persistant dizziness or  light-headedness   Complete by:  As directed    Call MD for:  persistant nausea and vomiting   Complete by:  As directed    Call MD for:  severe uncontrolled pain   Complete by:  As directed    Call MD for:  temperature >100.4   Complete by:  As directed    Diet - low sodium heart healthy   Complete by:  As directed    Discharge instructions   Complete by:  As directed    1)Take all your medications including TB (tuberculosis) medications as prescribed 2) follow-up with infectious disease specialist Dr. Tommy Medal in 2 weeks 3)You need repeat chest x-ray  with infectious disease specialist in 2 weeks 4) call or return sooner if fevers, chills, night sweats, increasing shortness of breath, worsening cough or chest pains 5) you have an irregular heartbeat called atrial fibrillation-you need to take Cardizem and metoprolol as well as baby aspirin as prescribed 6)Contact health department  to ensure patient you get your TB medications [address] [place], [phone])   Increase activity slowly   Complete by:  As directed        Discharge Medications     Allergies as of 12/04/2017   No Known Allergies     Medication List    TAKE these medications   acetaminophen 500 MG tablet Commonly known as:  TYLENOL Take 500 mg by mouth every 6 (six) hours as needed for mild pain.   aspirin 81 MG EC tablet Take 1 tablet (81 mg total) by mouth daily. With food Start taking on:  12/05/2017   diltiazem 120 MG 24 hr capsule Commonly known as:  CARDIZEM CD Take 1 capsule (120 mg total) by mouth daily.   ethambutol 400 MG tablet Commonly known as:  MYAMBUTOL Take 4 tablets (1,600 mg total) by mouth daily. Start taking on:  12/05/2017   GOODY HEADACHE PO Take 1 packet by mouth every 6 (six) hours as needed (pain).   ibuprofen 200 MG tablet Commonly known as:  MOTRIN IB Take 2 tablets (400 mg total) by mouth every 6 (six) hours as needed for fever, headache or moderate pain. With food   isoniazid  300 MG tablet Commonly known as:  NYDRAZID Take 1 tablet (300 mg total) by mouth daily. Start taking on:  12/05/2017   metoprolol tartrate 100 MG tablet Commonly known as:  LOPRESSOR Take 1 tablet (100 mg total) by mouth 2 (two) times daily.   oxyCODONE 5 MG immediate release tablet Commonly known as:  Oxy IR/ROXICODONE Take 1 tablet (5 mg total) by mouth every 6 (six) hours as needed for moderate pain or severe pain.   pyrazinamide 500 MG tablet Take 4 tablets (2,000 mg total) by mouth daily. Start taking on:  12/05/2017   pyridOXINE 50 MG tablet Commonly known as:  B-6 Take 1 tablet (50 mg total) by mouth daily. Start taking on:  12/05/2017   rifampin 300 MG capsule Commonly known as:  RIFADIN Take 2 capsules (600 mg total) by mouth daily. Start taking on:  12/05/2017   vitamin C 500 MG tablet Commonly known as:  ASCORBIC ACID Take 1 tablet (500 mg total) by mouth 2 (two) times daily. What changed:  when to take this       Major procedures and Radiology Reports - PLEASE review detailed and final reports for all details, in brief -   Dg Chest 1 View  Result Date: 11/24/2017 CLINICAL DATA:  47 year old male with a history of recent left-sided thoracentesis EXAM: CHEST 1 VIEW COMPARISON:  Chest x-ray 11/23/2017, CT 11/23/2017 FINDINGS: Cardiomediastinal silhouette unchanged. Improved aeration at the left base. Blunting of left costophrenic angle. No pneumothorax. No confluent airspace disease. IMPRESSION: Improved aeration at the left base with decreased pleural effusion and no evidence of pneumothorax. Electronically Signed   By: Corrie Mckusick D.O.   On: 11/24/2017 14:30   Dg Chest 1 View  Result Date: 11/18/2017 CLINICAL DATA:  Post left-sided thoracentesis. History of hypertension. EXAM: CHEST 1 VIEW COMPARISON:  11/16/2017; chest CT-11/16/2010 FINDINGS: Interval reduction/near resolution of residual trace left-sided effusion post thoracentesis. No pneumothorax. Unchanged trace  right-sided pleural effusion. Improved aeration of  the left lower lung with persistent left mid and basilar heterogeneous/consolidative opacities. Pulmonary vasculature remains indistinct with cephalization of flow. No acute osseus abnormalities. IMPRESSION: 1. Interval reduction/near resolution of left-sided effusion post thoracentesis. No pneumothorax. 2. Improved aeration of left lower lung with similar findings of suspected pulmonary edema and bibasilar atelectasis. Electronically Signed   By: Sandi Mariscal M.D.   On: 11/18/2017 12:58   Dg Chest 2 View  Result Date: 11/30/2017 CLINICAL DATA:  Hypertension, asthma, dyspnea, chest pain, productive cough, BILATERAL pleural effusions post LEFT thoracentesis EXAM: CHEST  2 VIEW COMPARISON:  11/28/2017 FINDINGS: Enlargement of cardiac silhouette. Mediastinal contours and pulmonary vascularity normal. Persistent LEFT pleural effusion and basilar opacity question atelectasis versus consolidation. Tiny RIGHT pleural effusion. Remaining lungs clear. No additional infiltrate or pneumothorax. Bones unremarkable. IMPRESSION: Persistent LEFT pleural effusion with atelectasis versus consolidation at LEFT base. Enlargement of cardiac silhouette with tiny RIGHT pleural effusion. Electronically Signed   By: Lavonia Dana M.D.   On: 11/30/2017 13:35   Dg Chest 2 View  Result Date: 11/26/2017 CLINICAL DATA:  Pleural effusions.  Recent left thoracentesis. EXAM: CHEST  2 VIEW COMPARISON:  11/25/2017 FINDINGS: The heart size and mediastinal contours are within normal limits. Small left pleural effusion shows no significant change in size. There is persistent atelectasis or consolidation in the left lower lobe. No evidence of pneumothorax. Right lung is clear. IMPRESSION: No significant change in small left pleural effusion and left lower lobe atelectasis versus consolidation. Electronically Signed   By: Earle Gell M.D.   On: 11/26/2017 09:05   Dg Chest 2 View  Result Date:  11/16/2017 CLINICAL DATA:  Chest pain and shortness of breath for several weeks EXAM: CHEST  2 VIEW COMPARISON:  None. FINDINGS: Cardiac shadow is mildly enlarged. Small right-sided pleural effusion is seen. A large left-sided pleural effusion is noted with likely underlying infiltrate. No pneumothorax is seen. No bony abnormality is noted. IMPRESSION: Large left pleural effusion with likely underlying infiltrate. Minimal blunting of the right costophrenic angle is noted as well. Electronically Signed   By: Inez Catalina M.D.   On: 11/16/2017 11:55   Ct Chest W Contrast  Result Date: 11/23/2017 CLINICAL DATA:  Pleural effusion follow up EXAM: CT CHEST WITH CONTRAST TECHNIQUE: Multidetector CT imaging of the chest was performed during intravenous contrast administration. CONTRAST:  75 cc ISOVUE-300 IOPAMIDOL (ISOVUE-300) INJECTION 61% COMPARISON:  CT chest dated 11/18/2017. chest CT dated 11/16/2017. FINDINGS: Cardiovascular: No thoracic aortic aneurysm or evidence of aortic dissection. Cardiomegaly. Small pericardial effusion, similar to previous exam. Mediastinum/Nodes: Stable mildly prominent lymph node within the right lower paratracheal space, measuring 12 mm short axis dimension. Additional stable mildly prominent lymph node within the subcarinal space, also measuring approximately 12 mm short axis dimension. Additional scattered smaller lymph nodes within the mediastinum. No new lymphadenopathy. Esophagus appears normal.  Trachea appears normal. Lungs/Pleura: Moderate-sized left pleural effusion, with adjacent compressive atelectasis, increased compared to the chest CT of 11/18/2017, less than the pleural effusion that was seen on 11/16/2017. Small right pleural effusion with adjacent compressive atelectasis. Lungs otherwise clear.  No pneumothorax. Upper Abdomen: No acute findings within the upper abdomen. Musculoskeletal: No acute or suspicious osseous finding. IMPRESSION: 1. Moderate-sized left pleural  effusion, with adjacent compressive atelectasis. The pleural effusion has reaccumulated since chest CT of 11/18/2017 but is not as large as the pleural effusion seen on earlier chest CT of 11/16/2017. 2. Small right pleural effusion, similar to previous exams. 3. Small pericardial effusion,  similar to previous exam. Stable cardiomegaly. 4. Stable mild lymphadenopathy within the mediastinum, as detailed above. Electronically Signed   By: Franki Cabot M.D.   On: 11/23/2017 23:38   Ct Chest W Contrast  Result Date: 11/16/2017 CLINICAL DATA:  RIGHT upper quadrant RIGHT-side chest pain with shortness of breath for 2 weeks, severe chest pain when lying flat, unable to raise RIGHT arm above head, shortness of breath for 2 weeks, history hypertension EXAM: CT CHEST, ABDOMEN, AND PELVIS WITH CONTRAST TECHNIQUE: Multidetector CT imaging of the chest, abdomen and pelvis was performed following the standard protocol during bolus administration of intravenous contrast. Sagittal and coronal MPR images reconstructed from axial data set. Patient was unable to raise his RIGHT arm above his head, resulting in beam hardening artifacts at the chest and abdomen. CONTRAST:  123m ISOVUE-300 IOPAMIDOL (ISOVUE-300) INJECTION 61% IV. No oral contrast administered. COMPARISON:  None FINDINGS: CT CHEST FINDINGS Cardiovascular: Thoracic vascular structures grossly patent on nondedicated exam. Small pericardial effusion. Mediastinum/Nodes: Normal sized RIGHT cardiophrenic angle lymph nodes. Enlarged azygo-esophageal recess node 16 mm short axis image 32. Minimally enlarged RIGHT paratracheal node 11 mm short axis image 26. No definite esophageal abnormalities. Base of cervical region unremarkable. Lungs/Pleura: Large LEFT pleural effusion with subtotal atelectasis of LEFT lower lobe and partial atelectasis of LEFT upper lobe in lingula. Small RIGHT pleural effusion with minimal compressive atelectasis of adjacent posterior RIGHT lower lobe  base. Scattered respiratory motion artifacts. No definite infiltrate or pneumothorax. Musculoskeletal: No acute osseous abnormalities. CT ABDOMEN PELVIS FINDINGS Hepatobiliary: Gallbladder and liver normal appearance Pancreas: Normal appearance Spleen: Normal appearance Adrenals/Urinary Tract: Adrenal glands, kidneys, ureters, and bladder normal appearance Stomach/Bowel: Normal appearing retrocecal appendix. Distal stomach incompletely distended. Stomach and bowel loops otherwise normal appearance for technique Vascular/Lymphatic: Vascular structures normal appearance. No adenopathy. Reproductive: Minimal prostatic enlargement. Other: No free air, free fluid, hernia, or inflammatory process Musculoskeletal: No acute osseous abnormalities. IMPRESSION: Large LEFT pleural effusion with significant atelectasis of the LEFT lung. Small RIGHT pleural effusion with minimal atelectasis of the adjacent RIGHT lower lobe. Nonspecific mildly enlarged mediastinal lymph nodes. No acute intra-abdominal or intrapelvic abnormalities. No acute osseous abnormalities. Electronically Signed   By: MLavonia DanaM.D.   On: 11/16/2017 13:53   Ct Angio Chest Pe W Or Wo Contrast  Result Date: 11/18/2017 CLINICAL DATA:  Shortness of breath.  Recent thoracentesis. EXAM: CT ANGIOGRAPHY CHEST WITH CONTRAST TECHNIQUE: Multidetector CT imaging of the chest was performed using the standard protocol during bolus administration of intravenous contrast. Multiplanar CT image reconstructions and MIPs were obtained to evaluate the vascular anatomy. CONTRAST:  529mISOVUE-370 IOPAMIDOL (ISOVUE-370) INJECTION 76% COMPARISON:  Chest radiograph 11/18/2017 FINDINGS: Cardiovascular: Contrast injection is sufficient to demonstrate satisfactory opacification of the pulmonary arteries to the segmental level. There is no pulmonary embolus. The main pulmonary artery is within normal limits for size. There is a normal 3-vessel arch branching pattern without  evidence of acute aortic syndrome. There is noaortic atherosclerosis. Heart size is enlarged. Small pericardial effusion is unchanged. Mediastinum/Nodes: No mediastinal, hilar or axillary lymphadenopathy. The visualized thyroid and thoracic esophageal course are unremarkable. Lungs/Pleura: Ground-glass opacities throughout the lingula and left lower lobe with markedly decreased size of pleural effusion, which is now small. Unchanged medium-sized right pleural effusion. Mild right basilar atelectasis. No pneumothorax. Upper Abdomen: Contrast bolus timing is not optimized for evaluation of the abdominal organs. Within this limitation, the visualized organs of the upper abdomen are normal. Musculoskeletal: No chest wall abnormality. No acute  or significant osseous findings. Review of the MIP images confirms the above findings. IMPRESSION: 1. No pulmonary embolus. 2. Greatly decreased size of left pleural effusion following thoracentesis with multifocal ground-glass opacity throughout the lingula and left lower lobe likely indicating re-expansion pulmonary edema. 3. No pneumothorax. 4. Small pericardial effusion, unchanged. Electronically Signed   By: Ulyses Jarred M.D.   On: 11/18/2017 18:48   Ct Abdomen Pelvis W Contrast  Result Date: 11/16/2017 CLINICAL DATA:  RIGHT upper quadrant RIGHT-side chest pain with shortness of breath for 2 weeks, severe chest pain when lying flat, unable to raise RIGHT arm above head, shortness of breath for 2 weeks, history hypertension EXAM: CT CHEST, ABDOMEN, AND PELVIS WITH CONTRAST TECHNIQUE: Multidetector CT imaging of the chest, abdomen and pelvis was performed following the standard protocol during bolus administration of intravenous contrast. Sagittal and coronal MPR images reconstructed from axial data set. Patient was unable to raise his RIGHT arm above his head, resulting in beam hardening artifacts at the chest and abdomen. CONTRAST:  17m ISOVUE-300 IOPAMIDOL (ISOVUE-300)  INJECTION 61% IV. No oral contrast administered. COMPARISON:  None FINDINGS: CT CHEST FINDINGS Cardiovascular: Thoracic vascular structures grossly patent on nondedicated exam. Small pericardial effusion. Mediastinum/Nodes: Normal sized RIGHT cardiophrenic angle lymph nodes. Enlarged azygo-esophageal recess node 16 mm short axis image 32. Minimally enlarged RIGHT paratracheal node 11 mm short axis image 26. No definite esophageal abnormalities. Base of cervical region unremarkable. Lungs/Pleura: Large LEFT pleural effusion with subtotal atelectasis of LEFT lower lobe and partial atelectasis of LEFT upper lobe in lingula. Small RIGHT pleural effusion with minimal compressive atelectasis of adjacent posterior RIGHT lower lobe base. Scattered respiratory motion artifacts. No definite infiltrate or pneumothorax. Musculoskeletal: No acute osseous abnormalities. CT ABDOMEN PELVIS FINDINGS Hepatobiliary: Gallbladder and liver normal appearance Pancreas: Normal appearance Spleen: Normal appearance Adrenals/Urinary Tract: Adrenal glands, kidneys, ureters, and bladder normal appearance Stomach/Bowel: Normal appearing retrocecal appendix. Distal stomach incompletely distended. Stomach and bowel loops otherwise normal appearance for technique Vascular/Lymphatic: Vascular structures normal appearance. No adenopathy. Reproductive: Minimal prostatic enlargement. Other: No free air, free fluid, hernia, or inflammatory process Musculoskeletal: No acute osseous abnormalities. IMPRESSION: Large LEFT pleural effusion with significant atelectasis of the LEFT lung. Small RIGHT pleural effusion with minimal atelectasis of the adjacent RIGHT lower lobe. Nonspecific mildly enlarged mediastinal lymph nodes. No acute intra-abdominal or intrapelvic abnormalities. No acute osseous abnormalities. Electronically Signed   By: MLavonia DanaM.D.   On: 11/16/2017 13:53   Dg Chest Port 1 View  Result Date: 12/04/2017 CLINICAL DATA:  Shortness of  breath and respiratory failure EXAM: PORTABLE CHEST 1 VIEW COMPARISON:  12/02/2017 FINDINGS: Cardiac shadow is stable. The lungs are well aerated bilaterally. Very minimal left basilar atelectasis is seen. No sizable effusion is noted. No bony abnormality is seen. IMPRESSION: Minimal left basilar atelectasis. Electronically Signed   By: MInez CatalinaM.D.   On: 12/04/2017 09:21   Dg Chest Port 1 View  Result Date: 12/02/2017 CLINICAL DATA:  Post left thoracentesis EXAM: PORTABLE CHEST 1 VIEW COMPARISON:  11/30/2017 FINDINGS: Cardiomegaly with vascular congestion. Decreasing left effusion following thoracentesis. No pneumothorax. Continued left lower lobe airspace opacity. Right lung clear. IMPRESSION: No pneumothorax following left thoracentesis. Left basilar atelectasis or infiltrate. Electronically Signed   By: KRolm BaptiseM.D.   On: 12/02/2017 10:23   Dg Chest Port 1 View  Result Date: 11/28/2017 CLINICAL DATA:  Dyspnea. EXAM: PORTABLE CHEST 1 VIEW COMPARISON:  11/26/2017 FINDINGS: Cardiomediastinal silhouette is enlarged. There is persistent left  lower lobe airspace consolidation with moderate in size left pleural effusion. Small right subpulmonic pleural effusion cannot be excluded. No evidence of pneumothorax. Osseous structures are without acute abnormality. Soft tissues are grossly normal. IMPRESSION: Persistent left lower lobe airspace consolidation and moderate in size left pleural effusion. Probable small subpulmonic right pleural effusion. Enlarged cardiac silhouette. Electronically Signed   By: Fidela Salisbury M.D.   On: 11/28/2017 09:35   Dg Chest Port 1 View  Result Date: 11/25/2017 CLINICAL DATA:  Left-sided thoracentesis yesterday EXAM: PORTABLE CHEST 1 VIEW COMPARISON:  11/24/2017 FINDINGS: Slight interval increase in left base atelectasis with probable small left effusion. Right lung remains clear. No evidence for pneumothorax The cardio pericardial silhouette is enlarged. The  visualized bony structures of the thorax are intact. Telemetry leads overlie the chest. IMPRESSION: Slight increase in left base atelectasis/effusion. Electronically Signed   By: Misty Stanley M.D.   On: 11/25/2017 09:59   Dg Chest Port 1 View  Result Date: 11/23/2017 CLINICAL DATA:  47 year old male with shortness of breath. Status post ultrasound-guided thoracentesis on 11/18/2017 large left pleural effusion. Negative cytology and cultures of the left pleural fluid. EXAM: PORTABLE CHEST 1 VIEW COMPARISON:  11/21/2017 FINDINGS: Portable AP semi upright view at 0642 hrs. Stable lung volumes. Stable mediastinal contours. Visualized tracheal air column is within normal limits. No pneumothorax. Increased dense left lung base opacity since the post thoracentesis image on 11/18/2017. Stable obscuration of the left hemidiaphragm since 11/21/2017. The right lung remains clear. Paucity of bowel gas in the upper abdomen. IMPRESSION: 1. Increasing opacity at the left lung base since 11/18/2017 could reflect recurrent left pleural effusion and/or atelectasis versus consolidation. 2. Negative right lung. Electronically Signed   By: Genevie Ann M.D.   On: 11/23/2017 07:47   Dg Chest Portable 1 View  Result Date: 11/21/2017 CLINICAL DATA:  Pleural effusion. EXAM: PORTABLE CHEST 1 VIEW COMPARISON:  11/18/2017 FINDINGS: Heart size upper limits of normal. Left mid and lower lung bronchopneumonia remains evident. There are pleural effusions, larger on the left than the right. Right lung appears otherwise clear. IMPRESSION: Persistent pneumonia in the left mid and lower lung. Pleural effusions left larger than right. Electronically Signed   By: Nelson Chimes M.D.   On: 11/21/2017 15:17   US Abdomen Limited Ruq  Result Date: 11/26/2017 CLINICAL DATA:  Elevated LFTs EXAM: ULTRASOUND ABDOMEN LIMITED RIGHT UPPER QUADRANT COMPARISON:  None. FINDINGS: Gallbladder: No gallstones or wall thickening visualized. No sonographic Murphy  sign noted by sonographer. Common bile duct: Diameter: 4.3 mm Liver: No focal lesion identified. Within normal limits in parenchymal echogenicity. Portal vein is patent on color Doppler imaging with normal direction of blood flow towards the liver. IMPRESSION: Normal study.  No cause for elevated LFTs identified. Electronically Signed   By: Dorise Bullion III M.D   On: 11/26/2017 20:18   Ir Thoracentesis Asp Pleural Space W/img Guide  Result Date: 11/24/2017 INDICATION: Patient with concern for TB, now with recurrent left pleural effusion. Request is made for diagnostic and therapeutic thoracentesis. EXAM: ULTRASOUND GUIDED DIAGNOSTIC AND THERAPEUTIC THORACENTESIS MEDICATIONS: 10 mL 2% lidocaine COMPLICATIONS: None immediate. PROCEDURE: An ultrasound guided thoracentesis was thoroughly discussed with the patient and questions answered. The benefits, risks, alternatives and complications were also discussed. The patient understands and wishes to proceed with the procedure. Written consent was obtained. Ultrasound was performed to localize and mark an adequate pocket of fluid in the left chest. The area was then prepped and draped in the normal  sterile fashion. 2% Lidocaine was used for local anesthesia. Under ultrasound guidance a Safe-T-Centesis catheter was introduced. Thoracentesis was performed. The catheter was removed and a dressing applied. FINDINGS: A total of approximately 800 mL of amber fluid was removed. Samples were sent to the laboratory as requested by the clinical team. IMPRESSION: Successful ultrasound guided diagnostic and therapeutic left thoracentesis yielding 800 mL of pleural fluid. Read by: Brynda Greathouse PA-C Electronically Signed   By: Sandi Mariscal M.D.   On: 11/24/2017 16:14   Ir Thoracentesis Asp Pleural Space W/img Guide  Result Date: 11/18/2017 INDICATION: Pneumonia with large left pleural effusion. Request is made for diagnostic and therapeutic thoracentesis. EXAM: ULTRASOUND  GUIDED DIAGNOSTIC AND THERAPEUTIC THORACENTESIS MEDICATIONS: 2% lidocaine COMPLICATIONS: None immediate. PROCEDURE: An ultrasound guided thoracentesis was thoroughly discussed with the patient and questions answered. The benefits, risks, alternatives and complications were also discussed. The patient understands and wishes to proceed with the procedure. Written consent was obtained. Ultrasound was performed to localize and mark an adequate pocket of fluid in the left chest. The area was then prepped and draped in the normal sterile fashion. 2% lidocaine was used for local anesthesia. Under ultrasound guidance a Safe-T-Centesis catheter was introduced. Thoracentesis was performed. The catheter was removed and a dressing applied. FINDINGS: A total of approximately 1.4 L of amber fluid was removed. Samples were sent to the laboratory as requested by the clinical team. IMPRESSION: Successful ultrasound guided left thoracentesis yielding 1.4 L of pleural fluid. Read by: Saverio Danker, PA-C Electronically Signed   By: Jacqulynn Cadet M.D.   On: 11/18/2017 14:05   US Thoracentesis Asp Pleural Space W/img Guide  Result Date: 12/02/2017 INDICATION: Patient with left pleural effusion. Request is made for diagnostic and therapeutic thoracentesis. EXAM: ULTRASOUND GUIDED DIAGNOSTIC AND THERAPEUTIC LEFT THORACENTESIS MEDICATIONS: 10 mL 1% lidocaine COMPLICATIONS: None immediate. PROCEDURE: An ultrasound guided thoracentesis was thoroughly discussed with the patient and questions answered. The benefits, risks, alternatives and complications were also discussed. The patient understands and wishes to proceed with the procedure. Written consent was obtained. Ultrasound was performed to localize and mark an adequate pocket of fluid in the left chest. The area was then prepped and draped in the normal sterile fashion. 1% Lidocaine was used for local anesthesia. Under ultrasound guidance a Safe-T-Centesis catheter was introduced.  Thoracentesis was performed. The catheter was removed and a dressing applied. FINDINGS: A total of approximately 600 mL of yellow fluid was removed. Samples were sent to the laboratory as requested by the clinical team. IMPRESSION: Successful ultrasound guided diagnostic and therapeutic left thoracentesis yielding 600 mL of pleural fluid. Read by: Brynda Greathouse PA-C Electronically Signed   By: Aletta Edouard M.D.   On: 12/02/2017 10:35    Micro Results    Recent Results (from the past 240 hour(s))  Culture, blood (routine x 2)     Status: None   Collection Time: 11/29/17  1:24 PM  Result Value Ref Range Status   Specimen Description BLOOD LEFT FOREARM  Final   Special Requests IN PEDIATRIC BOTTLE Blood Culture adequate volume  Final   Culture   Final    NO GROWTH 5 DAYS Performed at [facility], [place] [address] [place], [place] [address]    Report Status 12/04/2017 FINAL  Final  Culture, blood (routine x 2)     Status: None   Collection Time: 11/29/17  1:27 PM  Result Value Ref Range Status   Specimen Description BLOOD LEFT HAND  Final  Special Requests IN PEDIATRIC BOTTLE Blood Culture adequate volume  Final   Culture   Final    NO GROWTH 5 DAYS Performed at [facility], [place] [address], [place], [place] [address]    Report Status 12/04/2017 FINAL  Final  Gram stain     Status: None   Collection Time: 12/02/17 10:05 AM  Result Value Ref Range Status   Specimen Description FLUID PLEURAL LEFT  Final   Special Requests NONE  Final   Gram Stain   Final    RARE WBC PRESENT, PREDOMINANTLY PMN NO ORGANISMS SEEN Performed at [facility], [place] [address], [place], [place] [address]    Report Status 12/02/2017 FINAL  Final  Fungus Culture With Stain     Status: None (Preliminary result)   Collection Time: 12/02/17 10:05 AM  Result Value Ref Range Status   Fungus Stain Final report  Final    Comment: (NOTE) Performed At: [facility] [place], [place] [id number] Rush Farmer MD QK:[id number]    Fungus (Mycology) Culture PENDING  Incomplete   Fungal Source FLUID  Final    Comment: PLEURAL LEFT   Acid Fast Smear (AFB)     Status: None   Collection Time: 12/02/17 10:05 AM  Result Value Ref Range Status   AFB Specimen Processing Concentration  Final   Acid Fast Smear Negative  Final    Comment: (NOTE) Performed At: [facility] [place], [place] [id number] Rush Farmer MD OV:[id number]    Source (AFB) FLUID  Final    Comment: PLEURAL LEFT   Culture, body fluid-bottle     Status: None (Preliminary result)   Collection Time: 12/02/17 10:05 AM  Result Value Ref Range Status   Specimen Description FLUID PLEURAL LEFT  Final   Special Requests NONE  Final   Culture   Final    NO GROWTH 2 DAYS Performed at [facility], [address], [place], [place] [address]    Report Status PENDING  Incomplete  Fungus Culture Result     Status: None   Collection Time: 12/02/17 10:05 AM  Result Value Ref Range Status   Result 1 Comment  Final    Comment: (NOTE) KOH/Calcofluor preparation:  no fungus observed. Performed At: [facility] [place], [place] [id number] Rush Farmer MD VU:[id number]        Today   Subjective    Scott Rowe today has no new complaints, eager to go home          Patient has been seen and examined prior to discharge   Objective   Blood pressure 125/88, pulse 77, temperature 98.8 F (37.1 C), temperature source Oral, resp. rate 20, height _0  (1.727 m), weight 88.2 kg (194 lb 8 oz), SpO2 99 %.   Intake/Output Summary (Last 24 hours) at 12/04/2017 1854 Last data filed at 12/04/2017 1000 Gross per 24 hour  Intake 720 ml  Output 1000 ml  Net -280 ml    Exam Gen:- Awake  In no apparent distress  HEENT:- [place].AT,   Neck-Supple Neck,No JVD,  Lungs- mostly clear no wheezing or adventitious sounds CV- S1, S2 normal Abd-  +ve  B.Sounds, Abd Soft, No tenderness,    Extremity/Skin:- Intact peripheral pulses   Psych-affect is appropriate Neuro-no new focal deficits, no tremors   Data Review   CBC w Diff:  Lab Results  Component Value Date   WBC 12.7 (H) 12/03/2017  HGB 10.9 (L) 12/03/2017   HCT 34.3 (L) 12/03/2017   PLT 407 (H) 12/03/2017   CMP:  Lab Results  Component Value Date   NA 139 12/03/2017   K 4.1 12/03/2017   CL 104 12/03/2017   CO2 22 12/03/2017   BUN 15 12/03/2017   CREATININE 0.95 12/03/2017   PROT 7.1 11/26/2017   ALBUMIN 2.3 (L) 11/26/2017   BILITOT 0.6 11/26/2017   ALKPHOS 156 (H) 11/26/2017   AST 42 (H) 11/26/2017   ALT 94 (H) 11/26/2017    Total Discharge time is about 33 minutes  Roxan Hockey M.D on 12/04/2017 at [place] PM  [facility]   Office  [phone]  Voice Recognition Viviann Spare dictation system was used to create this note, attempts have been made to correct errors. Please contact the author with questions and/or clarifications.

## 2017-12-04 NOTE — Progress Notes (Addendum)
Name: Scott Rowe MRN: [id number] DOB: [birth year]    ADMISSION DATE:  11/16/2017 CONSULTATION DATE: November 19, 2017  REFERRING MD : Hospitalist service  CHIEF COMPLAINT: Shortness of breath cough, exudative left effusion  BRIEF PATIENT DESCRIPTION:  47 year old with past medical history of hypertension  Admitted on 1/21 with dyspnea, chest pain.  Noted to have large left pleural effusion status post thoracentesis on 1/23 with removal of 1.4 L, repeat thoracentesis on 1/29 with removal of 800 mL.  The etiology of the effusion is unclear.  Labs tests show it is exudative in nature with negative cultures and cytology.  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 Reconsult 2/5 for persistent effusion with chest pain and dyspnea. He has been seen by CVTS and he is not a candidate for VATS surgery or biopsy. Autoimmune workup negative except for mild elevation in rheumatoid factor.  EVENTS 1/23 - pleural fluid - exudative with LDH 237 , protien  4.8 and glucose not avail. Mesothelial cells +, 69% lymphs 1/29  - pleural efluid with Gluc 122, LDH 130, and 69% polys and mesothelia cells +. AFB smear negative. 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  SUBJECTIVE/OVERNIGHT/INTERVAL HX: Feels well with no new symptoms.  VITAL SIGNS: Temp:  [98.8 F (37.1 C)-99.8 F (37.7 C)] 98.8 F (37.1 C) (02/08 0416) Pulse Rate:  [77-88] 77 (02/08 0416) Resp:  [20] 20 (02/08 0416) BP: (118-125)/(78-88) 125/88 (02/08 0416) SpO2:  [98 %-99 %] 99 % (02/08 0416) Weight:  [194 lb 8 oz (88.2 kg)] 194 lb 8 oz (88.2 kg) (02/08 0416)  PHYSICAL EXAMINATION: Blood pressure 125/88, pulse 77, temperature 98.8 F (37.1 C), temperature source Oral, resp. rate 20, height 5\' 8"  (1.727 m), weight 194 lb 8 oz (88.2 kg), SpO2 99 %. Gen:      No acute distress HEENT:  EOMI, sclera  anicteric Neck:     No masses; no thyromegaly Lungs:    Clear to auscultation bilaterally; normal respiratory effort CV:         Regular rate and rhythm; no murmurs Abd:      + bowel sounds; soft, non-tender; no palpable masses, no distension Ext:    No edema; adequate peripheral perfusion Skin:      Warm and dry; no rash Neuro: alert and oriented x 3 Psych: normal mood and affect  PULMONARY No results for input(s): PHART, PCO2ART, PO2ART, HCO3, TCO2, O2SAT in the last 168 hours.  Invalid input(s): PCO2, PO2  CBC Recent Labs  Lab 11/29/17 1323 12/02/17 0755 12/03/17 0503  HGB 11.2* 10.9* 10.9*  HCT 35.1* 33.7* 34.3*  WBC 14.5* 11.2* 12.7*  PLT 487* 429* 407*    COAGULATION No results for input(s): INR in the last 168 hours.  CARDIAC  No results for input(s): TROPONINI in the last 168 hours. No results for input(s): PROBNP in the last 168 hours.   CHEMISTRY Recent Labs  Lab 11/28/17 0941 12/02/17 0755 12/03/17 0503  NA 136 137 139  K 4.2 4.0 4.1  CL 102 102 104  CO2 24 23 22   GLUCOSE 115* 100* 114*  BUN 18 14 15   CREATININE 1.13 0.97 0.95  CALCIUM 8.6* 8.5* 8.2*   Estimated Creatinine Clearance: 106 mL/min (by C-G formula based on SCr of 0.95 mg/dL).   LIVER No results for input(s): AST, ALT, ALKPHOS, BILITOT, PROT, ALBUMIN, INR in the last 168 hours.  INFECTIOUS No results for input(s): LATICACIDVEN, PROCALCITON in the last 168 hours.   ENDOCRINE CBG (last 3)  No results for input(s): GLUCAP in the last 72 hours.   IMAGING x48h  - image(s) personally visualized  -   highlighted in bold Dg Chest Port 1 View  Result Date: 12/04/2017 CLINICAL DATA:  Shortness of breath and respiratory failure EXAM: PORTABLE CHEST 1 VIEW COMPARISON:  12/02/2017 FINDINGS: Cardiac shadow is stable. The lungs are well aerated bilaterally. Very minimal left basilar atelectasis is seen. No sizable effusion is noted. No bony abnormality is seen. IMPRESSION: Minimal left  basilar atelectasis. Electronically Signed   By: Alcide CleverMark  Lukens M.D.   On: 12/04/2017 09:21   Dg Chest Port 1 View  Result Date: 12/02/2017 CLINICAL DATA:  Post left thoracentesis EXAM: PORTABLE CHEST 1 VIEW COMPARISON:  11/30/2017 FINDINGS: Cardiomegaly with vascular congestion. Decreasing left effusion following thoracentesis. No pneumothorax. Continued left lower lobe airspace opacity. Right lung clear. IMPRESSION: No pneumothorax following left thoracentesis. Left basilar atelectasis or infiltrate. Electronically Signed   By: Charlett NoseKevin  Dover M.D.   On: 12/02/2017 10:23   [place] Thoracentesis Asp Pleural Space W/img Guide  Result Date: 12/02/2017 INDICATION: Patient with left pleural effusion. Request is made for diagnostic and therapeutic thoracentesis. EXAM: ULTRASOUND GUIDED DIAGNOSTIC AND THERAPEUTIC LEFT THORACENTESIS MEDICATIONS: 10 mL 1% lidocaine COMPLICATIONS: None immediate. PROCEDURE: An ultrasound guided thoracentesis was thoroughly discussed with the patient and questions answered. The benefits, risks, alternatives and complications were also discussed. The patient understands and wishes to proceed with the procedure. Written consent was obtained. Ultrasound was performed to localize and mark an adequate pocket of fluid in the left chest. The area was then prepped and draped in the normal sterile fashion. 1% Lidocaine was used for local anesthesia. Under ultrasound guidance a Safe-T-Centesis catheter was introduced. Thoracentesis was performed. The catheter was removed and a dressing applied. FINDINGS: A total of approximately 600 mL of yellow fluid was removed. Samples were sent to the laboratory as requested by the clinical team. IMPRESSION: Successful ultrasound guided diagnostic and therapeutic left thoracentesis yielding 600 mL of pleural fluid. Read by: Loyce DysKacie Matthews PA-C Electronically Signed   By: Irish LackGlenn  Yamagata M.D.   On: 12/02/2017 10:35  I have reviewed the images  personally   ASSESSMENT / PLAN: Left exudative pleural effusion  Diagnosis is parapneumonic versus tuberculous effusion.  There is no evidence of malignancy and low suspicion for autoimmune process Underwent thoracentesis x 3. Not a candidate for VATs per CVTS. Overall effusion appears to be improving with decreasing LDH indicating that the inflammation is getting better.   Chest x-ray done today 2/8 mainly for patient reassurance shows no reaccumulation of fluid.  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 - NSAIDs for pain and antiinflammatory effect. Can switch ibuprofen from standing to PRN Agree with holding steroids.  - Noted plan for discharge soon. Follow-up chest x-ray in 2 weeks as an outpatient to make sure the effusion is not back.   PCCM will be available as needed during this admission. Please call with questions  Chilton GreathousePraveen Akio Hudnall MD [facility] Pager [phone] If no answer or after 3pm call: [phone] 12/04/2017, 9:48 AM

## 2017-12-04 NOTE — Progress Notes (Signed)
Subjective: No new complaints Denies SOB, cough, or chest pain.  Wanting to go home today.   Antibiotics:  Anti-infectives (From admission, onward)   Start     Dose/Rate Route Frequency Ordered Stop   11/29/17 1300  cefTRIAXone (ROCEPHIN) 1 g in dextrose 5 % 50 mL IVPB  Status:  Discontinued     1 g 100 mL/hr over 30 Minutes Intravenous Every 24 hours 11/29/17 1259 11/30/17 1139   11/27/17 1400  isoniazid (NYDRAZID) tablet 300 mg     300 mg Oral Daily 11/27/17 1302     11/27/17 1400  rifampin (RIFADIN) capsule 600 mg     600 mg Oral Daily 11/27/17 1302     11/27/17 1400  pyrazinamide tablet 2,000 mg     2,000 mg Oral Daily 11/27/17 1302     11/27/17 1400  ethambutol (MYAMBUTOL) tablet 1,600 mg     1,600 mg Oral Daily 11/27/17 1302     11/25/17 1400  azithromycin (ZITHROMAX) tablet 500 mg  Status:  Discontinued     500 mg Oral Daily 11/25/17 1314 11/27/17 1302   11/23/17 1200  cefTRIAXone (ROCEPHIN) 1 g in dextrose 5 % 50 mL IVPB  Status:  Discontinued     1 g 100 mL/hr over 30 Minutes Intravenous Daily 11/23/17 1113 11/27/17 1302   11/23/17 1115  azithromycin (ZITHROMAX) 500 mg in dextrose 5 % 250 mL IVPB  Status:  Discontinued     500 mg 250 mL/hr over 60 Minutes Intravenous Every 24 hours 11/23/17 1042 11/25/17 1313   11/18/17 1500  levofloxacin (LEVAQUIN) tablet 500 mg  Status:  Discontinued     500 mg Oral Daily 11/17/17 1550 11/22/17 1406   11/16/17 1530  levofloxacin (LEVAQUIN) IVPB 750 mg  Status:  Discontinued     750 mg 100 mL/hr over 90 Minutes Intravenous Every 24 hours 11/16/17 1520 11/17/17 1550   11/16/17 1400  levofloxacin (LEVAQUIN) IVPB 750 mg     750 mg 100 mL/hr over 90 Minutes Intravenous  Once 11/16/17 1357 11/16/17 1616     Medications: Scheduled Meds: . aspirin EC  81 mg Oral Daily  . diltiazem  120 mg Oral BID  . enoxaparin (LOVENOX) injection  40 mg Subcutaneous Q24H  . ethambutol  1,600 mg Oral Daily  . ibuprofen  600 mg Oral TID  .  isoniazid  300 mg Oral Daily  . metoprolol tartrate  100 mg Oral BID  . pyrazinamide  2,000 mg Oral Daily  . vitamin B-6  50 mg Oral Daily  . rifampin  600 mg Oral Daily  . sodium chloride flush  3 mL Intravenous Q12H   Continuous Infusions: . sodium chloride     PRN Meds:.sodium chloride, acetaminophen **OR** acetaminophen, hydrALAZINE, lidocaine, menthol-cetylpyridinium, morphine injection, ondansetron **OR** ondansetron (ZOFRAN) IV, oxyCODONE, sodium chloride flush  Objective: Weight change: -3.2 oz (-0.544 kg)  Intake/Output Summary (Last 24 hours) at 12/04/2017 0853 Last data filed at 12/04/2017 0418 Gross per 24 hour  Intake 360 ml  Output 1050 ml  Net -690 ml   Blood pressure 125/88, pulse 77, temperature 98.8 F (37.1 C), temperature source Oral, resp. rate 20, height 5\' 8"  (1.727 m), weight 194 lb 8 oz (88.2 kg), SpO2 99 %. Temp:  [98.8 F (37.1 C)-99.8 F (37.7 C)] 98.8 F (37.1 C) (02/08 0416) Pulse Rate:  [77-88] 77 (02/08 0416) Resp:  [20] 20 (02/08 0416) BP: (118-125)/(78-88) 125/88 (02/08 0416) SpO2:  [98 %-99 %]  99 % (02/08 0416) Weight:  [194 lb 8 oz (88.2 kg)] 194 lb 8 oz (88.2 kg) (02/08 0416)  Physical Exam: General: Alert and awake, oriented x3, not in any acute distress. HEENT: Anicteric sclera, pupils reactive to light and accommodation, EOMI CVS: Regular rate, normal r, no murmur rubs or gallops Chest: Stable compared to prior exam  Abdomen: Soft nontender, nondistended, normal bowel sounds, Extremities: No clubbing or edema noted bilaterally Skin: No rashes Lymph: No new lymphadenopathy Neuro: Nonfocal  CBC: @LABBLAST3 (wbc3,Hgb:3,Hct:3,Plt:3,INR:3APTT:3)@  BMET Recent Labs    12/02/17 0755 12/03/17 0503  NA 137 139  K 4.0 4.1  CL 102 104  CO2 23 22  GLUCOSE 100* 114*  BUN 14 15  CREATININE 0.97 0.95  CALCIUM 8.5* 8.2*   Liver Panel  No results for input(s): PROT, ALBUMIN, AST, ALT, ALKPHOS, BILITOT, BILIDIR, IBILI in the last 72  hours.  Sedimentation Rate No results for input(s): ESRSEDRATE in the last 72 hours. C-Reactive Protein No results for input(s): CRP in the last 72 hours.  Micro Results: Recent Results (from the past 720 hour(s))  Culture, blood (routine x 2)     Status: None   Collection Time: 11/16/17  1:25 PM  Result Value Ref Range Status   Specimen Description BLOOD RIGHT ANTECUBITAL  Final   Special Requests   Final    BOTTLES DRAWN AEROBIC AND ANAEROBIC Blood Culture adequate volume   Culture NO GROWTH 5 DAYS  Final   Report Status 11/21/2017 FINAL  Final  Culture, blood (routine x 2)     Status: None   Collection Time: 11/16/17  3:09 PM  Result Value Ref Range Status   Specimen Description BLOOD LEFT ANTECUBITAL  Final   Special Requests   Final    BOTTLES DRAWN AEROBIC AND ANAEROBIC Blood Culture adequate volume   Culture NO GROWTH 5 DAYS  Final   Report Status 11/21/2017 FINAL  Final  Gram stain     Status: None   Collection Time: 11/18/17 12:48 PM  Result Value Ref Range Status   Specimen Description PLEURAL LEFT  Final   Special Requests NONE  Final   Gram Stain   Final    FEW WBC PRESENT, PREDOMINANTLY MONONUCLEAR NO ORGANISMS SEEN    Report Status 11/18/2017 FINAL  Final  Culture, body fluid-bottle     Status: None   Collection Time: 11/18/17 12:48 PM  Result Value Ref Range Status   Specimen Description PLEURAL LEFT  Final   Special Requests NONE  Final   Culture NO GROWTH 5 DAYS  Final   Report Status 11/23/2017 FINAL  Final  Culture, expectorated sputum-assessment     Status: None   Collection Time: 11/23/17 10:40 AM  Result Value Ref Range Status   Specimen Description SPUTUM  Final   Special Requests Normal  Final   Sputum evaluation   Final    Sputum specimen not acceptable for testing.  Please recollect.   RESULT CALLED TO, READ BACK BY AND VERIFIED WITH: Sheffield Slider RN 11/24/17 0446 JDW    Report Status 11/25/2017 FINAL  Final  Acid Fast Smear (AFB)     Status:  None   Collection Time: 11/23/17 10:40 AM  Result Value Ref Range Status   AFB Specimen Processing Concentration  Final   Acid Fast Smear Negative  Final    Comment: (NOTE) Performed At: [facility] [address] [place], [place] [id number] Jolene Schimke MD WG:[id number]    Source (AFB) SPUTUM  Final  Gram stain     Status: None   Collection Time: 11/24/17 12:29 AM  Result Value Ref Range Status   Specimen Description FLUID PLEURAL LEFT  Final   Special Requests NONE  Final   Gram Stain   Final    FEW WBC PRESENT, PREDOMINANTLY PMN NO ORGANISMS SEEN    Report Status 11/25/2017 FINAL  Final  Acid Fast Smear (AFB)     Status: None   Collection Time: 11/24/17 12:29 AM  Result Value Ref Range Status   AFB Specimen Processing Concentration  Final   Acid Fast Smear Negative  Final    Comment: (NOTE) Performed At: [facility] [address] [place], [place] [id number] Jolene Schimke MD WU:[id number]    Source (AFB) FLUID  Final    Comment: PLEURAL LEFT   Acid Fast Smear (AFB)     Status: None   Collection Time: 11/24/17  6:42 AM  Result Value Ref Range Status   AFB Specimen Processing Concentration  Final   Acid Fast Smear Negative  Final    Comment: (NOTE) Performed At: [facility] [address] [place], [place] [id number] Jolene Schimke MD VH:[id number]    Source (AFB) EXPECTORATED SPUTUM  Final  Culture, blood (routine x 2)     Status: None (Preliminary result)   Collection Time: 11/29/17  1:24 PM  Result Value Ref Range Status   Specimen Description BLOOD LEFT FOREARM  Final   Special Requests IN PEDIATRIC BOTTLE Blood Culture adequate volume  Final   Culture   Final    NO GROWTH 4 DAYS Performed at [facility], [address] [place], [place] [address]    Report Status PENDING  Incomplete  Culture, blood (routine x 2)     Status: None (Preliminary result)   Collection Time: 11/29/17  1:27 PM  Result Value Ref Range Status     Specimen Description BLOOD LEFT HAND  Final   Special Requests IN PEDIATRIC BOTTLE Blood Culture adequate volume  Final   Culture   Final    NO GROWTH 4 DAYS Performed at [facility], [address], [place], [place] [address]    Report Status PENDING  Incomplete  Gram stain     Status: None   Collection Time: 12/02/17 10:05 AM  Result Value Ref Range Status   Specimen Description FLUID PLEURAL LEFT  Final   Special Requests NONE  Final   Gram Stain   Final    RARE WBC PRESENT, PREDOMINANTLY PMN NO ORGANISMS SEEN Performed at [facility], [address] [place], [place] [address]    Report Status 12/02/2017 FINAL  Final  Acid Fast Smear (AFB)     Status: None   Collection Time: 12/02/17 10:05 AM  Result Value Ref Range Status   AFB Specimen Processing Concentration  Final   Acid Fast Smear Negative  Final    Comment: (NOTE) Performed At: [facility] [address] [place], [place] [id number] Jolene Schimke MD VZ:[id number]    Source (AFB) FLUID  Final    Comment: PLEURAL LEFT   Culture, body fluid-bottle     Status: None (Preliminary result)   Collection Time: 12/02/17 10:05 AM  Result Value Ref Range Status   Specimen Description FLUID PLEURAL LEFT  Final   Special Requests NONE  Final   Culture   Final    NO GROWTH 1 DAY Performed at [facility], [address] [place], [place] [address]  Report Status PENDING  Incomplete   Studies/Results: Dg Chest Port 1 View  Result Date: 12/02/2017 CLINICAL DATA:  Post left thoracentesis EXAM: PORTABLE CHEST 1 VIEW COMPARISON:  11/30/2017 FINDINGS: Cardiomegaly with vascular congestion. Decreasing left effusion following thoracentesis. No pneumothorax. Continued left lower lobe airspace opacity. Right lung clear. IMPRESSION: No pneumothorax following left thoracentesis. Left basilar atelectasis or infiltrate. Electronically Signed   By: Charlett NoseKevin  Dover M.D.   On: 12/02/2017 10:23   [place]  Thoracentesis Asp Pleural Space W/img Guide  Result Date: 12/02/2017 INDICATION: Patient with left pleural effusion. Request is made for diagnostic and therapeutic thoracentesis. EXAM: ULTRASOUND GUIDED DIAGNOSTIC AND THERAPEUTIC LEFT THORACENTESIS MEDICATIONS: 10 mL 1% lidocaine COMPLICATIONS: None immediate. PROCEDURE: An ultrasound guided thoracentesis was thoroughly discussed with the patient and questions answered. The benefits, risks, alternatives and complications were also discussed. The patient understands and wishes to proceed with the procedure. Written consent was obtained. Ultrasound was performed to localize and mark an adequate pocket of fluid in the left chest. The area was then prepped and draped in the normal sterile fashion. 1% Lidocaine was used for local anesthesia. Under ultrasound guidance a Safe-T-Centesis catheter was introduced. Thoracentesis was performed. The catheter was removed and a dressing applied. FINDINGS: A total of approximately 600 mL of yellow fluid was removed. Samples were sent to the laboratory as requested by the clinical team. IMPRESSION: Successful ultrasound guided diagnostic and therapeutic left thoracentesis yielding 600 mL of pleural fluid. Read by: Loyce DysKacie Matthews PA-C Electronically Signed   By: Irish LackGlenn  Yamagata M.D.   On: 12/02/2017 10:35   Assessment/Plan:  INTERVAL HISTORY:  - Remains afebrile and hemodynamically stable  - No oxygen requirement  - MTB PCR still pending  - CXR today, reviewed, reported as "No sizable effusion is noted." - Pleural fluid cultures showing no growth to date  Principal Problem:   PNA (pneumonia) Active Problems:   Respiratory failure (HCC)   Hypertension   Pleural effusion   S/P thoracentesis   Abnormal transaminases   Extrapulmonary tuberculosis   Chest pain  Scott Devoidbdul Yusufis a 46 y.o.maleor[place] from [place] who presented with 3 weeks of progressive dyspnea, cough, and pleuritic chest pain. CT chest on  admission illustrated a L>R pleural effusion with compressive atelectasis vs infiltration with nonspecific enlarged lymph nodes.Pleural fluid analysis x3 indicating an exudative processwith unclear etiology at this point.   - Continue RIPE  - Contact health department prior to discharge to ensure patient gets his TB medications [facility]([place], [phone]) - Follow-up MTB PCR, I called [facility] and they are setting up the PCR today. Results should be available over the weekend.  - Follow-up with ID clinic in 7-14 days   LOS: 18 days   [facility] Surgery CenterJustin Kenedee Rowe 12/04/2017, 8:53 AM

## 2017-12-04 NOTE — Plan of Care (Signed)
  Progressing Health Behavior/Discharge Planning: Ability to manage health-related needs will improve 12/04/2017 0124 - Progressing by Elnita Maxwellodoo, Jayd Cadieux A, RN Clinical Measurements: Ability to maintain clinical measurements within normal limits will improve 12/04/2017 0124 - Progressing by Elnita Maxwellodoo, Adea Geisel A, RN Will remain free from infection 12/04/2017 0124 - Progressing by Elnita Maxwellodoo, Burma Ketcher A, RN Diagnostic test results will improve 12/04/2017 0124 - Progressing by Elnita Maxwellodoo, Emylie Amster A, RN Respiratory complications will improve 12/04/2017 0124 - Progressing by Elnita Maxwellodoo, Nicandro Perrault A, RN Cardiovascular complication will be avoided 12/04/2017 0124 - Progressing by Elnita Maxwellodoo, Ambriella Kitt A, RN

## 2017-12-04 NOTE — Discharge Instructions (Signed)
°  1)Take all your medications including TB (tuberculosis) medications as prescribed 2)Follow-up with infectious disease specialist Dr. Daiva EvesVan Dam in 2 weeks 3)You need repeat chest x-ray with infectious disease specialist in 2 weeks 4)Call or return sooner if fevers, chills, night sweats, increasing shortness of breath, worsening cough or chest pains 5)You have an irregular heartbeat called atrial fibrillation-you need to take Cardizem and metoprolol as well as baby aspirin as prescribed 6)Contact health department  to ensure patient you get your TB medications [address]([place], [phone]) 7)Follow-up with pulmonologist Dr. Marchelle Gearingamaswamy in 1 week

## 2017-12-07 LAB — CULTURE, BODY FLUID-BOTTLE

## 2017-12-07 LAB — CULTURE, BODY FLUID W GRAM STAIN -BOTTLE: Culture: NO GROWTH

## 2017-12-08 LAB — MTB NAA WITHOUT AFB CULTURE: M Tuberculosis, Naa: NEGATIVE

## 2017-12-22 ENCOUNTER — Encounter: Payer: Self-pay | Admitting: Adult Health

## 2017-12-22 ENCOUNTER — Ambulatory Visit (INDEPENDENT_AMBULATORY_CARE_PROVIDER_SITE_OTHER): Payer: Self-pay | Admitting: Adult Health

## 2017-12-22 ENCOUNTER — Ambulatory Visit (INDEPENDENT_AMBULATORY_CARE_PROVIDER_SITE_OTHER)
Admission: RE | Admit: 2017-12-22 | Discharge: 2017-12-22 | Disposition: A | Discharge status: Home / Self Care | Payer: Self-pay | Source: Ambulatory Visit | Attending: Adult Health | Admitting: Adult Health

## 2017-12-22 ENCOUNTER — Telehealth: Payer: Self-pay | Admitting: *Deleted

## 2017-12-22 VITALS — BP 144/82 | HR 78 | Ht 68.0 in | Wt 203.0 lb

## 2017-12-22 DIAGNOSIS — J189 Pneumonia, unspecified organism: Secondary | ICD-10-CM

## 2017-12-22 DIAGNOSIS — J90 Pleural effusion, not elsewhere classified: Secondary | ICD-10-CM

## 2017-12-22 DIAGNOSIS — A1889 Tuberculosis of other sites: Secondary | ICD-10-CM

## 2017-12-22 NOTE — Telephone Encounter (Signed)
[facility] triage contacted by [facility] regarding hospital follow up. RN spoke with Dr Daiva EvesVan Dam, ok to schedule for when his cultures are final (6 weeks). Scheduled for 3/27. Notified Pulm, left voicemail for patient. Front desk will send appointment letter. Andree CossMichelle M Perez Dirico, RN

## 2017-12-22 NOTE — Patient Instructions (Signed)
Refer to ID clinic  Follow up with pulmonary As needed

## 2017-12-22 NOTE — Assessment & Plan Note (Signed)
Cont on Anti-TB regimen  Follow up with ID

## 2017-12-22 NOTE — Progress Notes (Signed)
@Patient  ID: Scott Rowe, male    DOB: 1971-01-13, 47 y.o.   MRN: [id number]  Chief Complaint  Patient presents with  . Follow-up    Pleural Effusion     Referring provider: No ref. provider found  HPI: 47 year old male from [place] never smoker seen for [facility] consult during hospitalization November 19, 2017 for exudative recurrent left pleural effusion  TEST  CT chest November 16, 2017 showed large left pleural effusion with significant atelectasis of the left lung, small right pleural effusion. CT chest November 18, 2017 showed significantly decreased size of left pleural effusion with multifocal groundglass opacities throughout the lingula and left lower lobe. 1/23 - pleural fluid - exudative with LDH 237 , protien  4.8 and glucose not avail. Mesothelial cells +, 69% lymphs CT chest November 23, 2017 showed moderate sized left pleural effusion with adjacent compression atelectasis 1/29  - pleural efluid with Gluc 122, LDH 130, and 69% polys and mesothelia cells +. AFB smear negative. 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 RF weakly positive 15.8  - ANCAs negative - ANA negative  Pleural fluid gram stain and cultures negative  - HIV negative - AFB x 3 negative  - ADA 3.4 (High sensitivity making TB as the cause less likley)  - Crypto antigen negative  - No eosinophils noted (making parasitic infection unlikely) Cytology negative (does not completely rule out malignancy) - Total protein <5 making paraproteinemia unlikely  - Triglycerides 31 (ruling out chylothorax) Amylase 35 and pH 7.5 making esophageal rupture or perforation unlikely   -ACE 20  -Histo ag -neg <0.5    12/22/2017 Follow up : Recurrent Pleural Effusion  Patient returns for a post hospital follow-up.  Patient had a prolonged hospitalization for recurrent large left pleural effusion.  Patient is a [place] male who has been  in the [place] for greater than 20 years.  He does travel to [place] usually once a year.  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  Patient presented to the emergency room last month with a 3-week history of progressive dyspnea cough and pleuritic pain.  CT chest showed a very large left greater than right pleural effusion with compressive atelectasis.  Pleural analysis was positive for an exudative process.  Patient had extensive evaluation with pulmonary, infectious disease and thoracic surgery.  Autoimmune workup was essentially negative with only a weakly positive rheumatoid factor.  HIV was negative pleural gram stain and cultures were negative AFB was negative x3 crypto antigen was negative.  Pathology was negative.  Histo antigen and ACE level were negative. She was treated for a parapneumonic pleural effusion. Patient had an initial thoracentesis was large amount of pleural fluid removed.  A follow-up CT chest showed a decreased size of left pleural effusion with multifocal groundglass opacities throughout the lingula and left lower lobe.  He was treated with aggressive IV antibiotics.  Patient continued to have recurrent effusion and underwent a repeat thoracentesis on January 29 and February 6. He was seen by infectious disease with clinical suspicion for extrapulmonary TB - tuberculosis Effusion  and started on RIPE therapy .  He was set up with Health Department and was placed on their service . He has been on home isolation since discharge with daily visits for medication therapy . He was cleared last week from home isolation . He says he  is tolerating therapy okay . He is frustrated that he was kept at home but followed the directions and has returned back to work .  He is eating well with no n/v.d.  No cough , congestion or hemopytsis .  Chest xray today reviewed indpendently and shows resolved effusions w/ no acute process.       No Known  Allergies  Immunization History  Administered Date(s) Administered  . PPD Test 11/24/2017    Past Medical History:  Diagnosis Date  . Hypertension     Tobacco History: Social History   Tobacco Use  Smoking Status Never Smoker  Smokeless Tobacco Never Used   Counseling given: Not Answered   Outpatient Encounter Medications as of 12/22/2017  Medication Sig  . acetaminophen (TYLENOL) 500 MG tablet Take 500 mg by mouth every 6 (six) hours as needed for mild pain.  Marland Kitchen aspirin EC 81 MG EC tablet Take 1 tablet (81 mg total) by mouth daily. With food  . diltiazem (CARDIZEM CD) 120 MG 24 hr capsule Take 1 capsule (120 mg total) by mouth daily.  Marland Kitchen ethambutol (MYAMBUTOL) 400 MG tablet Take 4 tablets (1,600 mg total) by mouth daily.  Marland Kitchen ibuprofen (MOTRIN IB) 200 MG tablet Take 2 tablets (400 mg total) by mouth every 6 (six) hours as needed for fever, headache or moderate pain. With food  . isoniazid (NYDRAZID) 300 MG tablet Take 1 tablet (300 mg total) by mouth daily.  . metoprolol tartrate (LOPRESSOR) 100 MG tablet Take 1 tablet (100 mg total) by mouth 2 (two) times daily.  Marland Kitchen oxyCODONE (OXY IR/ROXICODONE) 5 MG immediate release tablet Take 1 tablet (5 mg total) by mouth every 6 (six) hours as needed for moderate pain or severe pain.  . pyrazinamide 500 MG tablet Take 4 tablets (2,000 mg total) by mouth daily.  Marland Kitchen pyridOXINE (B-6) 50 MG tablet Take 1 tablet (50 mg total) by mouth daily.  . rifampin (RIFADIN) 300 MG capsule Take 2 capsules (600 mg total) by mouth daily.  . vitamin C (ASCORBIC ACID) 500 MG tablet Take 1 tablet (500 mg total) by mouth 2 (two) times daily.  . Aspirin-Acetaminophen-Caffeine (GOODY HEADACHE PO) Take 1 packet by mouth every 6 (six) hours as needed (pain).   No facility-administered encounter medications on file as of 12/22/2017.      Review of Systems  Constitutional:   No  weight loss, night sweats,  Fevers, chills, fatigue, or  lassitude.  HEENT:   No  headaches,  Difficulty swallowing,  Tooth/dental problems, or  Sore throat,                No sneezing, itching, ear ache, nasal congestion, post nasal drip,   CV:  No chest pain,  Orthopnea, PND, swelling in lower extremities, anasarca, dizziness, palpitations, syncope.   GI  No heartburn, indigestion, abdominal pain, nausea, vomiting, diarrhea, change in bowel habits, loss of appetite, bloody stools.   Resp: No shortness of breath with exertion or at rest.  No excess mucus, no productive cough,  No non-productive cough,  No coughing up of blood.  No change in color of mucus.  No wheezing.  No chest wall deformity  Skin: no rash or lesions.  GU: no dysuria, change in color of urine, no urgency or frequency.  No flank pain, no hematuria   MS:  No joint pain or swelling.  No decreased range of motion.  No back pain.    Physical Exam  BP (!) 144/82 (BP  Location: Left Arm, Cuff Size: Normal)   Pulse 78   Ht 5\' 8"  (1.727 m)   Wt 203 lb (92.1 kg)   SpO2 99%   BMI 30.87 kg/m   GEN: A/Ox3; pleasant , NAD, well nourished    HEENT:  [place]/AT,  EACs-clear, TMs-wnl, NOSE-clear, THROAT-clear, no lesions, no postnasal drip or exudate noted.   NECK:  Supple w/ fair ROM; no JVD; normal carotid impulses w/o bruits; no thyromegaly or nodules palpated; no lymphadenopathy.    RESP  Clear  P & A; w/o, wheezes/ rales/ or rhonchi. no accessory muscle use, no dullness to percussion  CARD:  RRR, no m/r/g, no peripheral edema, pulses intact, no cyanosis or clubbing.  GI:   Soft & nt; nml bowel sounds; no organomegaly or masses detected.   Musco: Warm bil, no deformities or joint swelling noted.   Neuro: alert, no focal deficits noted.    Skin: Warm, no lesions or rashes    Lab Results:  CBC    Component Value Date/Time   WBC 12.7 (H) 12/03/2017 0503   RBC 4.42 12/03/2017 0503   HGB 10.9 (L) 12/03/2017 0503   HCT 34.3 (L) 12/03/2017 0503   PLT 407 (H) 12/03/2017 0503   MCV 77.6 (L)  12/03/2017 0503   MCH 24.7 (L) 12/03/2017 0503   MCHC 31.8 12/03/2017 0503   RDW 13.5 12/03/2017 0503    BMET    Component Value Date/Time   NA 139 12/03/2017 0503   K 4.1 12/03/2017 0503   CL 104 12/03/2017 0503   CO2 22 12/03/2017 0503   GLUCOSE 114 (H) 12/03/2017 0503   BUN 15 12/03/2017 0503   CREATININE 0.95 12/03/2017 0503   CALCIUM 8.2 (L) 12/03/2017 0503   GFRNONAA >60 12/03/2017 0503   GFRAA >60 12/03/2017 0503    BNP    Component Value Date/Time   BNP 99.3 11/16/2017 1509    ProBNP No results found for: PROBNP  Imaging: Dg Chest 1 View  Result Date: 11/24/2017 CLINICAL DATA:  47 year old male with a history of recent left-sided thoracentesis EXAM: CHEST 1 VIEW COMPARISON:  Chest x-ray 11/23/2017, CT 11/23/2017 FINDINGS: Cardiomediastinal silhouette unchanged. Improved aeration at the left base. Blunting of left costophrenic angle. No pneumothorax. No confluent airspace disease. IMPRESSION: Improved aeration at the left base with decreased pleural effusion and no evidence of pneumothorax. Electronically Signed   By: Gilmer Mor D.O.   On: 11/24/2017 14:30   Dg Chest 2 View  Result Date: 12/22/2017 CLINICAL DATA:  Positive tuberculin skin test.  Hypertension. EXAM: CHEST  2 VIEW COMPARISON:  December 04, 2017 FINDINGS: There is no edema or consolidation. Heart is mildly enlarged with pulmonary vascularity within normal limits. No adenopathy. No bone lesions. IMPRESSION: Mild cardiomegaly. No edema or consolidation. No evident adenopathy. Electronically Signed   By: Bretta Bang III M.D.   On: 12/22/2017 12:32   Dg Chest 2 View  Result Date: 11/30/2017 CLINICAL DATA:  Hypertension, asthma, dyspnea, chest pain, productive cough, BILATERAL pleural effusions post LEFT thoracentesis EXAM: CHEST  2 VIEW COMPARISON:  11/28/2017 FINDINGS: Enlargement of cardiac silhouette. Mediastinal contours and pulmonary vascularity normal. Persistent LEFT pleural effusion and basilar  opacity question atelectasis versus consolidation. Tiny RIGHT pleural effusion. Remaining lungs clear. No additional infiltrate or pneumothorax. Bones unremarkable. IMPRESSION: Persistent LEFT pleural effusion with atelectasis versus consolidation at LEFT base. Enlargement of cardiac silhouette with tiny RIGHT pleural effusion. Electronically Signed   By: Ulyses Southward M.D.   On: 11/30/2017  13:35   Dg Chest 2 View  Result Date: 11/26/2017 CLINICAL DATA:  Pleural effusions.  Recent left thoracentesis. EXAM: CHEST  2 VIEW COMPARISON:  11/25/2017 FINDINGS: The heart size and mediastinal contours are within normal limits. Small left pleural effusion shows no significant change in size. There is persistent atelectasis or consolidation in the left lower lobe. No evidence of pneumothorax. Right lung is clear. IMPRESSION: No significant change in small left pleural effusion and left lower lobe atelectasis versus consolidation. Electronically Signed   By: Myles Rosenthal M.D.   On: 11/26/2017 09:05   Ct Chest W Contrast  Result Date: 11/23/2017 CLINICAL DATA:  Pleural effusion follow up EXAM: CT CHEST WITH CONTRAST TECHNIQUE: Multidetector CT imaging of the chest was performed during intravenous contrast administration. CONTRAST:  75 cc ISOVUE-300 IOPAMIDOL (ISOVUE-300) INJECTION 61% COMPARISON:  CT chest dated 11/18/2017. chest CT dated 11/16/2017. FINDINGS: Cardiovascular: No thoracic aortic aneurysm or evidence of aortic dissection. Cardiomegaly. Small pericardial effusion, similar to previous exam. Mediastinum/Nodes: Stable mildly prominent lymph node within the right lower paratracheal space, measuring 12 mm short axis dimension. Additional stable mildly prominent lymph node within the subcarinal space, also measuring approximately 12 mm short axis dimension. Additional scattered smaller lymph nodes within the mediastinum. No new lymphadenopathy. Esophagus appears normal.  Trachea appears normal. Lungs/Pleura:  Moderate-sized left pleural effusion, with adjacent compressive atelectasis, increased compared to the chest CT of 11/18/2017, less than the pleural effusion that was seen on 11/16/2017. Small right pleural effusion with adjacent compressive atelectasis. Lungs otherwise clear.  No pneumothorax. Upper Abdomen: No acute findings within the upper abdomen. Musculoskeletal: No acute or suspicious osseous finding. IMPRESSION: 1. Moderate-sized left pleural effusion, with adjacent compressive atelectasis. The pleural effusion has reaccumulated since chest CT of 11/18/2017 but is not as large as the pleural effusion seen on earlier chest CT of 11/16/2017. 2. Small right pleural effusion, similar to previous exams. 3. Small pericardial effusion, similar to previous exam. Stable cardiomegaly. 4. Stable mild lymphadenopathy within the mediastinum, as detailed above. Electronically Signed   By: Bary Richard M.D.   On: 11/23/2017 23:38   Dg Chest Port 1 View  Result Date: 12/04/2017 CLINICAL DATA:  Shortness of breath and respiratory failure EXAM: PORTABLE CHEST 1 VIEW COMPARISON:  12/02/2017 FINDINGS: Cardiac shadow is stable. The lungs are well aerated bilaterally. Very minimal left basilar atelectasis is seen. No sizable effusion is noted. No bony abnormality is seen. IMPRESSION: Minimal left basilar atelectasis. Electronically Signed   By: Alcide Clever M.D.   On: 12/04/2017 09:21   Dg Chest Port 1 View  Result Date: 12/02/2017 CLINICAL DATA:  Post left thoracentesis EXAM: PORTABLE CHEST 1 VIEW COMPARISON:  11/30/2017 FINDINGS: Cardiomegaly with vascular congestion. Decreasing left effusion following thoracentesis. No pneumothorax. Continued left lower lobe airspace opacity. Right lung clear. IMPRESSION: No pneumothorax following left thoracentesis. Left basilar atelectasis or infiltrate. Electronically Signed   By: Charlett Nose M.D.   On: 12/02/2017 10:23   Dg Chest Port 1 View  Result Date: 11/28/2017 CLINICAL  DATA:  Dyspnea. EXAM: PORTABLE CHEST 1 VIEW COMPARISON:  11/26/2017 FINDINGS: Cardiomediastinal silhouette is enlarged. There is persistent left lower lobe airspace consolidation with moderate in size left pleural effusion. Small right subpulmonic pleural effusion cannot be excluded. No evidence of pneumothorax. Osseous structures are without acute abnormality. Soft tissues are grossly normal. IMPRESSION: Persistent left lower lobe airspace consolidation and moderate in size left pleural effusion. Probable small subpulmonic right pleural effusion. Enlarged cardiac silhouette. Electronically Signed  By: Ted Mcalpine M.D.   On: 11/28/2017 09:35   Dg Chest Port 1 View  Result Date: 11/25/2017 CLINICAL DATA:  Left-sided thoracentesis yesterday EXAM: PORTABLE CHEST 1 VIEW COMPARISON:  11/24/2017 FINDINGS: Slight interval increase in left base atelectasis with probable small left effusion. Right lung remains clear. No evidence for pneumothorax The cardio pericardial silhouette is enlarged. The visualized bony structures of the thorax are intact. Telemetry leads overlie the chest. IMPRESSION: Slight increase in left base atelectasis/effusion. Electronically Signed   By: Kennith Center M.D.   On: 11/25/2017 09:59   Dg Chest Port 1 View  Result Date: 11/23/2017 CLINICAL DATA:  47 year old male with shortness of breath. Status post ultrasound-guided thoracentesis on 11/18/2017 large left pleural effusion. Negative cytology and cultures of the left pleural fluid. EXAM: PORTABLE CHEST 1 VIEW COMPARISON:  11/21/2017 FINDINGS: Portable AP semi upright view at 0642 hrs. Stable lung volumes. Stable mediastinal contours. Visualized tracheal air column is within normal limits. No pneumothorax. Increased dense left lung base opacity since the post thoracentesis image on 11/18/2017. Stable obscuration of the left hemidiaphragm since 11/21/2017. The right lung remains clear. Paucity of bowel gas in the upper abdomen.  IMPRESSION: 1. Increasing opacity at the left lung base since 11/18/2017 could reflect recurrent left pleural effusion and/or atelectasis versus consolidation. 2. Negative right lung. Electronically Signed   By: Odessa Fleming M.D.   On: 11/23/2017 07:47   US Abdomen Limited Ruq  Result Date: 11/26/2017 CLINICAL DATA:  Elevated LFTs EXAM: ULTRASOUND ABDOMEN LIMITED RIGHT UPPER QUADRANT COMPARISON:  None. FINDINGS: Gallbladder: No gallstones or wall thickening visualized. No sonographic Murphy sign noted by sonographer. Common bile duct: Diameter: 4.3 mm Liver: No focal lesion identified. Within normal limits in parenchymal echogenicity. Portal vein is patent on color Doppler imaging with normal direction of blood flow towards the liver. IMPRESSION: Normal study.  No cause for elevated LFTs identified. Electronically Signed   By: Gerome Sam III M.D   On: 11/26/2017 20:18   Ir Thoracentesis Asp Pleural Space W/img Guide  Result Date: 11/24/2017 INDICATION: Patient with concern for TB, now with recurrent left pleural effusion. Request is made for diagnostic and therapeutic thoracentesis. EXAM: ULTRASOUND GUIDED DIAGNOSTIC AND THERAPEUTIC THORACENTESIS MEDICATIONS: 10 mL 2% lidocaine COMPLICATIONS: None immediate. PROCEDURE: An ultrasound guided thoracentesis was thoroughly discussed with the patient and questions answered. The benefits, risks, alternatives and complications were also discussed. The patient understands and wishes to proceed with the procedure. Written consent was obtained. Ultrasound was performed to localize and mark an adequate pocket of fluid in the left chest. The area was then prepped and draped in the normal sterile fashion. 2% Lidocaine was used for local anesthesia. Under ultrasound guidance a Safe-T-Centesis catheter was introduced. Thoracentesis was performed. The catheter was removed and a dressing applied. FINDINGS: A total of approximately 800 mL of amber fluid was removed. Samples  were sent to the laboratory as requested by the clinical team. IMPRESSION: Successful ultrasound guided diagnostic and therapeutic left thoracentesis yielding 800 mL of pleural fluid. Read by: Loyce Dys PA-C Electronically Signed   By: Simonne Come M.D.   On: 11/24/2017 16:14   US Thoracentesis Asp Pleural Space W/img Guide  Result Date: 12/02/2017 INDICATION: Patient with left pleural effusion. Request is made for diagnostic and therapeutic thoracentesis. EXAM: ULTRASOUND GUIDED DIAGNOSTIC AND THERAPEUTIC LEFT THORACENTESIS MEDICATIONS: 10 mL 1% lidocaine COMPLICATIONS: None immediate. PROCEDURE: An ultrasound guided thoracentesis was thoroughly discussed with the patient and questions answered. The benefits, risks,  alternatives and complications were also discussed. The patient understands and wishes to proceed with the procedure. Written consent was obtained. Ultrasound was performed to localize and mark an adequate pocket of fluid in the left chest. The area was then prepped and draped in the normal sterile fashion. 1% Lidocaine was used for local anesthesia. Under ultrasound guidance a Safe-T-Centesis catheter was introduced. Thoracentesis was performed. The catheter was removed and a dressing applied. FINDINGS: A total of approximately 600 mL of yellow fluid was removed. Samples were sent to the laboratory as requested by the clinical team. IMPRESSION: Successful ultrasound guided diagnostic and therapeutic left thoracentesis yielding 600 mL of pleural fluid. Read by: Loyce DysKacie Matthews PA-C Electronically Signed   By: Irish LackGlenn  Yamagata M.D.   On: 12/02/2017 10:35     Assessment & Plan:   Pleural effusion Large Recurrent Left Pleural Effusion in patient with previous history of latent TB that [place] , positive PPD that was treated for a possible para-pneumonic effusion vs extrapulmonary TB -TB effusion .  He was treated with recurrent thoracentesis along with IV abx .  Workup has been unrevealing with neg AFB cultures and quantiferon gold testing . However was seen by ID with strong clinical suspicion of extrapulmonary TB /TB Effusion started on 4 drug anti TB therapy . He is being followed by health dept closely .  He is doing well now with no recurrence of  Effusion .  Will need to get follow up with ID .  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

## 2017-12-22 NOTE — Assessment & Plan Note (Signed)
Large Recurrent Left Pleural Effusion in patient with previous history of latent TB that was [place] , positive PPD that was treated for a possible para-pneumonic effusion vs extrapulmonary TB -TB effusion .  He was treated with recurrent thoracentesis along with IV abx . Workup has been unrevealing with neg AFB cultures and quantiferon gold testing . However was seen by ID with strong clinical suspicion of extrapulmonary TB /TB Effusion started on 4 drug anti TB therapy . He is being followed by health dept closely .  He is doing well now with no recurrence of  Effusion .  Will need to get follow up with ID .  Follow up with pulmonary As needed    Plan  Patient Instructions  Refer to ID clinic  Follow up with pulmonary As needed

## 2017-12-30 LAB — FUNGUS CULTURE WITH STAIN

## 2017-12-30 LAB — FUNGAL ORGANISM REFLEX

## 2017-12-30 LAB — FUNGUS CULTURE RESULT

## 2018-01-07 LAB — ACID FAST CULTURE WITH REFLEXED SENSITIVITIES (MYCOBACTERIA): Acid Fast Culture: NEGATIVE

## 2018-01-07 LAB — ACID FAST CULTURE WITH REFLEXED SENSITIVITIES: ACID FAST CULTURE - AFSCU3: NEGATIVE

## 2018-01-14 LAB — ACID FAST CULTURE WITH REFLEXED SENSITIVITIES (MYCOBACTERIA)

## 2018-01-14 LAB — ACID FAST CULTURE WITH REFLEXED SENSITIVITIES: ACID FAST CULTURE - AFSCU3: NEGATIVE

## 2018-01-20 ENCOUNTER — Ambulatory Visit (INDEPENDENT_AMBULATORY_CARE_PROVIDER_SITE_OTHER): Payer: Self-pay | Admitting: Infectious Disease

## 2018-01-20 ENCOUNTER — Ambulatory Visit (HOSPITAL_COMMUNITY)
Admission: RE | Admit: 2018-01-20 | Discharge: 2018-01-20 | Disposition: A | Discharge status: Home / Self Care | Payer: Self-pay | Source: Ambulatory Visit | Attending: Infectious Disease | Admitting: Infectious Disease

## 2018-01-20 VITALS — BP 151/100 | HR 72 | Temp 97.9°F | Resp 20 | Ht 68.0 in | Wt 203.0 lb

## 2018-01-20 DIAGNOSIS — J90 Pleural effusion, not elsewhere classified: Secondary | ICD-10-CM | POA: Insufficient documentation

## 2018-01-20 DIAGNOSIS — Z9889 Other specified postprocedural states: Secondary | ICD-10-CM

## 2018-01-20 DIAGNOSIS — A1889 Tuberculosis of other sites: Secondary | ICD-10-CM

## 2018-01-20 NOTE — Patient Instructions (Signed)
We schedule you with one of our NP's or one of my partner's next week  In the meantime we will get some blood work and Chest Xrays today

## 2018-01-20 NOTE — Progress Notes (Signed)
Subjective:    Chief complaint: recurrent chest pain, right shoulder pain, DOE, dyspnea tying his shoes   Patient ID: Scott Rowe, male    DOB: November 01, 1970, 47 y.o.   MRN: [id number]  HPI  Scott Rowe is originally from [place] who presented with 3 weeks of progressive dyspnea, cough, and pleuritic chest pain. CT chest on admission illustrated a L>R pleural effusion with compressive atelectasis vs infiltration with nonspecific enlarged lymph nodes.Pleural fluid analysis x3 indicating an exudative processwith unclear etiology   We ended up initiating RIPE which he continues to get via the [facility]. Scott Rowe had emailed me on Monday about him. His pleural fluid was remarkable for having normal ADA and all of his AFB cultures have Rowe negative though the [facility] sputa are still incubating.   He had Rowe improving and his CXR's had shown no evidence of recurrent effusion.  However this past Friday he again began to feel out of breath and was experiencing a fullness in his chest with DOE. He has orthopnea and worsening dyspnea now even tying his shoes brings on his dyspena. He has accompanying shoulder pain on right side. He says he feels "just like I did before, maybe about 60%, I am worried something is filling up inside of me again."  On exam he is nervous. He DOES have dullness to percussion on right side on my exam. No egophany.  Past Medical History:  Diagnosis Date  . Hypertension     Past Surgical History:  Procedure Laterality Date  . IR THORACENTESIS ASP PLEURAL SPACE W/IMG GUIDE  11/18/2017  . IR THORACENTESIS ASP PLEURAL SPACE W/IMG GUIDE  11/24/2017    No family history on file.    Social History   Socioeconomic History  . Marital status: Single    Spouse name: Not on file  . Number of children: Not on file  . Years of education: Not on file  . Highest education level: Not on file  Occupational History  . Not on file  Social Needs  . Financial resource strain: Not on file    . Food insecurity:    Worry: Not on file    Inability: Not on file  . Transportation needs:    Medical: Not on file    Non-medical: Not on file  Tobacco Use  . Smoking status: Never Smoker  . Smokeless tobacco: Never Used  Substance and Sexual Activity  . Alcohol use: No    Frequency: Never  . Drug use: No  . Sexual activity: Not on file  Lifestyle  . Physical activity:    Days per week: Not on file    Minutes per session: Not on file  . Stress: Not on file  Relationships  . Social connections:    Talks on phone: Not on file    Gets together: Not on file    Attends religious service: Not on file    Active member of club or organization: Not on file    Attends meetings of clubs or organizations: Not on file    Relationship status: Not on file  Other Topics Concern  . Not on file  Social History Narrative  . Not on file    No Known Allergies   Current Outpatient Medications:  .  acetaminophen (TYLENOL) 500 MG tablet, Take 500 mg by mouth every 6 (six) hours as needed for mild pain., Disp: , Rfl:  .  aspirin EC 81 MG EC tablet, Take 1 tablet (81 mg total) by  mouth daily. With food, Disp: 30 tablet, Rfl: 3 .  Aspirin-Acetaminophen-Caffeine (GOODY HEADACHE PO), Take 1 packet by mouth every 6 (six) hours as needed (pain)., Disp: , Rfl:  .  diltiazem (CARDIZEM CD) 120 MG 24 hr capsule, Take 1 capsule (120 mg total) by mouth daily., Disp: 30 capsule, Rfl: 3 .  ethambutol (MYAMBUTOL) 400 MG tablet, Take 4 tablets (1,600 mg total) by mouth daily., Disp: 120 tablet, Rfl: 3 .  ibuprofen (MOTRIN IB) 200 MG tablet, Take 2 tablets (400 mg total) by mouth every 6 (six) hours as needed for fever, headache or moderate pain. With food, Disp: 100 tablet, Rfl: 2 .  isoniazid (NYDRAZID) 300 MG tablet, Take 1 tablet (300 mg total) by mouth daily., Disp: 30 tablet, Rfl: 3 .  metoprolol tartrate (LOPRESSOR) 100 MG tablet, Take 1 tablet (100 mg total) by mouth 2 (two) times daily., Disp: 60  tablet, Rfl: 3 .  pyrazinamide 500 MG tablet, Take 4 tablets (2,000 mg total) by mouth daily., Disp: 120 tablet, Rfl: 3 .  pyridOXINE (B-6) 50 MG tablet, Take 1 tablet (50 mg total) by mouth daily., Disp: 30 tablet, Rfl: 3 .  rifampin (RIFADIN) 300 MG capsule, Take 2 capsules (600 mg total) by mouth daily., Disp: 60 capsule, Rfl: 3 .  vitamin C (ASCORBIC ACID) 500 MG tablet, Take 1 tablet (500 mg total) by mouth 2 (two) times daily., Disp: 60 tablet, Rfl: 3 .  oxyCODONE (OXY IR/ROXICODONE) 5 MG immediate release tablet, Take 1 tablet (5 mg total) by mouth every 6 (six) hours as needed for moderate pain or severe pain. (Patient not taking: Reported on 01/20/2018), Disp: 30 tablet, Rfl: 0   Review of Systems  Constitutional: Positive for activity change and fatigue. Negative for appetite change, chills, diaphoresis, fever and unexpected weight change.  HENT: Negative for congestion, rhinorrhea, sinus pressure, sneezing, sore throat and trouble swallowing.   Eyes: Negative for photophobia and visual disturbance.  Respiratory: Positive for cough, chest tightness and shortness of breath. Negative for wheezing and stridor.   Cardiovascular: Positive for chest pain. Negative for palpitations and leg swelling.  Gastrointestinal: Negative for abdominal distention, abdominal pain, anal bleeding, blood in stool, constipation, diarrhea, nausea and vomiting.  Genitourinary: Negative for difficulty urinating, dysuria, flank pain and hematuria.  Musculoskeletal: Negative for arthralgias, back pain, gait problem, joint swelling and myalgias.  Skin: Negative for color change, pallor, rash and wound.  Neurological: Negative for dizziness, tremors, weakness and light-headedness.  Hematological: Negative for adenopathy. Does not bruise/bleed easily.  Psychiatric/Behavioral: Negative for agitation, behavioral problems, confusion, decreased concentration, dysphoric mood and sleep disturbance.       Objective:    Physical Exam  Constitutional: He is oriented to person, place, and time. He appears well-developed and well-nourished.  HENT:  Head: Normocephalic and atraumatic.  Eyes: Conjunctivae and EOM are normal.  Neck: Normal range of motion. Neck supple.  Cardiovascular: Normal rate and regular rhythm. Exam reveals no gallop and no friction rub.  No murmur heard. Pulmonary/Chest: Effort normal. No respiratory distress. He has no wheezes. He has no rales.  Dullness to percussion on right side  Abdominal: Soft. He exhibits no distension.  Musculoskeletal: Normal range of motion. He exhibits no edema or tenderness.  Neurological: He is alert and oriented to person, place, and time.  Skin: Skin is warm and dry. No rash noted. No erythema. No pallor.          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 However clinically he has very worrisome findings for recurrence yet again  I will recheck labs including ESR, CRP and 2V CXR and lateral decubitus.  I am scheduling him next week with Janene Madeira on Tuesday for followup.  If his effusions are recurring then we clearly need a more invasive procedure to get a biopsy of pleura along with lymph nodes with specimens to pathology and also to micro for AFB, fungal stains and cultures, bacterial cultures  For now we will continue RIPE  I spent greater than 25 minutes with the patient including greater than 50% of time in face to face counsel of the patient and his son re workup of his exudative pleural effusions, reasons for empiric TB therapy but now likely need for CT surgery to arrive at a diagnosis. and in coordination of their care.

## 2018-01-21 ENCOUNTER — Encounter: Payer: Self-pay | Admitting: Infectious Disease

## 2018-01-21 LAB — COMPLETE METABOLIC PANEL WITH GFR
AG RATIO: 1.3 (calc) (ref 1.0–2.5)
ALBUMIN MSPROF: 4.2 g/dL (ref 3.6–5.1)
ALT: 13 U/L (ref 9–46)
AST: 12 U/L (ref 10–40)
Alkaline phosphatase (APISO): 93 U/L (ref 40–115)
BILIRUBIN TOTAL: 1 mg/dL (ref 0.2–1.2)
BUN: 14 mg/dL (ref 7–25)
CHLORIDE: 105 mmol/L (ref 98–110)
CO2: 25 mmol/L (ref 20–32)
Calcium: 9 mg/dL (ref 8.6–10.3)
Creat: 1.07 mg/dL (ref 0.60–1.35)
GFR, EST AFRICAN AMERICAN: 97 mL/min/{1.73_m2} (ref >=60)
GFR, Est Non African American: 83 mL/min/{1.73_m2} (ref >=60)
Globulin: 3.2 g/dL (calc) (ref 1.9–3.7)
Glucose, Bld: 87 mg/dL (ref 65–99)
POTASSIUM: 4.1 mmol/L (ref 3.5–5.3)
SODIUM: 141 mmol/L (ref 135–146)
TOTAL PROTEIN: 7.4 g/dL (ref 6.1–8.1)

## 2018-01-21 LAB — CBC WITH DIFFERENTIAL/PLATELET
BASOS ABS: 22 {cells}/uL (ref 0–200)
Basophils Relative: 0.2 %
EOS PCT: 0.4 %
Eosinophils Absolute: 44 cells/uL (ref 15–500)
HCT: 38.8 % (ref 38.5–50.0)
HEMOGLOBIN: 12.9 g/dL — AB (ref 13.2–17.1)
Lymphs Abs: 1886 cells/uL (ref 850–3900)
MCH: 25.7 pg — ABNORMAL LOW (ref 27.0–33.0)
MCHC: 33.2 g/dL (ref 32.0–36.0)
MCV: 77.4 fL — AB (ref 80.0–100.0)
MPV: 9.2 fL (ref 7.5–12.5)
Monocytes Relative: 12.2 %
NEUTROS ABS: 7619 {cells}/uL (ref 1500–7800)
Neutrophils Relative %: 69.9 %
Platelets: 267 10*3/uL (ref 140–400)
RBC: 5.01 10*6/uL (ref 4.20–5.80)
RDW: 16.8 % — ABNORMAL HIGH (ref 11.0–15.0)
Total Lymphocyte: 17.3 %
WBC: 10.9 10*3/uL — ABNORMAL HIGH (ref 3.8–10.8)
WBCMIX: 1330 {cells}/uL — AB (ref 200–950)

## 2018-01-21 LAB — C-REACTIVE PROTEIN: CRP: 182.7 mg/L — AB (ref <8.0)

## 2018-01-21 LAB — ACID FAST CULTURE WITH REFLEXED SENSITIVITIES (MYCOBACTERIA): Acid Fast Culture: NEGATIVE

## 2018-01-21 LAB — SEDIMENTATION RATE: SED RATE: 55 mm/h — AB (ref 0–15)

## 2018-01-25 ENCOUNTER — Encounter: Payer: Self-pay | Admitting: *Deleted

## 2018-01-25 ENCOUNTER — Telehealth: Payer: Self-pay | Admitting: *Deleted

## 2018-01-25 DIAGNOSIS — J90 Pleural effusion, not elsewhere classified: Secondary | ICD-10-CM

## 2018-01-25 NOTE — Telephone Encounter (Signed)
-----   Message from Randall Hissornelius N Van Dam, MD sent at 01/21/2018 10:10 AM EDT ----- Effusion has indeed returned on the right. I do think he needs a diagnostic intervention w [facility]

## 2018-01-25 NOTE — Telephone Encounter (Signed)
Error

## 2018-01-25 NOTE — Telephone Encounter (Signed)
Per TP: please refer patient to [facility].  Thanks.

## 2018-01-26 ENCOUNTER — Encounter: Payer: Self-pay | Admitting: Infectious Diseases

## 2018-01-26 ENCOUNTER — Ambulatory Visit
Admission: RE | Admit: 2018-01-26 | Discharge: 2018-01-26 | Disposition: A | Discharge status: Home / Self Care | Payer: No Typology Code available for payment source | Source: Ambulatory Visit | Attending: Infectious Diseases | Admitting: Infectious Diseases

## 2018-01-26 ENCOUNTER — Ambulatory Visit (INDEPENDENT_AMBULATORY_CARE_PROVIDER_SITE_OTHER): Payer: No Typology Code available for payment source | Admitting: Infectious Diseases

## 2018-01-26 VITALS — BP 161/98 | HR 90 | Temp 98.1°F | Wt 201.1 lb

## 2018-01-26 DIAGNOSIS — J90 Pleural effusion, not elsewhere classified: Secondary | ICD-10-CM

## 2018-01-26 NOTE — Patient Instructions (Addendum)
Continue you TB medications for now.   Will call you with the results of your chest xray and discuss what we need to do next and when you have your follow up with Dr. Daiva EvesVan Dam.   I am very glad you are feeling better.

## 2018-01-26 NOTE — Progress Notes (Signed)
Subjective:   Chief complaint: 1 week follow up recurrent effusion, ?TB   Patient ID: Scott Rowe, male    DOB: 1971-06-12, 47 y.o.   MRN: [id number]  Scott Rowe is here today 1 week after he saw Dr. Daiva Eves to ensure his symptoms have not progressed regarding pain, shortness of breath and dyspnea on exertion. His follow up chest x-ray indicated right pleural effusion last week in the setting of worsened pain and possibly some anxiety/worry. Over the last 4 days he tells me he has felt much improved and is breathing much better and more comfortably with only occasional 'twinges of pain' in the right shoulder back area and actually feels more in the left shoulder/back now. 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 Would like to consider stopping this if it is "not helping." No fevers, chills or night sweats. No productive cough. No weight loss. No side effects from RIPE therapy from what he can tell.    Past Medical History:  Diagnosis Date  . Hypertension     Past Surgical History:  Procedure Laterality Date  . IR THORACENTESIS ASP PLEURAL SPACE W/IMG GUIDE  11/18/2017  . IR THORACENTESIS ASP PLEURAL SPACE W/IMG GUIDE  11/24/2017    No family history on file.    Social History   Socioeconomic History  . Marital status: Single    Spouse name: Not on file  . Number of children: Not on file  . Years of education: Not on file  . Highest education level: Not on file  Occupational History  . Not on file  Social Needs  . Financial resource strain: Not on file  . Food insecurity:    Worry: Not on file    Inability: Not on file  . Transportation needs:    Medical: Not on file    Non-medical: Not on file  Tobacco Use  . Smoking status: Never Smoker  . Smokeless tobacco: Never Used  Substance and Sexual Activity  . Alcohol use: No    Frequency: Never  . Drug use: No  . Sexual activity: Not on file  Lifestyle  . Physical activity:    Days per week: Not on file   Minutes per session: Not on file  . Stress: Not on file  Relationships  . Social connections:    Talks on phone: Not on file    Gets together: Not on file    Attends religious service: Not on file    Active member of club or organization: Not on file    Attends meetings of clubs or organizations: Not on file    Relationship status: Not on file  Other Topics Concern  . Not on file  Social History Narrative  . Not on file    No Known Allergies   Current Outpatient Medications:  .  acetaminophen (TYLENOL) 500 MG tablet, Take 500 mg by mouth every 6 (six) hours as needed for mild pain., Disp: , Rfl:  .  aspirin EC 81 MG EC tablet, Take 1 tablet (81 mg total) by mouth daily. With food, Disp: 30 tablet, Rfl: 3 .  Aspirin-Acetaminophen-Caffeine (GOODY HEADACHE PO), Take 1 packet by mouth every 6 (six) hours as needed (pain)., Disp: , Rfl:  .  diltiazem (CARDIZEM CD) 120 MG 24 hr capsule, Take 1 capsule (120 mg total) by mouth daily., Disp: 30 capsule, Rfl: 3 .  ethambutol (MYAMBUTOL) 400 MG tablet, Take 4 tablets (1,600 mg total) by mouth daily., Disp: 120 tablet,  Rfl: 3 .  ibuprofen (MOTRIN IB) 200 MG tablet, Take 2 tablets (400 mg total) by mouth every 6 (six) hours as needed for fever, headache or moderate pain. With food, Disp: 100 tablet, Rfl: 2 .  isoniazid (NYDRAZID) 300 MG tablet, Take 1 tablet (300 mg total) by mouth daily., Disp: 30 tablet, Rfl: 3 .  metoprolol tartrate (LOPRESSOR) 100 MG tablet, Take 1 tablet (100 mg total) by mouth 2 (two) times daily., Disp: 60 tablet, Rfl: 3 .  pyrazinamide 500 MG tablet, Take 4 tablets (2,000 mg total) by mouth daily., Disp: 120 tablet, Rfl: 3 .  pyridOXINE (B-6) 50 MG tablet, Take 1 tablet (50 mg total) by mouth daily., Disp: 30 tablet, Rfl: 3 .  rifampin (RIFADIN) 300 MG capsule, Take 2 capsules (600 mg total) by mouth daily., Disp: 60 capsule, Rfl: 3 .  vitamin C (ASCORBIC ACID) 500 MG tablet, Take 1 tablet (500 mg total) by mouth 2 (two)  times daily., Disp: 60 tablet, Rfl: 3 .  oxyCODONE (OXY IR/ROXICODONE) 5 MG immediate release tablet, Take 1 tablet (5 mg total) by mouth every 6 (six) hours as needed for moderate pain or severe pain. (Patient not taking: Reported on 01/20/2018), Disp: 30 tablet, Rfl: 0   Review of Systems  Constitutional: Negative for activity change, appetite change, chills, diaphoresis, fatigue, fever and unexpected weight change.  HENT: Negative for congestion, rhinorrhea, sinus pressure, sneezing, sore throat and trouble swallowing.   Eyes: Negative for photophobia and visual disturbance.  Respiratory: Negative for cough, chest tightness, shortness of breath, wheezing and stridor.   Cardiovascular: Positive for chest pain (posterior, mild). Negative for palpitations and leg swelling.  Gastrointestinal: Negative for abdominal distention, abdominal pain, anal bleeding, blood in stool, constipation, diarrhea, nausea and vomiting.  Genitourinary: Negative for difficulty urinating, dysuria, flank pain and hematuria.  Musculoskeletal: Negative for arthralgias, back pain, gait problem, joint swelling and myalgias.  Skin: Negative for color change, pallor, rash and wound.  Neurological: Negative for dizziness, tremors, weakness and light-headedness.  Hematological: Negative for adenopathy. Does not bruise/bleed easily.  Psychiatric/Behavioral: Negative for agitation, behavioral problems, confusion, decreased concentration, dysphoric mood and sleep disturbance.       Objective:   Physical Exam  Constitutional: He is oriented to person, place, and time. He appears well-developed and well-nourished.  Seated comfortably in chair. Nervous/anxious.   HENT:  Head: Normocephalic and atraumatic.  Mouth/Throat: No oropharyngeal exudate.  Eyes: Conjunctivae and EOM are normal. No scleral icterus.  Neck: Normal range of motion. Neck supple.  Cardiovascular: Normal rate, regular rhythm and normal heart sounds. Exam  reveals no gallop and no friction rub.  No murmur heard. Pulmonary/Chest: Effort normal and breath sounds normal. No respiratory distress. He has no wheezes. He has no rales.  Abdominal: Soft. He exhibits no distension.  Musculoskeletal: Normal range of motion. He exhibits no edema or tenderness.  Lymphadenopathy:    He has no cervical adenopathy.  Neurological: He is alert and oriented to person, place, and time.  Skin: Skin is warm and dry. No rash noted. No erythema. No pallor.       Assessment & Plan:   Problem List Items Addressed This Visit      Respiratory   Recurrent right pleural effusion - Primary    Normal lung exam today. No perceived dullness to percussion. We discussed obtaining repeated x-ray today to follow previous finding. He will have A/P and bilateral decubitus views.   Continue RIPE for now.   Will coordinate  CT surgery evaluation.   Follow up to be determined with Dr. Daiva EvesVan Dam depending on findings on today's exam.   ADDENDUM:  Stable to improved "tiny" effusion on the right. Dr. Lowella FairyGerhart reviewed images and will hold on CT surgery eval for now. If recurrence will need to consider VATS to find alternative diagnosis.       Relevant Orders   DG Chest 2 View (Completed)   DG Chest Bilateral Decubitus (Completed)     Rexene AlbertsStephanie Dixon, MSN, NP-C [facility] Office: [phone] Pager: [phone]

## 2018-01-27 ENCOUNTER — Encounter: Payer: Self-pay | Admitting: Infectious Disease

## 2018-01-28 ENCOUNTER — Encounter (INDEPENDENT_AMBULATORY_CARE_PROVIDER_SITE_OTHER): Payer: Self-pay

## 2018-02-05 NOTE — Assessment & Plan Note (Signed)
Normal lung exam today. No perceived dullness to percussion. We discussed obtaining repeated x-ray today to follow previous finding. He will have A/P and bilateral decubitus views.   Continue RIPE for now.   Will coordinate CT surgery evaluation.   Follow up to be determined with Dr. Daiva EvesVan Dam depending on findings on today's exam.   ADDENDUM:  Stable to improved "tiny" effusion on the right. Dr. Lowella FairyGerhart reviewed images and will hold on CT surgery eval for now. If recurrence will need to consider VATS to find alternative diagnosis.

## 2018-03-03 ENCOUNTER — Encounter: Payer: Self-pay | Admitting: Infectious Disease

## 2018-03-03 ENCOUNTER — Ambulatory Visit (INDEPENDENT_AMBULATORY_CARE_PROVIDER_SITE_OTHER): Payer: Self-pay | Admitting: Infectious Disease

## 2018-03-03 VITALS — BP 166/109 | HR 77 | Temp 98.6°F | Wt 206.0 lb

## 2018-03-03 DIAGNOSIS — R42 Dizziness and giddiness: Secondary | ICD-10-CM

## 2018-03-03 DIAGNOSIS — J90 Pleural effusion, not elsewhere classified: Secondary | ICD-10-CM

## 2018-03-03 DIAGNOSIS — I10 Essential (primary) hypertension: Secondary | ICD-10-CM

## 2018-03-03 DIAGNOSIS — H538 Other visual disturbances: Secondary | ICD-10-CM

## 2018-03-03 DIAGNOSIS — A1889 Tuberculosis of other sites: Secondary | ICD-10-CM

## 2018-03-03 HISTORY — DX: Other visual disturbances: H53.8

## 2018-03-03 HISTORY — DX: Dizziness and giddiness: R42

## 2018-03-03 MED ORDER — HYDROCHLOROTHIAZIDE 25 MG PO TABS: 25.0000 mg | ORAL_TABLET | Freq: Every day | ORAL | 11 refills | Status: AC

## 2018-03-03 NOTE — Progress Notes (Signed)
Subjective:    Chief complaint: Blurry vision and dizziness.  Patient ID: Scott Rowe, male    DOB: 22-Jan-1971, 47 y.o.   MRN: [id number]  HPI  Mr Mcenery is originally from [place] who presented with 3 weeks of progressive dyspnea, cough, and pleuritic chest pain. CT chest on admission illustrated a L>R pleural effusion with compressive atelectasis vs infiltration with nonspecific enlarged lymph nodes.Pleural fluid analysis x3 indicating an exudative processwith unclear etiology   We ended up initiating RIPE which he continues to get via the [facility]. Hadassah Pais had emailed me on Monday about him. His pleural fluid was remarkable for having normal ADA and all of his AFB cultures have been negative though the [facility] sputa are still incubating.   He had been improving and his CXR's had shown no evidence of recurrent effusion.  Several visits ago I was concerned because he was complaining of feeling like he did prior to his diagnosis of his pleural effusion and having difficulty breathing and feeling fullness in his chest.  Since then however he actually has had improvement in his symptoms and his chest x-rays have been reassuring.  He is complaining of blurry vision and dizziness which happened in the last 3 weeks.  He correlates this with change in his antituberculosis therapy to several times weekly instead of daily therapy.  Of note he is quite hypertensive in the clinic today he date states he is taking his diltiazem and metoprolol but not taking the metoprolol twice daily.  He is not plugged in with primary care I suspect because of lack of insurance though he should have this arranged by the hospitalist service when he was an inpatient and perhaps it was.  In any case he needs to be established with one clearly.  His blurry vision does not involve changes in color which is more typical of that seen with a febrile toxicity.  I did not have the color test for vision on hand at the time when I saw  him in the clinic but perhaps this can be administered by the health department when they visit him at home and perhaps have already done so.  Past Medical History:  Diagnosis Date  . Hypertension     Past Surgical History:  Procedure Laterality Date  . IR THORACENTESIS ASP PLEURAL SPACE W/IMG GUIDE  11/18/2017  . IR THORACENTESIS ASP PLEURAL SPACE W/IMG GUIDE  11/24/2017    No family history on file.    Social History   Socioeconomic History  . Marital status: Single    Spouse name: Not on file  . Number of children: Not on file  . Years of education: Not on file  . Highest education level: Not on file  Occupational History  . Not on file  Social Needs  . Financial resource strain: Not on file  . Food insecurity:    Worry: Not on file    Inability: Not on file  . Transportation needs:    Medical: Not on file    Non-medical: Not on file  Tobacco Use  . Smoking status: Never Smoker  . Smokeless tobacco: Never Used  Substance and Sexual Activity  . Alcohol use: No    Frequency: Never  . Drug use: No  . Sexual activity: Not on file  Lifestyle  . Physical activity:    Days per week: Not on file    Minutes per session: Not on file  . Stress: Not on file  Relationships  . Social  connections:    Talks on phone: Not on file    Gets together: Not on file    Attends religious service: Not on file    Active member of club or organization: Not on file    Attends meetings of clubs or organizations: Not on file    Relationship status: Not on file  Other Topics Concern  . Not on file  Social History Narrative  . Not on file    No Known Allergies   Current Outpatient Medications:  .  acetaminophen (TYLENOL) 500 MG tablet, Take 500 mg by mouth every 6 (six) hours as needed for mild pain., Disp: , Rfl:  .  aspirin EC 81 MG EC tablet, Take 1 tablet (81 mg total) by mouth daily. With food, Disp: 30 tablet, Rfl: 3 .  Aspirin-Acetaminophen-Caffeine (GOODY HEADACHE PO), Take  1 packet by mouth every 6 (six) hours as needed (pain)., Disp: , Rfl:  .  diltiazem (CARDIZEM CD) 120 MG 24 hr capsule, Take 1 capsule (120 mg total) by mouth daily., Disp: 30 capsule, Rfl: 3 .  ethambutol (MYAMBUTOL) 400 MG tablet, Take 4 tablets (1,600 mg total) by mouth daily., Disp: 120 tablet, Rfl: 3 .  ibuprofen (MOTRIN IB) 200 MG tablet, Take 2 tablets (400 mg total) by mouth every 6 (six) hours as needed for fever, headache or moderate pain. With food, Disp: 100 tablet, Rfl: 2 .  isoniazid (NYDRAZID) 300 MG tablet, Take 1 tablet (300 mg total) by mouth daily., Disp: 30 tablet, Rfl: 3 .  metoprolol tartrate (LOPRESSOR) 100 MG tablet, Take 1 tablet (100 mg total) by mouth 2 (two) times daily., Disp: 60 tablet, Rfl: 3 .  oxyCODONE (OXY IR/ROXICODONE) 5 MG immediate release tablet, Take 1 tablet (5 mg total) by mouth every 6 (six) hours as needed for moderate pain or severe pain., Disp: 30 tablet, Rfl: 0 .  pyrazinamide 500 MG tablet, Take 4 tablets (2,000 mg total) by mouth daily., Disp: 120 tablet, Rfl: 3 .  pyridOXINE (B-6) 50 MG tablet, Take 1 tablet (50 mg total) by mouth daily., Disp: 30 tablet, Rfl: 3 .  rifampin (RIFADIN) 300 MG capsule, Take 2 capsules (600 mg total) by mouth daily., Disp: 60 capsule, Rfl: 3 .  vitamin C (ASCORBIC ACID) 500 MG tablet, Take 1 tablet (500 mg total) by mouth 2 (two) times daily., Disp: 60 tablet, Rfl: 3   Review of Systems  Constitutional: Negative for activity change, appetite change, chills, diaphoresis, fatigue, fever and unexpected weight change.  HENT: Negative for congestion, rhinorrhea, sinus pressure, sneezing, sore throat and trouble swallowing.   Eyes: Positive for visual disturbance. Negative for photophobia.  Respiratory: Negative for cough, chest tightness, shortness of breath, wheezing and stridor.   Cardiovascular: Negative for chest pain, palpitations and leg swelling.  Gastrointestinal: Negative for abdominal distention, abdominal pain,  anal bleeding, blood in stool, constipation, diarrhea, nausea and vomiting.  Genitourinary: Negative for difficulty urinating, dysuria, flank pain and hematuria.  Musculoskeletal: Negative for arthralgias, back pain, gait problem, joint swelling and myalgias.  Skin: Negative for color change, pallor, rash and wound.  Neurological: Positive for dizziness. Negative for tremors, weakness and light-headedness.  Hematological: Negative for adenopathy. Does not bruise/bleed easily.  Psychiatric/Behavioral: Negative for agitation, behavioral problems, confusion, decreased concentration, dysphoric mood and sleep disturbance.       Objective:   Physical Exam  Constitutional: He is oriented to person, place, and time. He appears well-developed and well-nourished.  HENT:  Head: Normocephalic and atraumatic.  Eyes: Conjunctivae and EOM are normal.  Neck: Normal range of motion. Neck supple.  Cardiovascular: Normal rate, regular rhythm and normal heart sounds. Exam reveals no gallop and no friction rub.  No murmur heard. Pulmonary/Chest: Effort normal and breath sounds normal. No respiratory distress. He has no wheezes. He has no rales.  Abdominal: Soft. He exhibits no distension.  Musculoskeletal: Normal range of motion. He exhibits no edema or tenderness.  Neurological: He is alert and oriented to person, place, and time.  Skin: Skin is warm and dry. No rash noted. No erythema. No pallor.          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  I have some anxiety  that these symptoms could be related to his ethambutol though he does not have the color vision problems.  I will add hydrochlorothiazide to his antihypertensive regimen and have asked that he establish himself with primary care.  I will also see him back in 1 month's time.  Finally I will communicate with health department.  I spent greater than 40 minutes with the patient including greater than 50% of time in face to face counsel of the patient re his recurrent effusions, differential of his dizziness and BV, educating him re the risks of HTN including CVA, ESRD  and in coordination of his care.

## 2018-03-03 NOTE — Patient Instructions (Signed)
I am adding Hydrochlorothiazide to your blood pressure medications You need to have a Primary care Doctor to manage your high blood pressure  I will talk to the [facility]  Re the TB drugs as well  Please take all of your blood pressure medications and come back to see me and bring all the medicines that you are taking with you to clinic

## 2018-03-04 LAB — CBC WITH DIFFERENTIAL/PLATELET
BASOS PCT: 0.3 %
Basophils Absolute: 19 cells/uL (ref 0–200)
EOS ABS: 58 {cells}/uL (ref 15–500)
Eosinophils Relative: 0.9 %
HCT: 46.4 % (ref 38.5–50.0)
Hemoglobin: 15 g/dL (ref 13.2–17.1)
Lymphs Abs: 2106 cells/uL (ref 850–3900)
MCH: 25.3 pg — AB (ref 27.0–33.0)
MCHC: 32.3 g/dL (ref 32.0–36.0)
MCV: 78.4 fL — AB (ref 80.0–100.0)
MONOS PCT: 9.3 %
MPV: 9.7 fL (ref 7.5–12.5)
Neutro Abs: 3622 cells/uL (ref 1500–7800)
Neutrophils Relative %: 56.6 %
PLATELETS: 230 10*3/uL (ref 140–400)
RBC: 5.92 10*6/uL — AB (ref 4.20–5.80)
RDW: 14.9 % (ref 11.0–15.0)
TOTAL LYMPHOCYTE: 32.9 %
WBC mixed population: 595 cells/uL (ref 200–950)
WBC: 6.4 10*3/uL (ref 3.8–10.8)

## 2018-03-04 LAB — COMPLETE METABOLIC PANEL WITH GFR
AG RATIO: 1.3 (calc) (ref 1.0–2.5)
ALBUMIN MSPROF: 4.1 g/dL (ref 3.6–5.1)
ALKALINE PHOSPHATASE (APISO): 98 U/L (ref 40–115)
ALT: 22 U/L (ref 9–46)
AST: 20 U/L (ref 10–40)
BUN: 13 mg/dL (ref 7–25)
CO2: 26 mmol/L (ref 20–32)
Calcium: 9.3 mg/dL (ref 8.6–10.3)
Chloride: 105 mmol/L (ref 98–110)
Creat: 1.06 mg/dL (ref 0.60–1.35)
GFR, Est African American: 98 mL/min/{1.73_m2} (ref >=60)
GFR, Est Non African American: 84 mL/min/{1.73_m2} (ref >=60)
GLOBULIN: 3.2 g/dL (ref 1.9–3.7)
Glucose, Bld: 96 mg/dL (ref 65–99)
POTASSIUM: 3.9 mmol/L (ref 3.5–5.3)
SODIUM: 140 mmol/L (ref 135–146)
Total Bilirubin: 0.7 mg/dL (ref 0.2–1.2)
Total Protein: 7.3 g/dL (ref 6.1–8.1)

## 2018-03-04 LAB — SEDIMENTATION RATE: Sed Rate: 2 mm/h (ref 0–15)

## 2018-03-04 LAB — C-REACTIVE PROTEIN: CRP: 4.1 mg/L (ref <8.0)

## 2018-05-05 ENCOUNTER — Ambulatory Visit (INDEPENDENT_AMBULATORY_CARE_PROVIDER_SITE_OTHER): Payer: Self-pay | Admitting: Infectious Disease

## 2018-05-05 VITALS — BP 159/114 | HR 85 | Temp 98.5°F | Ht 68.0 in | Wt 213.0 lb

## 2018-05-05 DIAGNOSIS — Z9889 Other specified postprocedural states: Secondary | ICD-10-CM

## 2018-05-05 DIAGNOSIS — I10 Essential (primary) hypertension: Secondary | ICD-10-CM

## 2018-05-05 DIAGNOSIS — J90 Pleural effusion, not elsewhere classified: Secondary | ICD-10-CM

## 2018-05-05 DIAGNOSIS — A1889 Tuberculosis of other sites: Secondary | ICD-10-CM

## 2018-05-05 NOTE — Progress Notes (Signed)
Subjective:    Chief complaint: weight gain  Patient ID: Scott Rowe, male    DOB: 1971/06/02, 47 y.o.   MRN: [id number]  HPI  Mr Scheffel is originally from [place] who presented with 3 weeks of progressive dyspnea, cough, and pleuritic chest pain. CT chest on admission illustrated a L>R pleural effusion with compressive atelectasis vs infiltration with nonspecific enlarged lymph nodes.Pleural fluid analysis x3 indicating an exudative processwith unclear etiology   We ended up initiating RIPE which he continues to get via the [facility]. Hadassah Pais had emailed me on Monday about him. His pleural fluid was remarkable for having normal ADA and all of his AFB cultures have been negative though the [facility] sputa are still incubating.   He had been improving and his CXR's had shown no evidence of recurrent effusion.  Several visits ago I was concerned because he was complaining of feeling like he did prior to his diagnosis of his pleural effusion and having difficulty breathing and feeling fullness in his chest.  Since then however he actually has had improvement in his symptoms and his chest x-rays have been reassuring.  He also had problems with blurry vision and has been quite hypertensive.  He was prescribed medications by the hospitalist at discharge and should have a primary care doctor following this.  He seems to think that I am his primary care physician I informed him that I cannot be his primary care physician unless he has a chronic infectious disease that I will be managing.  We will make sure that he gets plugged into primary care and that he is getting his antihypertensive medications.  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  Also complained of weight gain.  I expect this is because either tuberculosis or some other inflammatory process that was causing his  exudative pleural effusions is now resolving or resolved and therefore without this inflammation he is now experiencing weight gain.  Past Medical History:  Diagnosis Date  . Blurry vision 03/03/2018  . Dizziness 03/03/2018  . Hypertension     Past Surgical History:  Procedure Laterality Date  . IR THORACENTESIS ASP PLEURAL SPACE W/IMG GUIDE  11/18/2017  . IR THORACENTESIS ASP PLEURAL SPACE W/IMG GUIDE  11/24/2017    No family history on file.    Social History   Socioeconomic History  . Marital status: Divorced    Spouse name: Not on file  . Number of children: Not on file  . Years of education: Not on file  . Highest education level: Not on file  Occupational History  . Not on file  Social Needs  . Financial resource strain: Not on file  . Food insecurity:    Worry: Not on file    Inability: Not on file  . Transportation needs:    Medical: Not on file    Non-medical: Not on file  Tobacco Use  . Smoking status: Never Smoker  . Smokeless tobacco: Never Used  Substance and Sexual Activity  . Alcohol use: No    Frequency: Never  . Drug use: No  . Sexual activity: Not on file  Lifestyle  . Physical activity:    Days per week: Not on file    Minutes per session: Not on file  . Stress: Not on file  Relationships  . Social connections:    Talks  on phone: Not on file    Gets together: Not on file    Attends religious service: Not on file    Active member of club or organization: Not on file    Attends meetings of clubs or organizations: Not on file    Relationship status: Not on file  Other Topics Concern  . Not on file  Social History Narrative  . Not on file    No Known Allergies   Current Outpatient Medications:  .  acetaminophen (TYLENOL) 500 MG tablet, Take 500 mg by mouth every 6 (six) hours as needed for mild pain., Disp: , Rfl:  .  aspirin EC 81 MG EC tablet, Take 1 tablet (81 mg total) by mouth daily. With food, Disp: 30 tablet, Rfl: 3 .   Aspirin-Acetaminophen-Caffeine (GOODY HEADACHE PO), Take 1 packet by mouth every 6 (six) hours as needed (pain)., Disp: , Rfl:  .  diltiazem (CARDIZEM CD) 120 MG 24 hr capsule, Take 1 capsule (120 mg total) by mouth daily., Disp: 30 capsule, Rfl: 3 .  ethambutol (MYAMBUTOL) 400 MG tablet, Take 4 tablets (1,600 mg total) by mouth daily., Disp: 120 tablet, Rfl: 3 .  hydrochlorothiazide (HYDRODIURIL) 25 MG tablet, Take 1 tablet (25 mg total) by mouth daily., Disp: 30 tablet, Rfl: 11 .  ibuprofen (MOTRIN IB) 200 MG tablet, Take 2 tablets (400 mg total) by mouth every 6 (six) hours as needed for fever, headache or moderate pain. With food, Disp: 100 tablet, Rfl: 2 .  isoniazid (NYDRAZID) 300 MG tablet, Take 1 tablet (300 mg total) by mouth daily., Disp: 30 tablet, Rfl: 3 .  metoprolol tartrate (LOPRESSOR) 100 MG tablet, Take 1 tablet (100 mg total) by mouth 2 (two) times daily., Disp: 60 tablet, Rfl: 3 .  oxyCODONE (OXY IR/ROXICODONE) 5 MG immediate release tablet, Take 1 tablet (5 mg total) by mouth every 6 (six) hours as needed for moderate pain or severe pain., Disp: 30 tablet, Rfl: 0 .  pyrazinamide 500 MG tablet, Take 4 tablets (2,000 mg total) by mouth daily., Disp: 120 tablet, Rfl: 3 .  pyridOXINE (B-6) 50 MG tablet, Take 1 tablet (50 mg total) by mouth daily., Disp: 30 tablet, Rfl: 3 .  rifampin (RIFADIN) 300 MG capsule, Take 2 capsules (600 mg total) by mouth daily., Disp: 60 capsule, Rfl: 3 .  vitamin C (ASCORBIC ACID) 500 MG tablet, Take 1 tablet (500 mg total) by mouth 2 (two) times daily., Disp: 60 tablet, Rfl: 3   Review of Systems  Constitutional: Positive for unexpected weight change. Negative for activity change, appetite change, chills, diaphoresis, fatigue and fever.  HENT: Negative for congestion, rhinorrhea, sinus pressure, sneezing, sore throat and trouble swallowing.   Eyes: Positive for visual disturbance. Negative for photophobia.  Respiratory: Negative for cough, chest  tightness, shortness of breath, wheezing and stridor.   Cardiovascular: Negative for chest pain, palpitations and leg swelling.  Gastrointestinal: Negative for abdominal distention, abdominal pain, anal bleeding, blood in stool, constipation, diarrhea, nausea and vomiting.  Genitourinary: Negative for difficulty urinating, dysuria, flank pain and hematuria.  Musculoskeletal: Negative for arthralgias, back pain, gait problem, joint swelling and myalgias.  Skin: Negative for color change, pallor, rash and wound.  Neurological: Negative for tremors, weakness and light-headedness.  Hematological: Negative for adenopathy. Does not bruise/bleed easily.  Psychiatric/Behavioral: Negative for agitation, behavioral problems, confusion, decreased concentration, dysphoric mood and sleep disturbance.       Objective:   Physical Exam  Constitutional: He is oriented to person,  place, and time. He appears well-developed and well-nourished.  HENT:  Head: Normocephalic and atraumatic.  Eyes: Conjunctivae and EOM are normal.  Neck: Normal range of motion. Neck supple.  Cardiovascular: Normal rate, regular rhythm and normal heart sounds. Exam reveals no gallop and no friction rub.  No murmur heard. Pulmonary/Chest: Effort normal and breath sounds normal. No respiratory distress. He has no wheezes. He has no rales.  Abdominal: Soft. He exhibits no distension.  Musculoskeletal: Normal range of motion. He exhibits no edema or tenderness.  Neurological: He is alert and oriented to person, place, and time.  Skin: Skin is warm and dry. No rash noted. No erythema. No pallor.  Psychiatric: He has a normal mood and affect. His speech is normal and behavior is normal. Judgment and thought content normal. Cognition and memory are normal.          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  Hypertension: Make sure he is getting his medications

## 2018-05-18 ENCOUNTER — Ambulatory Visit
Admission: RE | Admit: 2018-05-18 | Discharge: 2018-05-18 | Disposition: A | Discharge status: Home / Self Care | Payer: No Typology Code available for payment source | Source: Ambulatory Visit | Attending: Internal Medicine | Admitting: Internal Medicine

## 2018-05-18 ENCOUNTER — Other Ambulatory Visit: Payer: Self-pay | Admitting: Internal Medicine

## 2018-05-18 DIAGNOSIS — Z09 Encounter for follow-up examination after completed treatment for conditions other than malignant neoplasm: Secondary | ICD-10-CM

## 2018-07-10 IMAGING — US US ABDOMEN LIMITED
1 series · 14 of 25 positions shown · non-contrast
Comparison: None.

CLINICAL DATA: Elevated LFTs

EXAM:
ULTRASOUND ABDOMEN LIMITED RIGHT UPPER QUADRANT

[Series 1: us abdomen limited · 0.28mm/px · 14 of 36 slices shown]
[im 1/36]
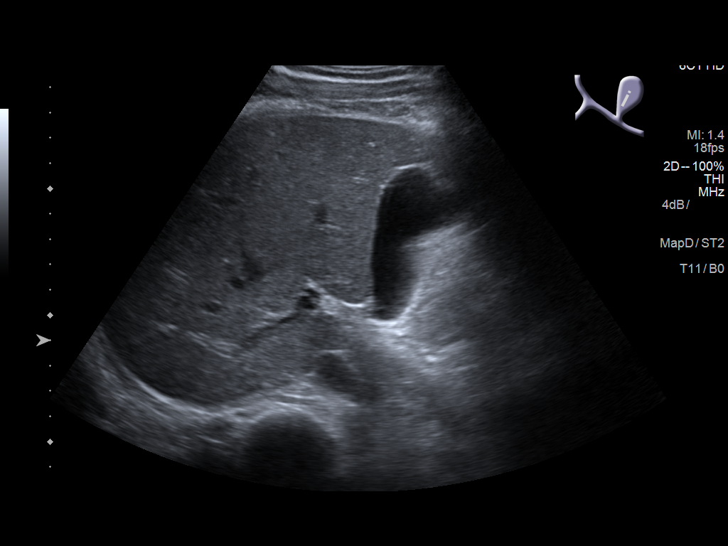
[im 3/36]
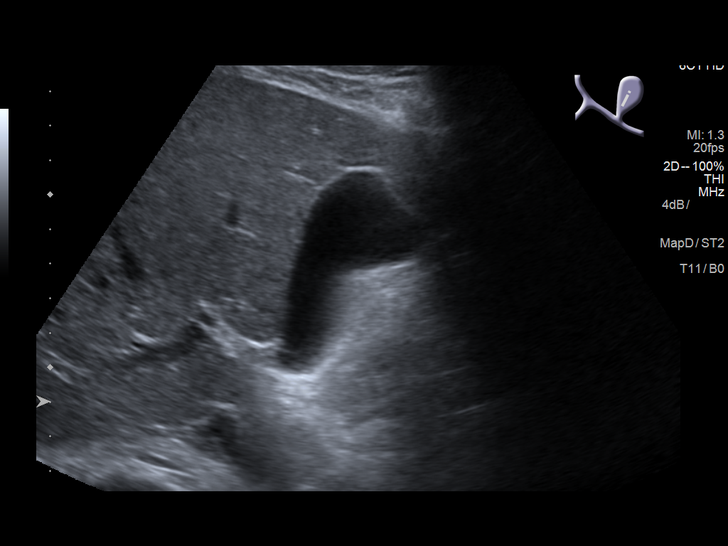
[im 6/36]
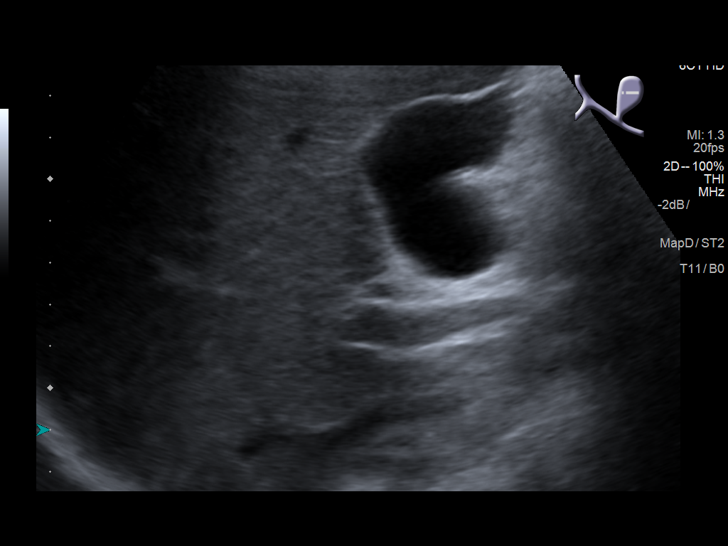
[im 9/36]
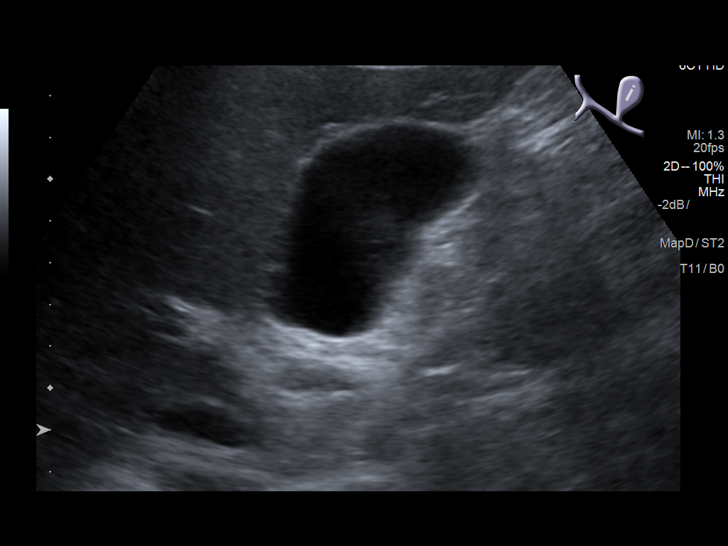
[im 12/36]
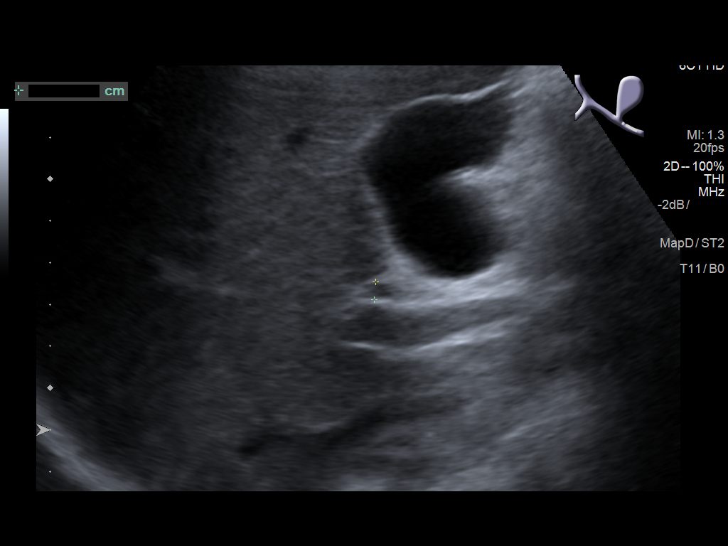
[im 14/36]
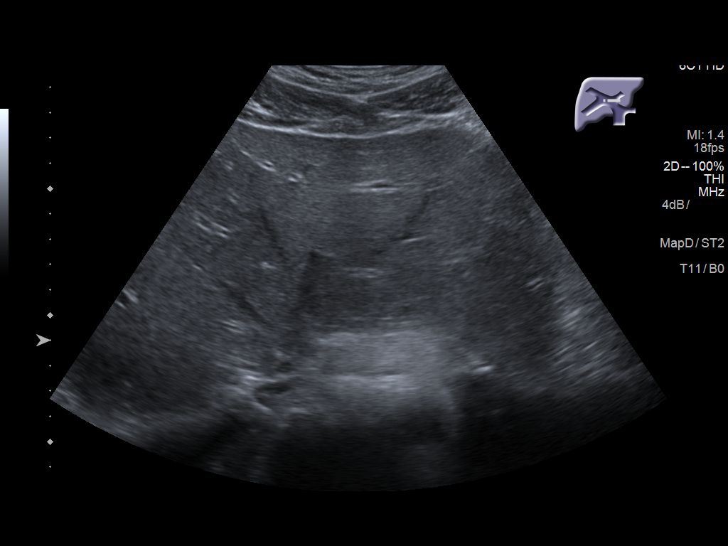
[im 17/36]
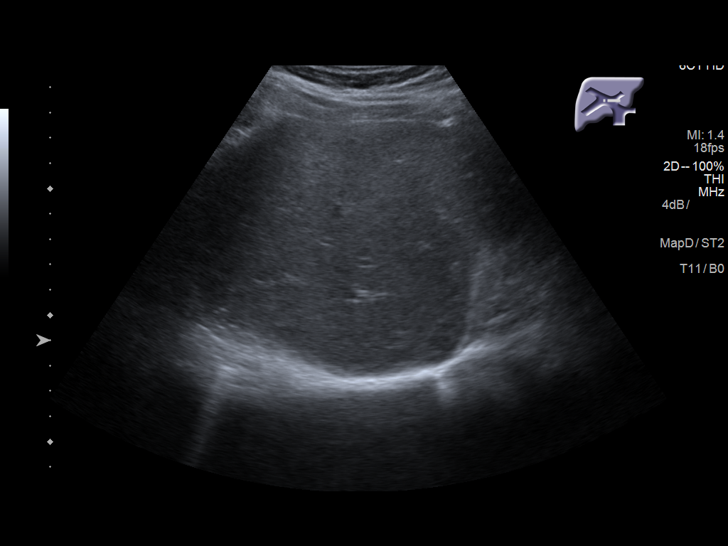
[im 19/36]
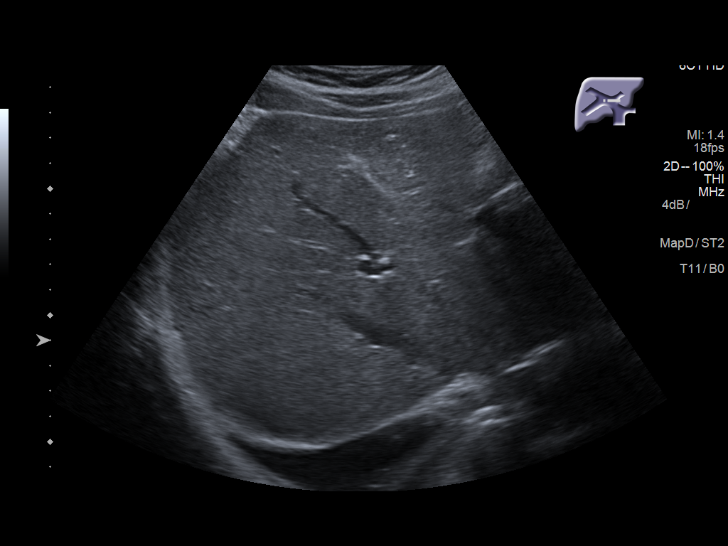
[im 22/36]
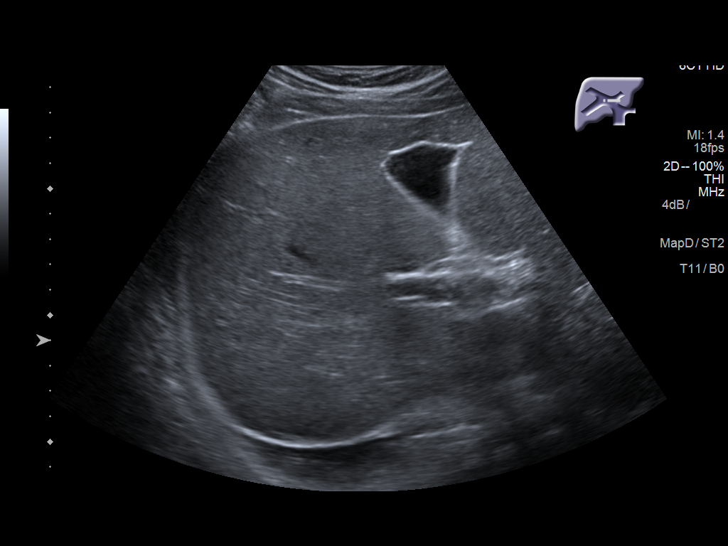
[im 24/36]
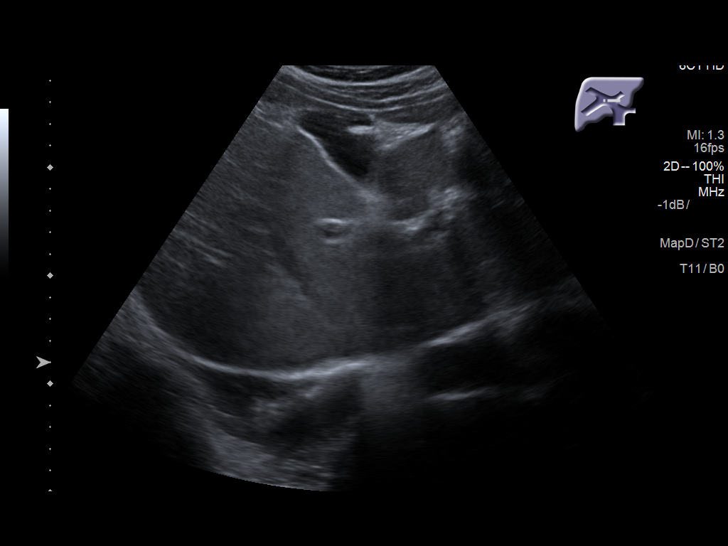
[im 27/36]
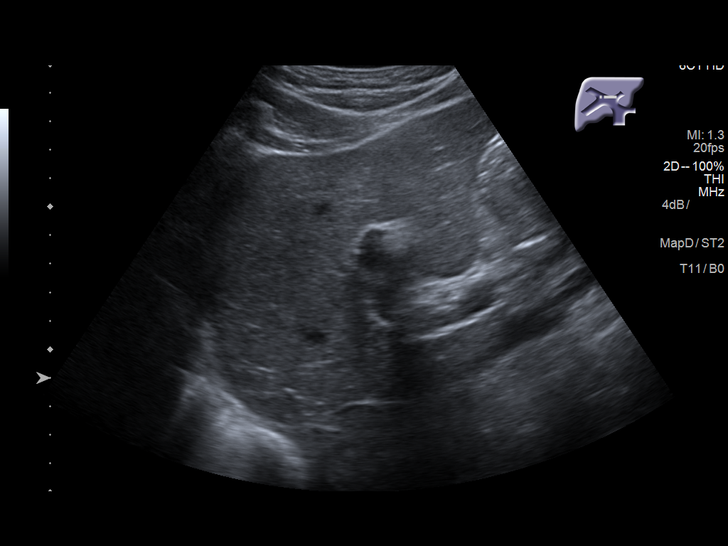
[im 30/36]
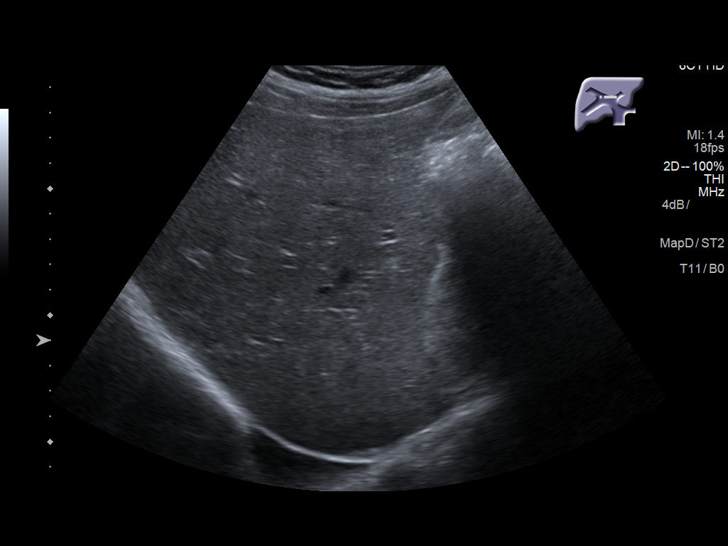
[im 33/36]
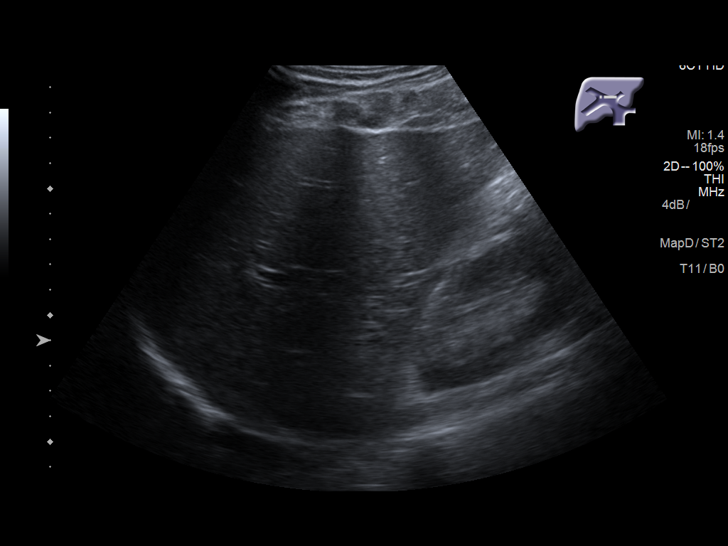
[im 36/36]
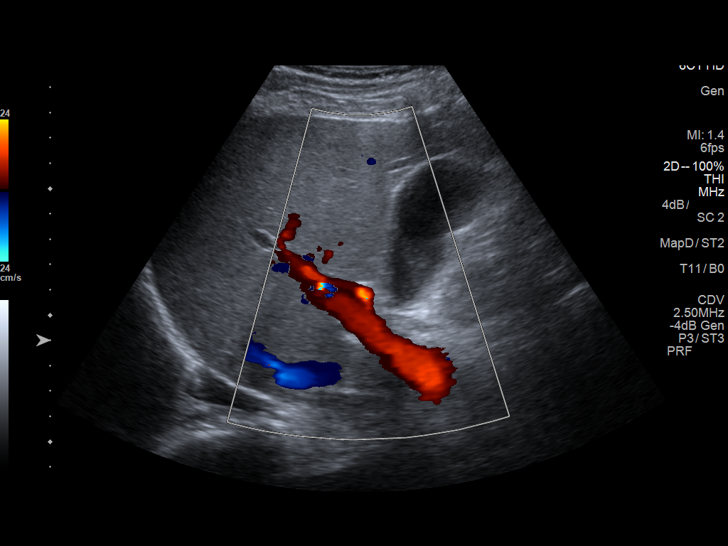

[14 of 25 positions shown; findings below may reference images not displayed]

FINDINGS: Gallbladder:

No gallstones or wall thickening visualized. No sonographic Murphy
sign noted by sonographer.

Common bile duct:

Diameter: 4.3 mm

Liver:

No focal lesion identified. Within normal limits in parenchymal
echogenicity. Portal vein is patent on color Doppler imaging with
normal direction of blood flow towards the liver.
IMPRESSION: Normal study.  No cause for elevated LFTs identified.

## 2018-07-16 IMAGING — US US THORACENTESIS ASP PLEURAL SPACE W/IMG GUIDE
1 series · 3 of 3 positions shown · non-contrast
Comparison: none

INDICATION: Patient with left pleural effusion. Request is made for diagnostic
and therapeutic thoracentesis.

[Series 1: us thoracentesis asp pleural space w/img guide · 0.26mm/px · 3 of 3 slices shown]
[im 1/3]
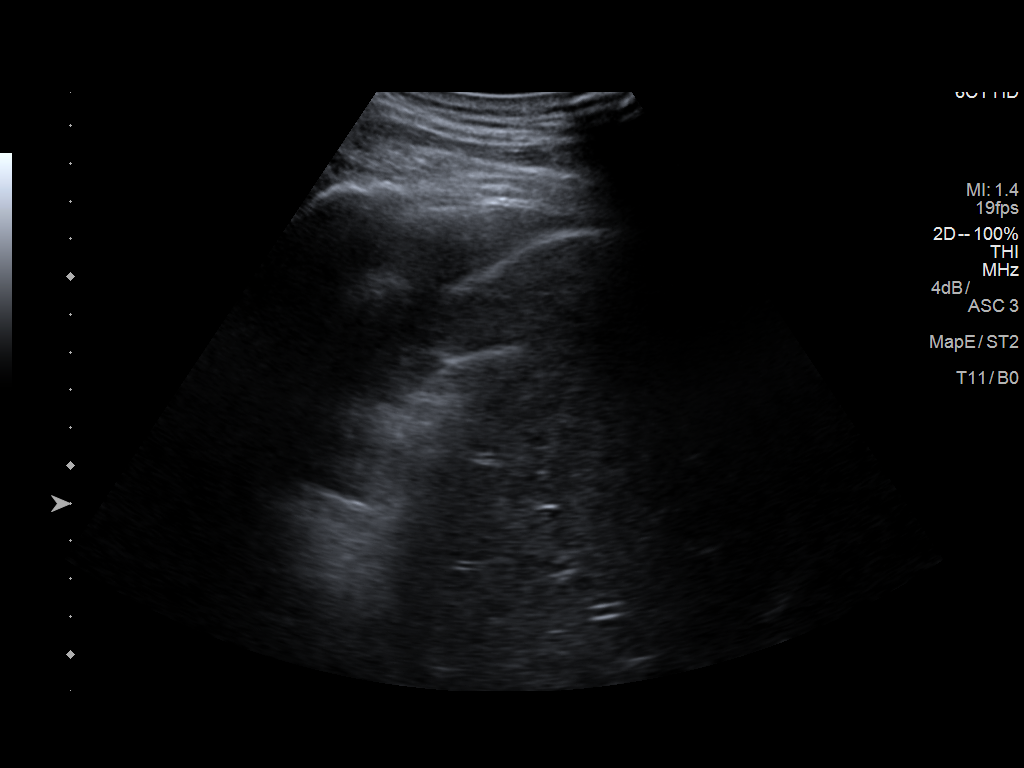
[im 2/3]
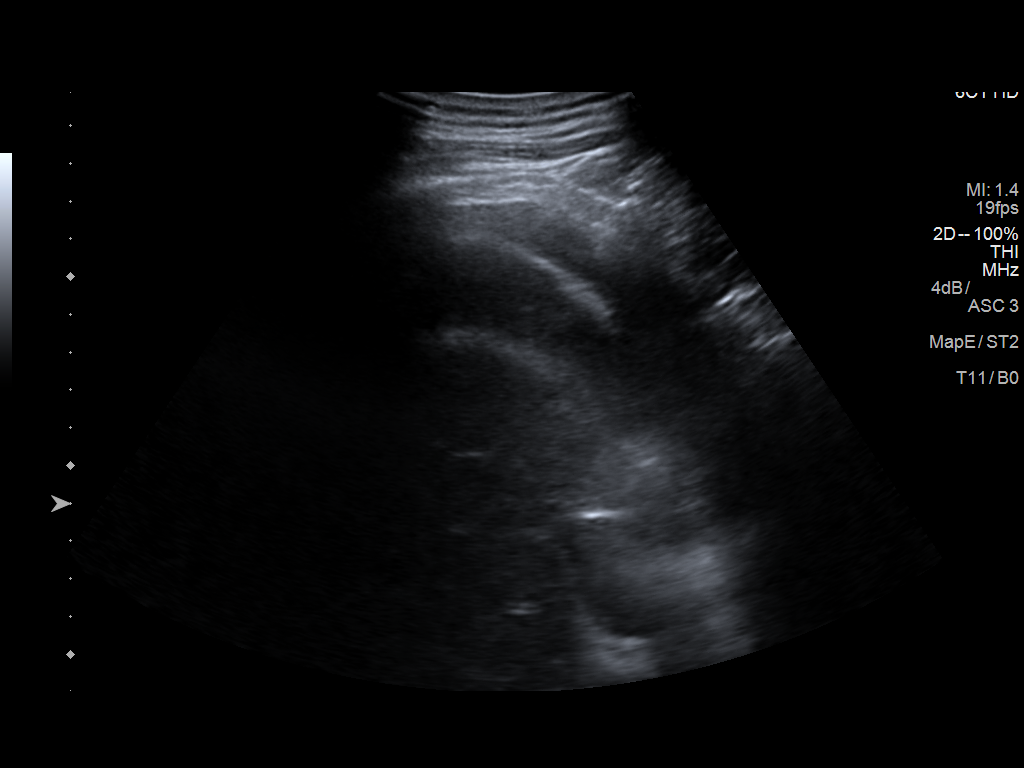
[im 3/3]
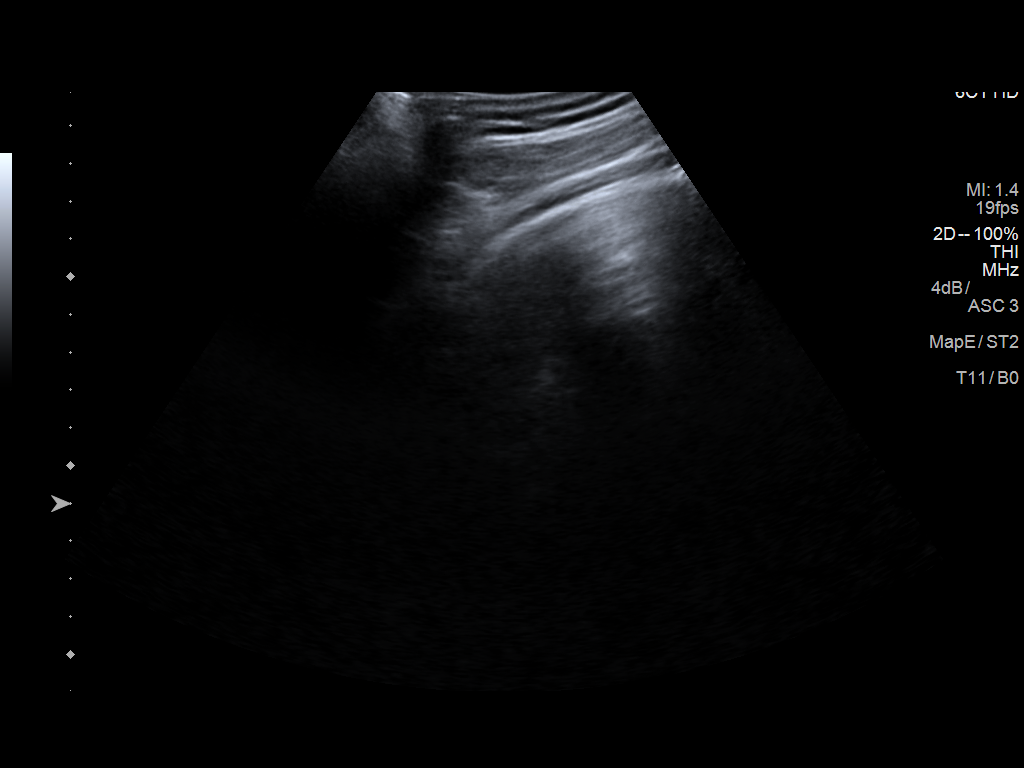

[3 of 3 positions shown; findings below may reference images not displayed]

EXAM:
ULTRASOUND GUIDED DIAGNOSTIC AND THERAPEUTIC LEFT THORACENTESIS

MEDICATIONS:
10 mL 1% lidocaine

COMPLICATIONS:
None immediate.

PROCEDURE:
An ultrasound guided thoracentesis was thoroughly discussed with the
patient and questions answered. The benefits, risks, alternatives
and complications were also discussed. The patient understands and
wishes to proceed with the procedure. Written consent was obtained.

Ultrasound was performed to localize and mark an adequate pocket of
fluid in the left chest. The area was then prepped and draped in the
normal sterile fashion. 1% Lidocaine was used for local anesthesia.
Under ultrasound guidance a Safe-T-Centesis catheter was introduced.
Thoracentesis was performed. The catheter was removed and a dressing
applied.
FINDINGS: A total of approximately 600 mL of yellow fluid was removed. Samples
were sent to the laboratory as requested by the clinical team.
IMPRESSION: Successful ultrasound guided diagnostic and therapeutic left
thoracentesis yielding 600 mL of pleural fluid.

## 2018-12-17 IMAGING — US IR THORACENTESIS ASP PLEURAL SPACE W/IMG GUIDE
1 series · 2 of 2 positions shown · non-contrast
Comparison: none

INDICATION: Patient with concern for TB, now with recurrent left pleural
effusion. Request is made for diagnostic and therapeutic
thoracentesis.

[Series 1: ir (id) (id)/(id)/(id) ir · 2 of 2 slices shown]
[im 1/2]
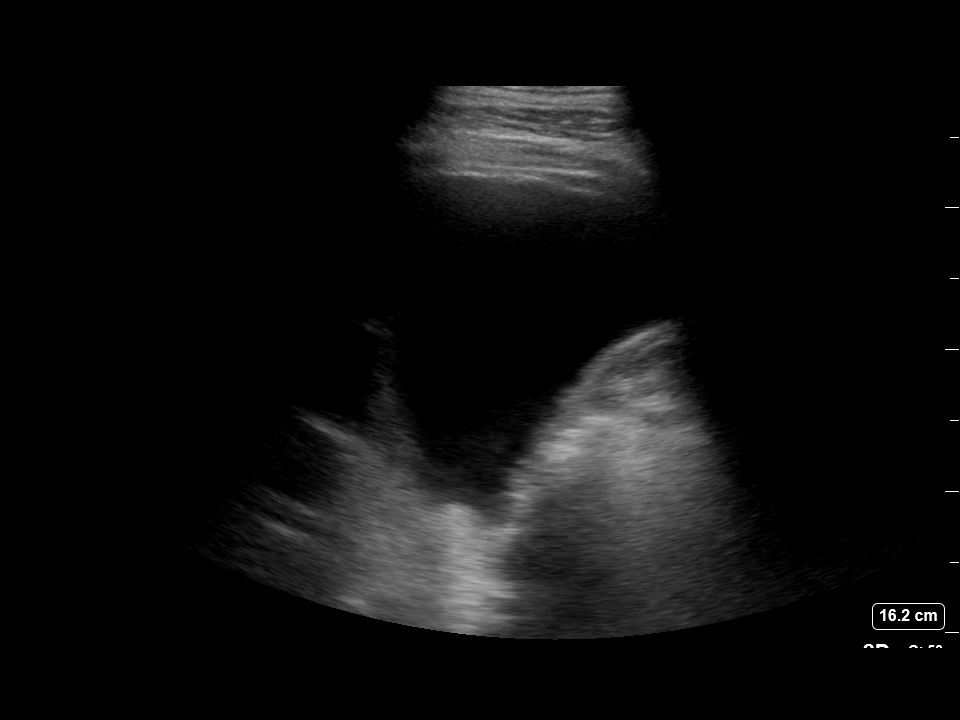
[im 2/2]
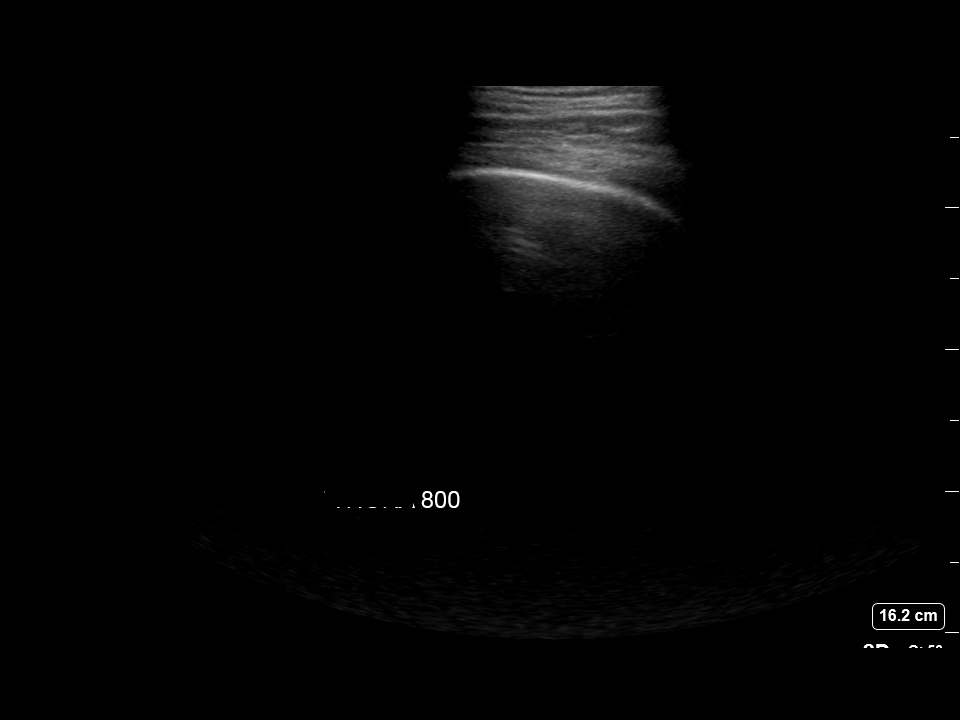

[2 of 2 positions shown; findings below may reference images not displayed]

EXAM:
ULTRASOUND GUIDED DIAGNOSTIC AND THERAPEUTIC THORACENTESIS

MEDICATIONS:
10 mL 2% lidocaine

COMPLICATIONS:
None immediate.

PROCEDURE:
An ultrasound guided thoracentesis was thoroughly discussed with the
patient and questions answered. The benefits, risks, alternatives
and complications were also discussed. The patient understands and
wishes to proceed with the procedure. Written consent was obtained.

Ultrasound was performed to localize and mark an adequate pocket of
fluid in the left chest. The area was then prepped and draped in the
normal sterile fashion. 2% Lidocaine was used for local anesthesia.
Under ultrasound guidance a Safe-T-Centesis catheter was introduced.
Thoracentesis was performed. The catheter was removed and a dressing
applied.
FINDINGS: A total of approximately 800 mL of amber fluid was removed. Samples
were sent to the laboratory as requested by the clinical team.
IMPRESSION: Successful ultrasound guided diagnostic and therapeutic left
thoracentesis yielding 800 mL of pleural fluid.

## 2018-12-19 IMAGING — CR DG CHEST 2V
2 series · 2 of 2 positions shown · non-contrast
Comparison: 11/25/2017

CLINICAL DATA: Pleural effusions.  Recent left thoracentesis.

EXAM:
CHEST  2 VIEW

[chest lat]
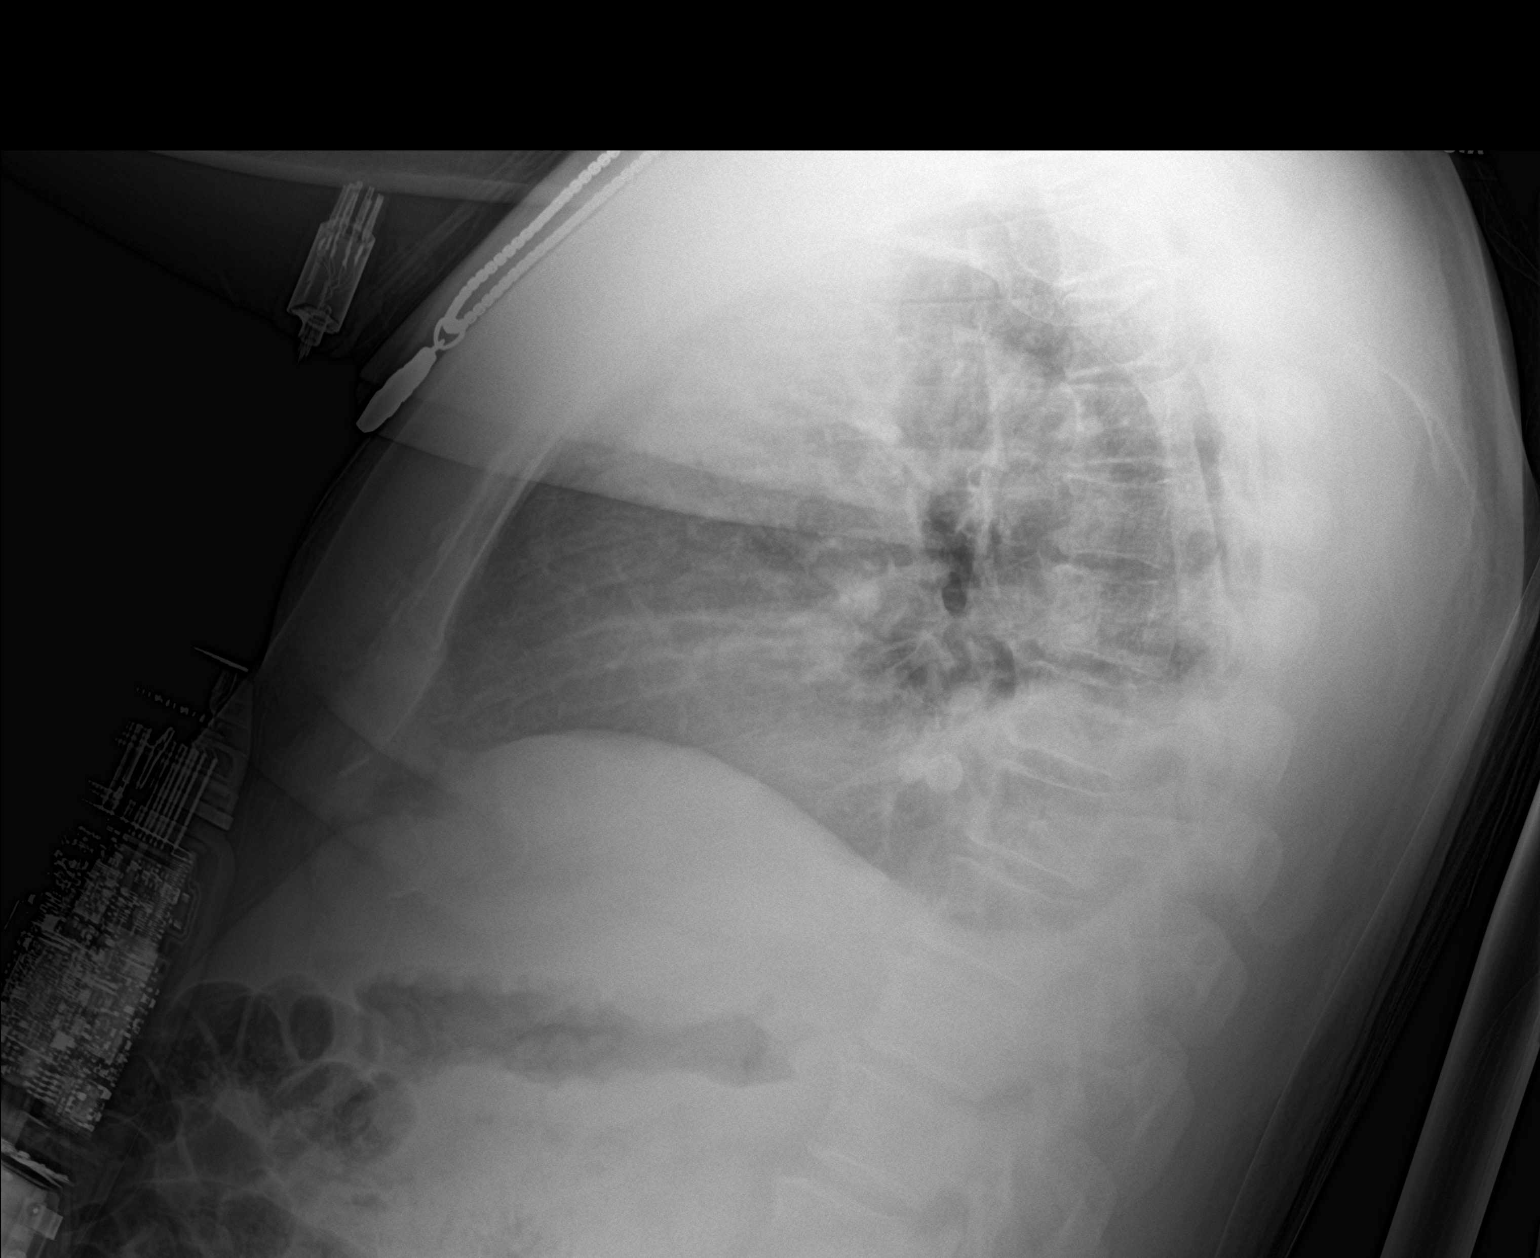

[chest ap]
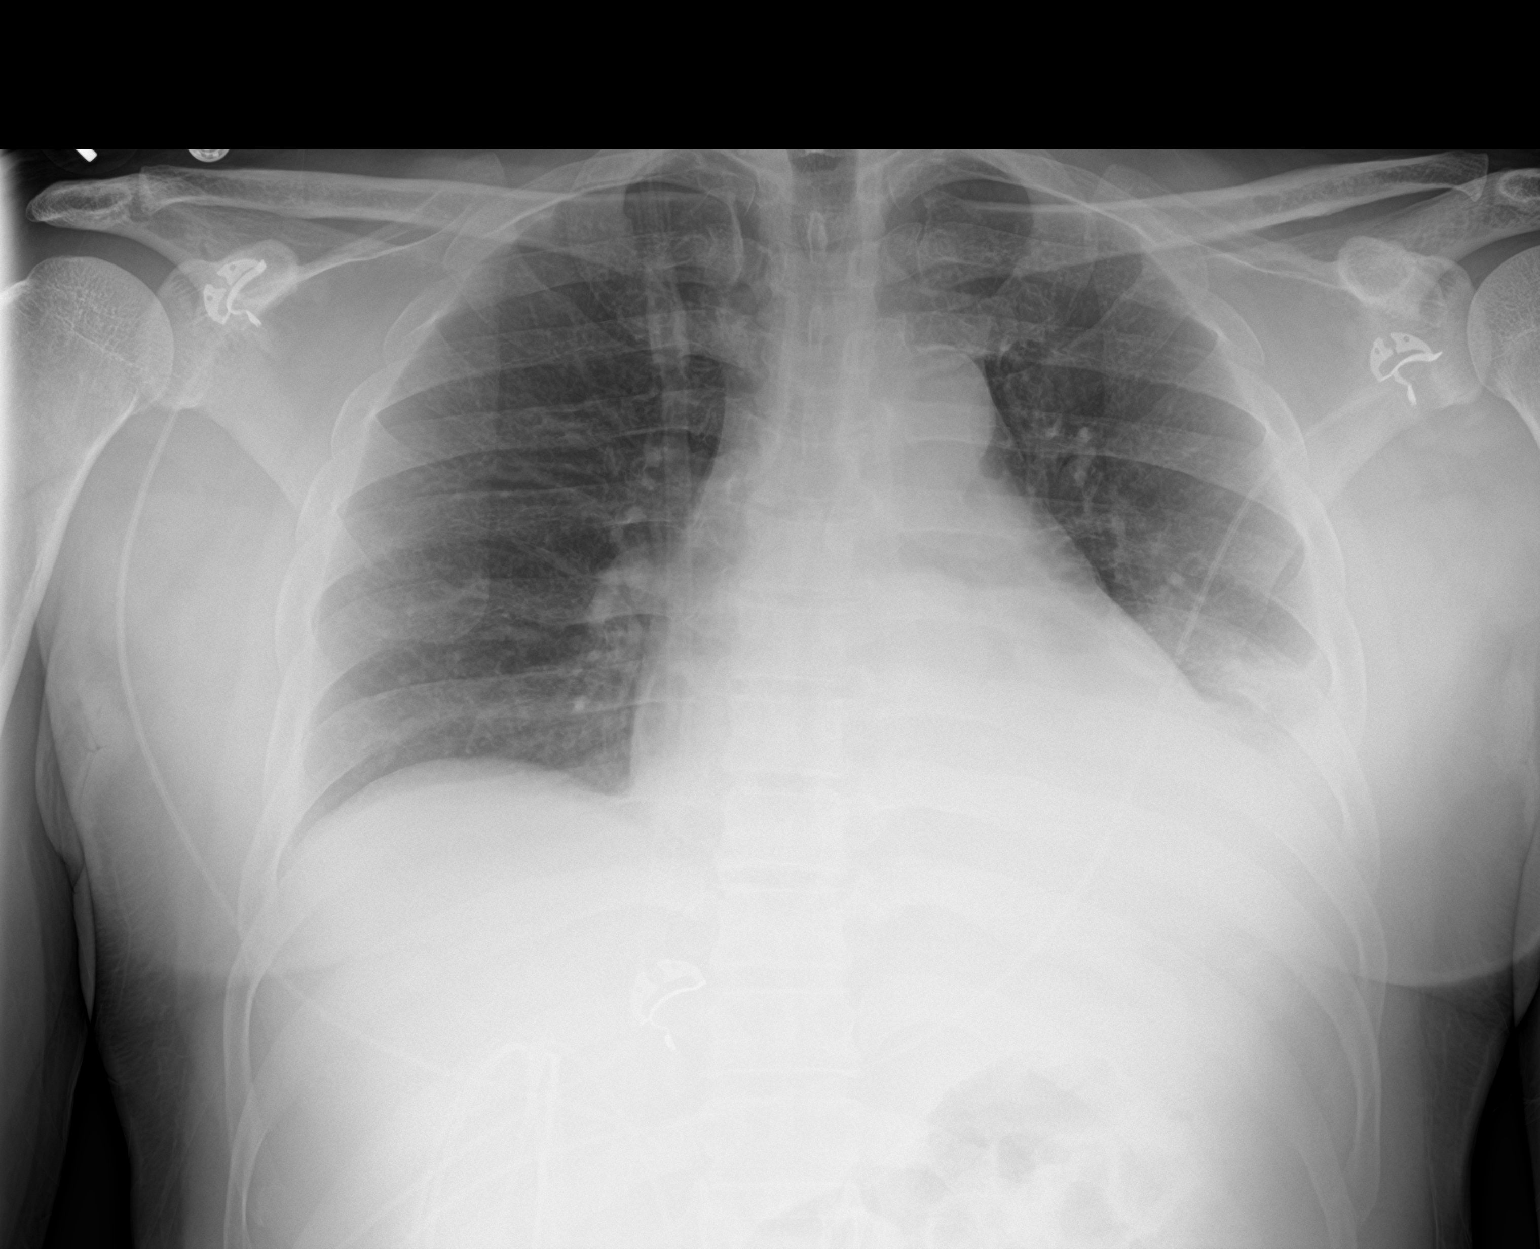

[2 of 2 positions shown; findings below may reference images not displayed]

FINDINGS: The heart size and mediastinal contours are within normal limits.
Small left pleural effusion shows no significant change in size.
There is persistent atelectasis or consolidation in the left lower
lobe. No evidence of pneumothorax. Right lung is clear.
IMPRESSION: No significant change in small left pleural effusion and left lower
lobe atelectasis versus consolidation.

## 2020-01-20 ENCOUNTER — Ambulatory Visit: Payer: Self-pay | Attending: Internal Medicine

## 2020-01-20 DIAGNOSIS — Z23 Encounter for immunization: Secondary | ICD-10-CM

## 2020-01-20 NOTE — Progress Notes (Signed)
   Covid-19 Vaccination Clinic  Name:  Scott Rowe    MRN: [id number] DOB: 11-Dec-1970  01/20/2020  [name] was observed post Covid-19 immunization for 15 minutes without incident. He was provided with Vaccine Information Sheet and instruction to access the V-Safe system.   [name] was instructed to call 911 with any severe reactions post vaccine: Marland Kitchen Difficulty breathing  . Swelling of face and throat  . A fast heartbeat  . A bad rash all over body  . Dizziness and weakness   Immunizations Administered    Name Date Dose VIS Date Route   Pfizer COVID-19 Vaccine 01/20/2020  3:36 PM 0.3 mL 10/07/2019 Intramuscular   Manufacturer: [employer], [employer]   Lot: [id number]   NDC: [address]-4

## 2020-02-15 ENCOUNTER — Ambulatory Visit: Payer: Self-pay | Attending: Internal Medicine

## 2020-02-15 DIAGNOSIS — Z23 Encounter for immunization: Secondary | ICD-10-CM

## 2020-02-15 NOTE — Progress Notes (Signed)
   Covid-19 Vaccination Clinic  Name:  Scott Rowe    MRN: [id number] DOB: 25-Oct-1971  02/15/2020  Scott Rowe was observed post Covid-19 immunization for 15 minutes without incident. He was provided with Vaccine Information Sheet and instruction to access the V-Safe system.   Scott Rowe was instructed to call 911 with any severe reactions post vaccine: Marland Kitchen Difficulty breathing  . Swelling of face and throat  . A fast heartbeat  . A bad rash all over body  . Dizziness and weakness   Immunizations Administered    Name Date Dose VIS Date Route   Pfizer COVID-19 Vaccine 02/15/2020  3:20 PM 0.3 mL 12/21/2018 Intramuscular   Manufacturer: [employer], [employer]   Lot: [id number]   NDC: [address]-0
